# Patient Record
Sex: Female | Born: 1965
Health system: Southern US, Community
[De-identification: ages and names within clinical notes are randomized; demographics above are authoritative.]

## PROBLEM LIST (undated history)

## (undated) DIAGNOSIS — E669 Obesity, unspecified: Secondary | ICD-10-CM

## (undated) DIAGNOSIS — I519 Heart disease, unspecified: Secondary | ICD-10-CM

## (undated) DIAGNOSIS — E785 Hyperlipidemia, unspecified: Secondary | ICD-10-CM

## (undated) DIAGNOSIS — I2542 Coronary artery dissection: Secondary | ICD-10-CM

## (undated) DIAGNOSIS — I10 Essential (primary) hypertension: Secondary | ICD-10-CM

## (undated) DIAGNOSIS — K219 Gastro-esophageal reflux disease without esophagitis: Secondary | ICD-10-CM

## (undated) DIAGNOSIS — I209 Angina pectoris, unspecified: Secondary | ICD-10-CM

## (undated) DIAGNOSIS — D509 Iron deficiency anemia, unspecified: Secondary | ICD-10-CM

## (undated) HISTORY — DX: Obesity, unspecified: E66.9

## (undated) HISTORY — PX: HYSTEROSCOPY W/ ENDOMETRIAL ABLATION: SUR665

## (undated) HISTORY — DX: Coronary artery dissection: I25.42

## (undated) HISTORY — DX: Iron deficiency anemia, unspecified: D50.9

## (undated) HISTORY — DX: Hyperlipidemia, unspecified: E78.5

## (undated) HISTORY — PX: TUBAL LIGATION: SHX77

## (undated) HISTORY — DX: Heart disease, unspecified: I51.9

## (undated) HISTORY — PX: CARDIAC SURGERY: SHX584

## (undated) HISTORY — PX: DILATION AND CURETTAGE OF UTERUS: SHX78

---

## 2005-09-16 ENCOUNTER — Ambulatory Visit: Payer: Self-pay | Admitting: Unknown Physician Specialty

## 2005-11-28 ENCOUNTER — Encounter: Admission: RE | Admit: 2005-11-28 | Discharge: 2005-11-28 | Payer: Self-pay | Admitting: Occupational Medicine

## 2006-12-01 ENCOUNTER — Emergency Department (HOSPITAL_COMMUNITY): Admission: EM | Admit: 2006-12-01 | Discharge: 2006-12-01 | Payer: Self-pay | Admitting: Emergency Medicine

## 2008-04-12 ENCOUNTER — Other Ambulatory Visit: Admission: RE | Admit: 2008-04-12 | Discharge: 2008-04-12 | Payer: Self-pay | Admitting: Obstetrics and Gynecology

## 2009-05-25 ENCOUNTER — Other Ambulatory Visit: Admission: RE | Admit: 2009-05-25 | Discharge: 2009-05-25 | Payer: Self-pay | Admitting: Obstetrics and Gynecology

## 2010-10-23 ENCOUNTER — Emergency Department (HOSPITAL_COMMUNITY)
Admission: EM | Admit: 2010-10-23 | Discharge: 2010-10-23 | Disposition: A | Discharge status: Other Acute Inpatient Hospital | Payer: BC Managed Care – PPO | Source: Home / Self Care | Attending: Emergency Medicine | Admitting: Emergency Medicine

## 2010-10-23 ENCOUNTER — Emergency Department (HOSPITAL_COMMUNITY): Payer: BC Managed Care – PPO

## 2010-10-23 ENCOUNTER — Other Ambulatory Visit: Payer: Self-pay

## 2010-10-23 ENCOUNTER — Inpatient Hospital Stay (HOSPITAL_COMMUNITY)
Admission: EM | Admit: 2010-10-23 | Discharge: 2010-10-29 | DRG: 546 | Disposition: A | Discharge status: Home / Self Care | Payer: BC Managed Care – PPO | Source: Other Acute Inpatient Hospital | Attending: Internal Medicine | Admitting: Internal Medicine

## 2010-10-23 ENCOUNTER — Emergency Department: Payer: Self-pay | Admitting: Emergency Medicine

## 2010-10-23 ENCOUNTER — Encounter: Payer: Self-pay | Admitting: *Deleted

## 2010-10-23 DIAGNOSIS — E669 Obesity, unspecified: Secondary | ICD-10-CM | POA: Diagnosis present

## 2010-10-23 DIAGNOSIS — T45515A Adverse effect of anticoagulants, initial encounter: Secondary | ICD-10-CM | POA: Diagnosis not present

## 2010-10-23 DIAGNOSIS — D689 Coagulation defect, unspecified: Secondary | ICD-10-CM | POA: Diagnosis not present

## 2010-10-23 DIAGNOSIS — I10 Essential (primary) hypertension: Secondary | ICD-10-CM | POA: Diagnosis present

## 2010-10-23 DIAGNOSIS — D62 Acute posthemorrhagic anemia: Secondary | ICD-10-CM | POA: Diagnosis not present

## 2010-10-23 DIAGNOSIS — I214 Non-ST elevation (NSTEMI) myocardial infarction: Secondary | ICD-10-CM | POA: Insufficient documentation

## 2010-10-23 DIAGNOSIS — D509 Iron deficiency anemia, unspecified: Secondary | ICD-10-CM | POA: Diagnosis present

## 2010-10-23 DIAGNOSIS — Z8249 Family history of ischemic heart disease and other diseases of the circulatory system: Secondary | ICD-10-CM

## 2010-10-23 DIAGNOSIS — E876 Hypokalemia: Secondary | ICD-10-CM | POA: Diagnosis present

## 2010-10-23 DIAGNOSIS — R079 Chest pain, unspecified: Secondary | ICD-10-CM

## 2010-10-23 DIAGNOSIS — E8779 Other fluid overload: Secondary | ICD-10-CM | POA: Diagnosis not present

## 2010-10-23 DIAGNOSIS — I2109 ST elevation (STEMI) myocardial infarction involving other coronary artery of anterior wall: Secondary | ICD-10-CM | POA: Diagnosis present

## 2010-10-23 DIAGNOSIS — I2542 Coronary artery dissection: Principal | ICD-10-CM | POA: Diagnosis present

## 2010-10-23 DIAGNOSIS — J45909 Unspecified asthma, uncomplicated: Secondary | ICD-10-CM | POA: Diagnosis present

## 2010-10-23 HISTORY — DX: Essential (primary) hypertension: I10

## 2010-10-23 LAB — BASIC METABOLIC PANEL
BUN: 15 mg/dL (ref 6–23)
CO2: 27 mEq/L (ref 19–32)
Calcium: 9.2 mg/dL (ref 8.4–10.5)
Glucose, Bld: 104 mg/dL — ABNORMAL HIGH (ref 70–99)
Sodium: 135 mEq/L (ref 135–145)

## 2010-10-23 LAB — CBC
MCH: 29.5 pg (ref 26.0–34.0)
MCV: 86.3 fL (ref 78.0–100.0)
Platelets: 310 10*3/uL (ref 150–400)
RBC: 3.87 MIL/uL (ref 3.87–5.11)

## 2010-10-23 LAB — DIFFERENTIAL
Eosinophils Absolute: 0.2 10*3/uL (ref 0.0–0.7)
Eosinophils Relative: 4 % (ref 0–5)
Lymphs Abs: 1.2 10*3/uL (ref 0.7–4.0)
Monocytes Relative: 10 % (ref 3–12)

## 2010-10-23 LAB — PROTIME-INR: Prothrombin Time: 13.8 seconds (ref 11.6–15.2)

## 2010-10-23 LAB — POCT I-STAT TROPONIN I

## 2010-10-23 LAB — CK TOTAL AND CKMB (NOT AT ARMC)
CK, MB: 3.3 ng/mL (ref 0.3–4.0)
Relative Index: 2.3 (ref 0.0–2.5)

## 2010-10-23 MED ORDER — ASPIRIN 81 MG PO CHEW
324.0000 mg | CHEWABLE_TABLET | Freq: Once | ORAL | Status: AC
  Administered 2010-10-23: 324 mg via ORAL
  Filled 2010-10-23: qty 4

## 2010-10-23 MED ORDER — NITROGLYCERIN 0.4 MG SL SUBL
0.4000 mg | SUBLINGUAL_TABLET | SUBLINGUAL | Status: AC | PRN
  Administered 2010-10-23 (×3): 0.4 mg via SUBLINGUAL
  Filled 2010-10-23: qty 75

## 2010-10-23 MED ORDER — HEPARIN BOLUS VIA INFUSION
5000.0000 [IU] | Freq: Once | INTRAVENOUS | Status: AC
  Administered 2010-10-23: 5000 [IU] via INTRAVENOUS
  Filled 2010-10-23: qty 5000

## 2010-10-23 MED ORDER — HEPARIN (PORCINE) IN NACL 100-0.45 UNIT/ML-% IJ SOLN
12.0000 [IU]/kg/h | INTRAMUSCULAR | Status: DC
  Administered 2010-10-23: 12 [IU]/kg/h via INTRAVENOUS
  Filled 2010-10-23: qty 250

## 2010-10-23 MED ORDER — METOPROLOL TARTRATE 1 MG/ML IV SOLN
2.5000 mg | Freq: Once | INTRAVENOUS | Status: AC
  Administered 2010-10-23: 16:00:00 via INTRAVENOUS
  Filled 2010-10-23: qty 5

## 2010-10-23 NOTE — ED Provider Notes (Addendum)
History   Scribed for Vida Roller, MD, the patient was seen in room APA11/APA11. This chart was scribed by Clarita Crane. This patient's care was started at 1:29PM.   CSN: [id number] Arrival date & time: 10/23/2010 12:54 PM  Chief Complaint  Patient presents with  . Chest Pain  . Shortness of Breath   The history is provided by the patient and the spouse.   Ashlee Stein is a 45 y.o. female who presents to the Emergency Department complaining of episodic chest pain radiating through chest to back described as pressure with associated SOB onset 2.5 hours ago. States chest pain came on suddenly while she was walking from her car to her home with episode lasting 20-30 minutes then gradually improving. Notes chest pain has resolved but is still experiencing pressure in her back. Patient reports SOB was worse with exertion. Patient denies h/o of similar symptoms. Denies cough, fever, n/v. Notes h/o hypertension, iron deficiency. Family h/o pre-mature cardiac problems. Patient is a non-drinker, non-smoker.   Past Medical History  Diagnosis Date  . Hypertension     History reviewed. No pertinent past surgical history.  Family History  Problem Relation Age of Onset  . Heart attack Sister     History  Substance Use Topics  . Smoking status: Never Smoker   . Smokeless tobacco: Not on file  . Alcohol Use: No    OB History    Grav Para Term Preterm Abortions TAB SAB Ect Mult Living                  Review of Systems 10 Systems reviewed and are negative for acute change except as noted in the HPI.  Physical Exam  BP 122/76  Pulse 65  Temp(Src) 98 F (36.7 C) (Oral)  Resp 14  Ht 5' 7.5" (1.715 m)  Wt 247 lb (112.038 kg)  BMI 38.11 kg/m2  SpO2 99%  LMP 10/20/2010  Physical Exam  Nursing note and vitals reviewed. Constitutional: She is oriented to person, place, and time. She appears well-developed and well-nourished. No distress.  HENT:  Head: Normocephalic and  atraumatic.  Eyes: Conjunctivae are normal. Pupils are equal, round, and reactive to light.  Neck: Neck supple.  Cardiovascular: Normal rate and regular rhythm.  Exam reveals no gallop and no friction rub.   No murmur heard. Pulmonary/Chest: Effort normal and breath sounds normal. She has no wheezes.       Mild reproducible chest tenderness at right rib margin below breast.   Abdominal: Soft. Bowel sounds are normal. She exhibits no distension. There is no tenderness.  Musculoskeletal: Normal range of motion. She exhibits no edema.  Neurological: She is alert and oriented to person, place, and time. No sensory deficit.  Skin: Skin is warm and dry.  Psychiatric: She has a normal mood and affect. Her behavior is normal.    ED Course  CRITICAL CARE Performed by: Eber Hong D Authorized by: Eber Hong D Total critical care time: 35 minutes Critical care time was exclusive of separately billable procedures and treating other patients and teaching time. Critical care was necessary to treat or prevent imminent or life-threatening deterioration of the following conditions: Acute Heart Attack. Critical care was time spent personally by me on the following activities: evaluation of patient's response to treatment, ordering and performing treatments and interventions, pulse oximetry, examination of patient, ordering and review of laboratory studies, re-evaluation of patient's condition, discussions with consultants, obtaining history from patient or surrogate, review of  old charts and ordering and review of radiographic studies. Comments: Heparin gtt, nitro, lopressor, cardiology consult and transfer to cardiac referral center.    MDM Patient had 30+ minutes of chest pressure with radiation to the back. Still having some mild pressure in the back. Vital signs are normal exam is otherwise unremarkable. Her EKG shows left ventricular hypertrophy but no signs of acute ischemia. Initiate  laboratory, imaging, medication workup.  Results for orders placed during the hospital encounter of 10/23/10  TROPONIN I      Component Value Range   Troponin I 0.37 (*) <0.30 (ng/mL)  BASIC METABOLIC PANEL      Component Value Range   Sodium 135  135 - 145 (mEq/L)   Potassium 3.4 (*) 3.5 - 5.1 (mEq/L)   Chloride 99  96 - 112 (mEq/L)   CO2 27  19 - 32 (mEq/L)   Glucose, Bld 104 (*) 70 - 99 (mg/dL)   BUN 15  6 - 23 (mg/dL)   Creatinine, Ser 0.34  0.50 - 1.10 (mg/dL)   Calcium 9.2  8.4 - 74.2 (mg/dL)   GFR calc non Af Amer >60  >60 (mL/min)   GFR calc Af Amer >60  >60 (mL/min)  CBC      Component Value Range   WBC 4.4  4.0 - 10.5 (K/uL)   RBC 3.87  3.87 - 5.11 (MIL/uL)   Hemoglobin 11.4 (*) 12.0 - 15.0 (g/dL)   HCT 59.5 (*) 63.8 - 46.0 (%)   MCV 86.3  78.0 - 100.0 (fL)   MCH 29.5  26.0 - 34.0 (pg)   MCHC 34.1  30.0 - 36.0 (g/dL)   RDW 75.6  43.3 - 29.5 (%)   Platelets 310  150 - 400 (K/uL)  DIFFERENTIAL      Component Value Range   Neutrophils Relative 59  43 - 77 (%)   Neutro Abs 2.6  1.7 - 7.7 (K/uL)   Lymphocytes Relative 27  12 - 46 (%)   Lymphs Abs 1.2  0.7 - 4.0 (K/uL)   Monocytes Relative 10  3 - 12 (%)   Monocytes Absolute 0.4  0.1 - 1.0 (K/uL)   Eosinophils Relative 4  0 - 5 (%)   Eosinophils Absolute 0.2  0.0 - 0.7 (K/uL)   Basophils Relative 1  0 - 1 (%)   Basophils Absolute 0.0  0.0 - 0.1 (K/uL)  PROTIME-INR      Component Value Range   Prothrombin Time 13.8  11.6 - 15.2 (seconds)   INR 1.04  0.00 - 1.49   APTT      Component Value Range   aPTT 30  24 - 37 (seconds)  POCT I-STAT TROPONIN I      Component Value Range   Troponin i, poc 0.17 (*) 0.00 - 0.08 (ng/mL)   Comment NOTIFIED PHYSICIAN     Comment 3             Dg Chest 2 View  10/23/2010  *RADIOLOGY REPORT*  Clinical Data: Chest pain, nonsmoker  CHEST - 2 VIEW  Comparison: 12/01/2006  Findings: Lungs are clear. No pleural effusion or pneumothorax.  Cardiomediastinal silhouette is within normal  limits.  Mild degenerative changes of the visualized thoracolumbar spine.  IMPRESSION: Normal chest radiographs.  Original Report Authenticated By: Charline Bills, M.D.     ED ECG REPORT   Date: 10/23/2010 13:00 PM  Rate: 63  Rhythm: normal sinus rhythm  QRS Axis: normal  Intervals: normal  ST/T Wave abnormalities:  normal  Conduction Disutrbances:none  Narrative Interpretation: LVH  Old EKG Reviewed: none available  ED ECG REPORT   Date: 10/23/2010 15:46  Rate: 86  Rhythm: normal sinus rhythm  QRS Axis: normal  Intervals: normal  ST/T Wave abnormalities: normal  Conduction Disutrbances:none  Narrative Interpretation: no change  Old EKG Reviewed: unchanged  Pt reexamined and has minimal back pain at this time - states the back pain got a little worse with standing for her CXR -no CP - repeat EKG without acute findings, Trop elevated c/w ACS.  CC provided,  Heparin ordered, cardiology consulrted for transfer to referral center.  D/w Dr. Gala Romney with Cardiology at [facility] - agrees with heparin and lopressor 2.5 at his request.  CC provided.  I personally performed the services described in this documentation, which was scribed in my presence. The recorded information has been reviewed and considered. Vida Roller, MD

## 2010-10-23 NOTE — ED Notes (Signed)
Chest pain that started today, pt states "the pain came from out of nowhere", sob when the pain comes, pt states that the pain comes and goes, pain does radiate to right arm

## 2010-10-24 ENCOUNTER — Inpatient Hospital Stay (HOSPITAL_COMMUNITY): Payer: BC Managed Care – PPO

## 2010-10-24 DIAGNOSIS — I2510 Atherosclerotic heart disease of native coronary artery without angina pectoris: Secondary | ICD-10-CM

## 2010-10-24 DIAGNOSIS — I2542 Coronary artery dissection: Secondary | ICD-10-CM

## 2010-10-24 HISTORY — PX: CORONARY ARTERY BYPASS GRAFT: SHX141

## 2010-10-24 HISTORY — DX: Coronary artery dissection: I25.42

## 2010-10-24 LAB — POCT I-STAT 4, (NA,K, GLUC, HGB,HCT)
Glucose, Bld: 96 mg/dL (ref 70–99)
Glucose, Bld: 117 mg/dL — ABNORMAL HIGH (ref 70–99)
Glucose, Bld: 130 mg/dL — ABNORMAL HIGH (ref 70–99)
HCT: 25 % — ABNORMAL LOW (ref 36.0–46.0)
HCT: 26 % — ABNORMAL LOW (ref 36.0–46.0)
HCT: 27 % — ABNORMAL LOW (ref 36.0–46.0)
Hemoglobin: 8.5 g/dL — ABNORMAL LOW (ref 12.0–15.0)
Hemoglobin: 8.5 g/dL — ABNORMAL LOW (ref 12.0–15.0)
Potassium: 3.3 mEq/L — ABNORMAL LOW (ref 3.5–5.1)
Potassium: 3.8 mEq/L (ref 3.5–5.1)
Potassium: 4.6 mEq/L (ref 3.5–5.1)
Sodium: 136 mEq/L (ref 135–145)
Sodium: 136 mEq/L (ref 135–145)

## 2010-10-24 LAB — POCT I-STAT 3, ART BLOOD GAS (G3+)
Acid-base deficit: 1 mmol/L (ref 0.0–2.0)
Acid-base deficit: 2 mmol/L (ref 0.0–2.0)
Bicarbonate: 23.1 mEq/L (ref 20.0–24.0)
Bicarbonate: 23.8 mEq/L (ref 20.0–24.0)
Bicarbonate: 24.2 mEq/L — ABNORMAL HIGH (ref 20.0–24.0)
O2 Saturation: 100 %
O2 Saturation: 98 %
TCO2: 24 mmol/L (ref 0–100)
TCO2: 26 mmol/L (ref 0–100)
pCO2 arterial: 46.2 mmHg — ABNORMAL HIGH (ref 35.0–45.0)
pH, Arterial: 7.327 — ABNORMAL LOW (ref 7.350–7.400)
pO2, Arterial: 106 mmHg — ABNORMAL HIGH (ref 80.0–100.0)
pO2, Arterial: 308 mmHg — ABNORMAL HIGH (ref 80.0–100.0)
pO2, Arterial: 351 mmHg — ABNORMAL HIGH (ref 80.0–100.0)

## 2010-10-24 LAB — BASIC METABOLIC PANEL
Calcium: 9 mg/dL (ref 8.4–10.5)
Creatinine, Ser: 0.9 mg/dL (ref 0.50–1.10)
GFR calc non Af Amer: >60 mL/min (ref >60)
Glucose, Bld: 115 mg/dL — ABNORMAL HIGH (ref 70–99)
Sodium: 137 mEq/L (ref 135–145)

## 2010-10-24 LAB — TROPONIN I
Troponin I: <0.3 ng/mL (ref <0.30)
Troponin I: <0.3 ng/mL (ref <0.30)

## 2010-10-24 LAB — PLATELET COUNT: Platelets: 195 10*3/uL (ref 150–400)

## 2010-10-24 LAB — LIPID PANEL
LDL Cholesterol: 108 mg/dL — ABNORMAL HIGH (ref 0–99)
Triglycerides: 93 mg/dL (ref <150)

## 2010-10-24 LAB — PREGNANCY, URINE: Preg Test, Ur: NEGATIVE

## 2010-10-24 LAB — HEMOGLOBIN A1C
Hgb A1c MFr Bld: 5.8 % — ABNORMAL HIGH (ref <5.7)
Mean Plasma Glucose: 120 mg/dL — ABNORMAL HIGH (ref <117)

## 2010-10-24 LAB — HEMOGLOBIN AND HEMATOCRIT, BLOOD: HCT: 26.2 % — ABNORMAL LOW (ref 36.0–46.0)

## 2010-10-24 LAB — HEPARIN LEVEL (UNFRACTIONATED): Heparin Unfractionated: 0.52 IU/mL (ref 0.30–0.70)

## 2010-10-24 LAB — CBC
HCT: 32.4 % — ABNORMAL LOW (ref 36.0–46.0)
Hemoglobin: 10.9 g/dL — ABNORMAL LOW (ref 12.0–15.0)
RDW: 14.3 % (ref 11.5–15.5)
WBC: 5.1 10*3/uL (ref 4.0–10.5)

## 2010-10-24 LAB — CK TOTAL AND CKMB (NOT AT ARMC)
CK, MB: 3.2 ng/mL (ref 0.3–4.0)
Total CK: 136 U/L (ref 7–177)
Total CK: 128 U/L (ref 7–177)

## 2010-10-25 ENCOUNTER — Inpatient Hospital Stay (HOSPITAL_COMMUNITY): Payer: BC Managed Care – PPO

## 2010-10-25 DIAGNOSIS — IMO0001 Reserved for inherently not codable concepts without codable children: Secondary | ICD-10-CM

## 2010-10-25 DIAGNOSIS — E1165 Type 2 diabetes mellitus with hyperglycemia: Secondary | ICD-10-CM

## 2010-10-25 DIAGNOSIS — I319 Disease of pericardium, unspecified: Secondary | ICD-10-CM

## 2010-10-25 LAB — CBC
HCT: 26.9 % — ABNORMAL LOW (ref 36.0–46.0)
HCT: 26 % — ABNORMAL LOW (ref 36.0–46.0)
HCT: 27.5 % — ABNORMAL LOW (ref 36.0–46.0)
Hemoglobin: 9.5 g/dL — ABNORMAL LOW (ref 12.0–15.0)
Hemoglobin: 9 g/dL — ABNORMAL LOW (ref 12.0–15.0)
Hemoglobin: 9.6 g/dL — ABNORMAL LOW (ref 12.0–15.0)
MCH: 30 pg (ref 26.0–34.0)
MCH: 29.3 pg (ref 26.0–34.0)
MCH: 29.4 pg (ref 26.0–34.0)
MCHC: 35.3 g/dL (ref 30.0–36.0)
MCHC: 34.6 g/dL (ref 30.0–36.0)
MCHC: 34.9 g/dL (ref 30.0–36.0)
MCV: 84.9 fL (ref 78.0–100.0)
MCV: 84.7 fL (ref 78.0–100.0)
MCV: 84.4 fL (ref 78.0–100.0)
Platelets: 165 10*3/uL (ref 150–400)
Platelets: 227 10*3/uL (ref 150–400)
Platelets: 255 10*3/uL (ref 150–400)
RBC: 3.17 MIL/uL — ABNORMAL LOW (ref 3.87–5.11)
RBC: 3.07 MIL/uL — ABNORMAL LOW (ref 3.87–5.11)
RBC: 3.26 MIL/uL — ABNORMAL LOW (ref 3.87–5.11)
RDW: 14 % (ref 11.5–15.5)
RDW: 14.1 % (ref 11.5–15.5)
RDW: 14.4 % (ref 11.5–15.5)
WBC: 12.9 10*3/uL — ABNORMAL HIGH (ref 4.0–10.5)
WBC: 11.7 10*3/uL — ABNORMAL HIGH (ref 4.0–10.5)
WBC: 12.2 10*3/uL — ABNORMAL HIGH (ref 4.0–10.5)

## 2010-10-25 LAB — CREATININE, SERUM
Creatinine, Ser: 0.9 mg/dL (ref 0.50–1.10)
GFR calc Af Amer: >60 mL/min (ref >60)
GFR calc non Af Amer: >60 mL/min (ref >60)

## 2010-10-25 LAB — POCT I-STAT, CHEM 8
BUN: 6 mg/dL (ref 6–23)
BUN: 9 mg/dL (ref 6–23)
Calcium, Ion: 1.06 mmol/L — ABNORMAL LOW (ref 1.12–1.32)
Chloride: 102 mEq/L (ref 96–112)
Creatinine, Ser: 0.8 mg/dL (ref 0.50–1.10)
Creatinine, Ser: 0.9 mg/dL (ref 0.50–1.10)
Glucose, Bld: 161 mg/dL — ABNORMAL HIGH (ref 70–99)
Potassium: 3.5 mEq/L (ref 3.5–5.1)
Sodium: 139 mEq/L (ref 135–145)
TCO2: 22 mmol/L (ref 0–100)

## 2010-10-25 LAB — CARDIAC PANEL(CRET KIN+CKTOT+MB+TROPI)
CK, MB: 11 ng/mL (ref 0.3–4.0)
Relative Index: 3.4 — ABNORMAL HIGH (ref 0.0–2.5)
Total CK: 325 U/L — ABNORMAL HIGH (ref 7–177)
Troponin I: 3.97 ng/mL (ref <0.30)

## 2010-10-25 LAB — POCT I-STAT 3, ART BLOOD GAS (G3+)
Acid-base deficit: 1 mmol/L (ref 0.0–2.0)
Acid-base deficit: 1 mmol/L (ref 0.0–2.0)
Bicarbonate: 22.8 mEq/L (ref 20.0–24.0)
Bicarbonate: 23.5 mEq/L (ref 20.0–24.0)
Bicarbonate: 23.9 mEq/L (ref 20.0–24.0)
O2 Saturation: 99 %
Patient temperature: 35.5
Patient temperature: 97.5
TCO2: 24 mmol/L (ref 0–100)
pCO2 arterial: 40.3 mmHg (ref 35.0–45.0)
pH, Arterial: 7.376 (ref 7.350–7.400)
pO2, Arterial: 98 mmHg (ref 80.0–100.0)
pO2, Arterial: 102 mmHg — ABNORMAL HIGH (ref 80.0–100.0)

## 2010-10-25 LAB — GLUCOSE, CAPILLARY
Glucose-Capillary: 113 mg/dL — ABNORMAL HIGH (ref 70–99)
Glucose-Capillary: 100 mg/dL — ABNORMAL HIGH (ref 70–99)
Glucose-Capillary: 119 mg/dL — ABNORMAL HIGH (ref 70–99)
Glucose-Capillary: 112 mg/dL — ABNORMAL HIGH (ref 70–99)
Glucose-Capillary: 120 mg/dL — ABNORMAL HIGH (ref 70–99)
Glucose-Capillary: 111 mg/dL — ABNORMAL HIGH (ref 70–99)
Glucose-Capillary: 135 mg/dL — ABNORMAL HIGH (ref 70–99)
Glucose-Capillary: 145 mg/dL — ABNORMAL HIGH (ref 70–99)
Glucose-Capillary: 122 mg/dL — ABNORMAL HIGH (ref 70–99)

## 2010-10-25 LAB — BASIC METABOLIC PANEL
BUN: 8 mg/dL (ref 6–23)
CO2: 25 mEq/L (ref 19–32)
Calcium: 7.3 mg/dL — ABNORMAL LOW (ref 8.4–10.5)
Chloride: 108 mEq/L (ref 96–112)
Creatinine, Ser: 0.78 mg/dL (ref 0.50–1.10)
GFR calc Af Amer: >60 mL/min (ref >60)
GFR calc non Af Amer: >60 mL/min (ref >60)
Glucose, Bld: 117 mg/dL — ABNORMAL HIGH (ref 70–99)
Potassium: 3.9 mEq/L (ref 3.5–5.1)
Sodium: 138 mEq/L (ref 135–145)

## 2010-10-25 LAB — MRSA PCR SCREENING: MRSA by PCR: NEGATIVE

## 2010-10-25 LAB — MAGNESIUM
Magnesium: 2.7 mg/dL — ABNORMAL HIGH (ref 1.5–2.5)
Magnesium: 2.5 mg/dL (ref 1.5–2.5)

## 2010-10-25 LAB — APTT: aPTT: 35 seconds (ref 24–37)

## 2010-10-25 LAB — PROTIME-INR
INR: 1.47 (ref 0.00–1.49)
Prothrombin Time: 18.1 seconds — ABNORMAL HIGH (ref 11.6–15.2)

## 2010-10-26 ENCOUNTER — Inpatient Hospital Stay (HOSPITAL_COMMUNITY): Payer: BC Managed Care – PPO

## 2010-10-26 LAB — GLUCOSE, CAPILLARY
Glucose-Capillary: 128 mg/dL — ABNORMAL HIGH (ref 70–99)
Glucose-Capillary: 139 mg/dL — ABNORMAL HIGH (ref 70–99)
Glucose-Capillary: 126 mg/dL — ABNORMAL HIGH (ref 70–99)

## 2010-10-26 LAB — BASIC METABOLIC PANEL
CO2: 27 mEq/L (ref 19–32)
Chloride: 102 mEq/L (ref 96–112)
Creatinine, Ser: 0.97 mg/dL (ref 0.50–1.10)

## 2010-10-26 LAB — PREPARE FRESH FROZEN PLASMA
Unit division: 0
Unit division: 0

## 2010-10-26 LAB — PREPARE PLATELET PHERESIS

## 2010-10-26 LAB — HEMOGLOBIN A1C
Hgb A1c MFr Bld: 5.4 % (ref <5.7)
Mean Plasma Glucose: 108 mg/dL (ref <117)

## 2010-10-26 LAB — CBC
MCV: 85.7 fL (ref 78.0–100.0)
Platelets: 246 10*3/uL (ref 150–400)
RBC: 3.01 MIL/uL — ABNORMAL LOW (ref 3.87–5.11)
WBC: 12.2 10*3/uL — ABNORMAL HIGH (ref 4.0–10.5)

## 2010-10-26 LAB — MAGNESIUM: Magnesium: 2.6 mg/dL — ABNORMAL HIGH (ref 1.5–2.5)

## 2010-10-27 ENCOUNTER — Inpatient Hospital Stay (HOSPITAL_COMMUNITY): Payer: BC Managed Care – PPO

## 2010-10-27 LAB — CBC
HCT: 23.9 % — ABNORMAL LOW (ref 36.0–46.0)
Hemoglobin: 8.2 g/dL — ABNORMAL LOW (ref 12.0–15.0)
MCH: 29.7 pg (ref 26.0–34.0)
MCHC: 34.3 g/dL (ref 30.0–36.0)
MCV: 86.6 fL (ref 78.0–100.0)
Platelets: 247 10*3/uL (ref 150–400)
RBC: 2.76 MIL/uL — ABNORMAL LOW (ref 3.87–5.11)
RDW: 15.2 % (ref 11.5–15.5)
WBC: 10.6 10*3/uL — ABNORMAL HIGH (ref 4.0–10.5)

## 2010-10-27 LAB — BASIC METABOLIC PANEL
BUN: 16 mg/dL (ref 6–23)
CO2: 25 mEq/L (ref 19–32)
Calcium: 8.2 mg/dL — ABNORMAL LOW (ref 8.4–10.5)
Chloride: 101 mEq/L (ref 96–112)
Creatinine, Ser: 0.93 mg/dL (ref 0.50–1.10)
GFR calc Af Amer: >60 mL/min (ref >60)
GFR calc non Af Amer: >60 mL/min (ref >60)
Glucose, Bld: 122 mg/dL — ABNORMAL HIGH (ref 70–99)
Potassium: 3.7 mEq/L (ref 3.5–5.1)
Sodium: 135 mEq/L (ref 135–145)

## 2010-10-27 LAB — TYPE AND SCREEN
Unit division: 0
Unit division: 0

## 2010-10-27 LAB — GLUCOSE, CAPILLARY
Glucose-Capillary: 118 mg/dL — ABNORMAL HIGH (ref 70–99)
Glucose-Capillary: 128 mg/dL — ABNORMAL HIGH (ref 70–99)
Glucose-Capillary: 99 mg/dL (ref 70–99)
Glucose-Capillary: 104 mg/dL — ABNORMAL HIGH (ref 70–99)

## 2010-10-29 LAB — BASIC METABOLIC PANEL
BUN: 15 mg/dL (ref 6–23)
Chloride: 104 mEq/L (ref 96–112)
Creatinine, Ser: 0.91 mg/dL (ref 0.50–1.10)
GFR calc Af Amer: >60 mL/min (ref >60)

## 2010-11-06 ENCOUNTER — Telehealth: Payer: Self-pay | Admitting: Internal Medicine

## 2010-11-06 NOTE — Cardiovascular Report (Signed)
NAME:  Ashlee Stein, Ashlee Stein        ACCOUNT NO.:  [id number]  MEDICAL RECORD NO.:  [id number]  LOCATION:  2399                         FACILITY:  [facility]  PHYSICIAN:  Arturo Morton. Riley Kill, MD, FACCDATE OF BIRTH:  January 02, 1966  DATE OF PROCEDURE:  10/24/2010 DATE OF DISCHARGE:                           CARDIAC CATHETERIZATION   INDICATIONS:  The patient presented with chest pain and had elevation of her troponin's.  She has no definite risk factors.  As a result, urgent catheterization was recommended by Dr. Gala Romney and Clifton James.  PROCEDURES: 1. Left heart catheterization. 2. Selective coronary arteriography. 3. Selective left ventriculography. 4. Attempted percutaneous coronary intervention of the left anterior     descending artery.  DESCRIPTION OF PROCEDURE:  The patient was brought to the catheterization laboratory and prepped and draped in the usual fashion. Through an anterior puncture, the right radial artery was entered.  A 5- French sheath was initially placed.  Views of the right and left coronary arteries were obtained in multiple angiographic projections. We were unable to cross the aortic valve.  I stopped at this point, and I called Dr. Excell Seltzer and Dr. Clifton James in the office.  They reviewed the films with me.  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  There is slight hypodensity up around the first turn in a highly tortuous LAD.  We discussed the various options which included watchful waiting versus an attempt across this area and placed the stent.  We also discussed the possibility of revascularization surgery, although the distal vessel is of moderate size, being probably about a 2.25-mm artery, although it does cross the apical tip.  We have had a moderate experience with spontaneous coronary artery dissection, with proximal progressive extension of hematoma,  and with this we felt the best option might be to see if we could cross the distal vessel with a wire, and potentially place a long drug-eluting stent in this portion of the vessel.  I discussed this with the patient, then subsequently went out and spoke with her family.  Preparations were made.  We specifically did not give her Brillianta or prasugrel, in part because of the clear-cut tortuosity and the potential need for revascularization surgery.  We thought we would wait until we were able to cross with the wire.  The patient had been treated with aspirin. Bivalirudin was given according to protocol and ACT was checked and thought to be appropriate for coronary artery intervention.  We were able to get a traverse wire down into the LAD, but because of the excessive tortuosity it was somewhat difficult to move.  We placed in extension wire, and subsequently placed an over the wire balloon 2-mm in length down into the midportion of the vessel.  This gave additional support and we had to reshape the wire and a long traverse wire was then placed with a bend on the tip.  With this, we were able to get down to and about the area of the dissection and down in this, although we had some difficulty then advancing beyond the area of the clear-cut spontaneous dissection which appeared to involve 2 planes with separation  of the lumen.  We tried to move the balloon around the excessive tortuosity.  No balloon inflations were performed.  Dr. Excell Seltzer came over from the office and we continued to review options.  The patient remained pain free throughout the procedure, and there was no ST elevation with the size of the distal vessel.  However, there was a modest apical vessel and I asked Dr. Kathlee Nations Trigt to come over and review the films with [place].  Although, the distal LAD is relatively small, it was apparent that we would have a difficult time getting a stent down the artery even if we were to  successfully cross.  There was also evidence of some more proximal dissection related to movement of the balloon in the area of tortuosity.  With some decremental flow, the patient remained pain free.  Dr. Excell Seltzer, Dr. Donata Clay and I discussed the options and Dr. Donata Clay felt that taking her to the operating room and grafting the distal LAD, still remained a good option.  As a result, we elected to proceed on with emergent revascularization surgery.  The patient remained stable and is being transferred.  Her 6-French radial sheath was sewn into place as we felt that the TR band would be difficult to gradually remove in the operating room on full dose heparin.  I will discuss this with the operating room team.  I reviewed the findings in detail with her mother and sister, they came back to the laboratory to see the patient prior to her transfer to the operating room.  HEMODYNAMIC DATA:  The central aortic pressure is 112/76 with a mean of 94.  ANGIOGRAPHIC DATA: 1. The right coronary artery has moderate amount of tortuosity, but is     free of critical disease. 2. The left main is relatively short appears to be free of critical     disease. 3. The left anterior descending artery courses to the apex.  The left     anterior descending artery has a near 90 degrees turn coming out of     the left main, and then has several bends prior to the area where     the disease is.  In the junction of the mid and distal vessel,     there is a fairly long area which starts with hypodensity at the     bend, this is followed by a long area of probably of about 25-30 mm     of spontaneously dissected vessel.  The distal end of this, there     is evidence of plain separation of the artery.  Beyond this, the     vessel actually opens up as about 2-mm artery and wraps the apical     tip.  The distal vessel, albeit relatively small at the apical tip     does appear to be a reasonable target for  grafting.  Following     attempts to get the wire down, there was decremental flow, and     evidence of more proximal dissection above the long area of     original plain, but the distal apical portion of the vessel     appeared to remain intact.  The dissection in the midvessel was     advanced beyond.  The findings at the origin of the case with more     proximal vessel was unaffected. 4. The circumflex coronary artery provides 2 large marginal branches     and the posterolateral  branch is free of critical disease.  CONCLUSIONS: 1. 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 2. Unsuccessful attempt to cross the distal LAD due to excessive     tortuosity and dissection with more proximal extension of the     dissection into the midvessel.  DISPOSITION:  The patient remained stable and without chest pain. Nonetheless, she has a clear-cut plain separation in her mid to distal portion of left anterior descending artery with a fairly large apical vessel.  What was clear from the attempt at percutaneous intervention was that we would be unsuccessful in getting a balloon down around the corner.  With these findings, we felt that the best option would be to go ahead as the patient is stable and allow the surgeons to revascularize the apical portion of the left anterior descending artery. Dr. Donata Clay thought this was possible, and thought it would be the optimal therapy.  Of note, our experience with the spontaneous dissection is that the hematoma has a tendency to extend proximally complicating the more proximal vessel and, therefore, maintaining the integrity of the apical vessel would be important.     Arturo Morton. Riley Kill, MD, [facility]    TDS/MEDQ  D:  10/24/2010  T:  10/24/2010  Job:  [id number]  Electronically Signed by Shawnie Pons MD [facility] on 11/06/2010 06:09:35 PM

## 2010-11-06 NOTE — Telephone Encounter (Signed)
Calling to confirm receipt of fax for cardiac rehab.  I confirmed that we have received it.

## 2010-11-06 NOTE — Telephone Encounter (Signed)
Pharmacy calling from [place]. Please return call.  Claim # [id number]

## 2010-11-08 NOTE — Telephone Encounter (Signed)
Pt calling needing refill of Tramadol 50mg  in [facility]. Please call in RX.

## 2010-11-08 NOTE — Telephone Encounter (Signed)
Not sure if Dr. Jones Broom refills Leotis Shames will send to his nurse

## 2010-11-12 NOTE — Telephone Encounter (Signed)
Pt states it has been refilled

## 2010-11-13 ENCOUNTER — Encounter: Payer: Self-pay | Admitting: Physician Assistant

## 2010-11-13 NOTE — H&P (Signed)
NAME:  Ashlee Stein, Ashlee Stein        ACCOUNT NO.:  [id number]  MEDICAL RECORD NO.:  [id number]  LOCATION:  2609                         FACILITY:  [facility]  PHYSICIAN:  Bevelyn Buckles. Dayron Odland, MDDATE OF BIRTH:  [birth year]  DATE OF ADMISSION:  10/23/2010 DATE OF DISCHARGE:                             HISTORY & PHYSICAL   PRIMARY CARDIOLOGIST:  New patient to [facility].  PRIMARY CARE PROVIDER:  Tesfaye D. Felecia Shelling, MD  CHIEF COMPLAINT:  Chest pain.  HISTORY OF PRESENT ILLNESS:  This is a 45 year old African American female with history of hypertension treated for the last five years as well as obesity and iron deficiency anemia, who presented to [facility] Emergency Department with complaints of acute onset of chest pain.  The patient states she had walked from her car back into her house and had acute onset of substernal chest pressure.  This feeling was associated with shortness of breath and diaphoresis.  She denies nausea or vomiting.  The pain was resolving on its own after 30-40 minutes of rest.  She has a positive family history for coronary artery disease; therefore, her husband brought her today to [facility] Emergency Department for further evaluation.  At time of presentation, the patient's chest pain had almost resolved, but some mild complaints of back pain remained, therefore the patient was given sublingual nitroglycerin.  This caused a headache, but not much  difference in her pain.  EKG showed no acute changes.  Troponin was mildly elevated at 0.37, therefore the patient was given a heparin bolus and placed on IV heparin, aspirin 324 mg, and Lopressor.  She has since been transferred to [facility] for possible cardiac catheterization.  While interviewing the patient, she currently is pain-free.  She states that she has not had an episode like this before today.  With further discussion, it appears she has been having intermittent pain under her right breast for the  last several months that she felt was related to the underwire in her bra.  She cannot correlate this pain with activity at this time.  The patient denies any recent fevers or chills, syncope, or change in bowel habits. Cardiology has been asked to admit the patient for further evaluation.  The patient states she is fairly active without complaints of chest pain.  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  PAST MEDICAL HISTORY: 1. Hypertension, treated for the last five years. 2. Iron deficiency anemia, on iron supplementation. 3. Obesity. 4. Status post wisdom teeth extraction.  SOCIAL HISTORY:  The patient lives with her husband and son in [place].  Her daughter is in college at [employer].  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  The patient denies tobacco or alcohol, as well as no illicit drug use.  FAMILY HISTORY:  Pertinent for premature coronary artery disease in her father, he passed away at the age of 82 from myocardial infarction.  She has a sibling who passed away at the age of 52 from congestive heart failure as well as a notable murmur, full details are not available. Her mother is alive with known  coronary artery disease and stents in her 45s.  ALLERGIES:  NO KNOWN DRUG ALLERGIES.  HOME MEDICATIONS: 1. Tylenol 500 mg p.r.n. 2. Ferrous sulfate 325 mg daily. 3. Lisinopril - HCTZ 20 - 25 mg 1 tablet daily. 4. Visine daily.  REVIEW OF SYSTEMS:  All pertinent positives and negatives as stated in HPI.  Other systems have been reviewed and are negative.  PHYSICAL EXAM:  VITAL SIGNS:  Pulse 65, respirations 14, blood pressure 116/56, O2 saturation 98% on room air. GENERAL:  This is an anxious-appearing young lady.  She is in no acute distress. HEENT:  Normal. NECK:  Supple without bruit or JVD. HEART:  Regular rate and rhythm with S1-S2.  No murmurs,  rubs, or gallop noted.  PMI is normal.  Pulses 2+ and equal bilaterally.  LUNGS:  Clear to auscultation bilaterally without wheezes, rales, or rhonchi. ABDOMEN: Soft, nontender, positive bowel sounds x4, obese. EXTREMITIES:  No clubbing or cyanosis.  There is trace bilateral lower extremity edema. MUSCULOSKELETAL:  No joint deformities or effusions. NEUROLOGIC:  Alert and oriented x3.  Cranial nerves II-XII grossly intact.  LABORATORY DATA:  Chest x-ray at [facility] without acute cardiopulmonary process.  EKG demonstrated normal sinus rhythm at a rate of 63 beats per minute with no acute changes, but nonspecific ST - T- wave changes.  Labs; WBC of 4.4, hemoglobin 11.4, hematocrit 33.4, platelet 310.  Sodium 135, potassium 3.4, BUN of 15, creatinine 0.95, INR 1.04, troponin I 0.37.  ASSESSMENT/PLAN:  This is a 46 year old African American female with history of hypertension and iron deficiency anemia who presents with several weeks' history of right breast/chest pain followed by profound episode of chest pain today.  This is worrisome for an acute coronary syndrome versus questionable coronary dissection, although the patient is currently chest pain-free.  The patient's EKG is without acute changes, but with mildly elevated troponin.  At this time, we feel the patient will need to proceed with cardiac catheterization in the morning, possibly sooner if her chest pain reappears.  Risks, benefits, and indications for cardiac catheterization were discussed with the patient in her family and they agreed to proceed.  At this time, she will be continued on heparin, aspirin, beta-blocker, and a low-dose statin will be added.  Further treatment will be dependent upon the above results.     Leonette Monarch, PA-C   ______________________________ Bevelyn Buckles. Alazay Leicht, MD    NB/MEDQ  D:  10/23/2010  T:  10/23/2010  Job:  [id number]  Electronically Signed by Alen Blew P.A. on  10/30/2010 11:37:10 AM Electronically Signed by Arvilla Meres MD on 11/13/2010 10:39:19 PM

## 2010-11-14 ENCOUNTER — Ambulatory Visit (INDEPENDENT_AMBULATORY_CARE_PROVIDER_SITE_OTHER)
Admission: RE | Admit: 2010-11-14 | Discharge: 2010-11-14 | Disposition: A | Discharge status: Home / Self Care | Payer: BC Managed Care – PPO | Source: Ambulatory Visit | Attending: Physician Assistant | Admitting: Physician Assistant

## 2010-11-14 ENCOUNTER — Ambulatory Visit (INDEPENDENT_AMBULATORY_CARE_PROVIDER_SITE_OTHER): Payer: BC Managed Care – PPO | Admitting: Physician Assistant

## 2010-11-14 ENCOUNTER — Encounter: Payer: Self-pay | Admitting: Physician Assistant

## 2010-11-14 VITALS — BP 130/88 | HR 83 | Ht 67.0 in | Wt 247.0 lb

## 2010-11-14 DIAGNOSIS — I10 Essential (primary) hypertension: Secondary | ICD-10-CM

## 2010-11-14 DIAGNOSIS — I2542 Coronary artery dissection: Secondary | ICD-10-CM | POA: Insufficient documentation

## 2010-11-14 DIAGNOSIS — I2510 Atherosclerotic heart disease of native coronary artery without angina pectoris: Secondary | ICD-10-CM

## 2010-11-14 DIAGNOSIS — E785 Hyperlipidemia, unspecified: Secondary | ICD-10-CM | POA: Insufficient documentation

## 2010-11-14 DIAGNOSIS — R079 Chest pain, unspecified: Secondary | ICD-10-CM

## 2010-11-14 MED ORDER — FAMOTIDINE 20 MG PO TABS: 20.0000 mg | ORAL_TABLET | Freq: Two times a day (BID) | ORAL | Status: DC

## 2010-11-14 NOTE — Progress Notes (Signed)
History of Present Illness: Primary Cardiologist:  Dr.  Revonda Standard Mcnabb is a 45 y.o. female who presents for post hospital follow up.  She was admitted 8/29-9/3.  She presented with chest pain and ruled in for a non-ST elevation myocardial infarction.  Cardiac catheterization 8/30: Mid to distal LAD spontaneous dissection, otherwise no CAD.  Echo 8/31: EF 55-60%, calcified AV annulus, mild to moderate TR, pericardial effusion.  There was an attempt made at PCI of her LAD.  However, it became apparent that revascularization surgery was necessary.  She was seen by Dr. Donata Clay And underwent single-vessel bypass with an SVG to the LAD.  She suffered acute blood loss anemia and was transfused with PRBCs.  She also received FFP and platelets.  She was treated with Lasix postoperatively for volume overload.  Otherwise, her postoperative course was uneventful.  Labs: TC 184, TG 93, HDL 57, LDL 108, discharge hemoglobin 8.2, sodium 138, potassium 3.3, creatinine 0.9, A1c 5.4  She has had some chest soreness.  She ate some soup at [employer] and had significant vomiting earlier this week.  She noted substernal chest pressure last night.  It has lasted until today.  It is better.  She notes some worsening with lying flat.  Also, notes worse with inspiration.  No radiation.  No dyspnea, nausea, diaphoresis.  No syncope.  She is sleeping on 3 pillows.  No PND or edema.   She is uncertain if pain is worse with eating.  She feels a skipping sensation sometimes.  Past Medical History  Diagnosis Date  . Hypertension   . Iron deficiency anemia   . Obesity   . Coronary artery dissection     NSTEMI 8/12: cath with mid to dist LAD spont. dissection, unable to do PCI and referred for CABG:  SVG to LAD (Dr. PVT)  . Dyslipidemia     Current Outpatient Prescriptions  Medication Sig Dispense Refill  . acetaminophen (TYLENOL) 500 MG tablet Take 500 mg by mouth as needed. For pain       . aspirin  325 MG tablet Take 325 mg by mouth daily.        . ferrous sulfate (IRON SUPPLEMENT) 325 (65 FE) MG tablet Take 325 mg by mouth daily.        . metoprolol tartrate (LOPRESSOR) 25 MG tablet Take 25 mg by mouth 2 (two) times daily.        . simvastatin (ZOCOR) 20 MG tablet Take 20 mg by mouth at bedtime.        . traMADol (ULTRAM) 50 MG tablet Take 50 mg by mouth every 6 (six) hours as needed.        . famotidine (PEPCID) 20 MG tablet Take 1 tablet (20 mg total) by mouth 2 (two) times daily.  60 tablet  11  . DISCONTD: famotidine (PEPCID) 20 MG tablet Take 1 tablet (20 mg total) by mouth 2 (two) times daily.  60 tablet  11    Allergies: No Known Allergies  Social history:  Nonsmoker  ROS:  Please see the history of present illness.  All other systems reviewed and negative.   Vital Signs: BP 130/88  Pulse 83  Ht 5\' 7"  (1.702 m)  Wt 247 lb (112.038 kg)  BMI 38.69 kg/m2  LMP 10/20/2010  PHYSICAL EXAM: Well nourished, well developed, in no acute distress HEENT: normal Neck: no JVD Cardiac:  normal S1, S2; RRR; no murmur, No rub Chest: Median sternotomy scar well  healed without edema or discharge Lungs:  clear to auscultation bilaterally, no wheezing, rhonchi or rales Abd: soft, nontender, no hepatomegaly Ext: no edema; Right radial site without hematoma or bruit Skin: warm and dry Neuro:  CNs 2-12 intact, no focal abnormalities noted  EKG:  Sinus rhythm, heart rate 78, T wave inversions in 1, aVL, 2, 3, aVF, V2-V6  ASSESSMENT AND PLAN:

## 2010-11-14 NOTE — Assessment & Plan Note (Signed)
Check lipids and LFTs in 6-8 weeks. 

## 2010-11-14 NOTE — Patient Instructions (Addendum)
Your physician has requested that you have an echocardiogram THIS NEEDS TO BE DONE WITHIN THE NEXT 1-2 DAYS PER DR. Riley Kill AND SCOTT WEAVER, PA-C DX TORN LAD POST BYPASS PER DR. Riley Kill. Echocardiography is a painless test that uses sound waves to create images of your heart. It provides your doctor with information about the size and shape of your heart and how well your heart's chambers and valves are working. This procedure takes approximately one hour. There are no restrictions for this procedure.  A chest x-ray DX 786.50 CHEST PAIN takes a picture of the organs and structures inside the chest, including the heart, lungs, and blood vessels. This test can show several things, including, whether the heart is enlarges; whether fluid is building up in the lungs; and whether pacemaker / defibrillator leads are still in place.   Your physician has requested that you have an echocardiogram DX 786.50 Chest pain, 414.01 CAD. Echocardiography is a painless test that uses sound waves to create images of your heart. It provides your doctor with information about the size and shape of your heart and how well your heart's chambers and valves are working. This procedure takes approximately one hour. There are no restrictions for this procedure.  Your physician has recommended you make the following change in your medication: START PEPCID 20 MG 1 TABLET TWICE DAILY  Your physician recommends that you return for lab work in: 01/06/11 FASTING LIVER/LIPID PANEL 414.01 CAD  Your physician recommends that you schedule a follow-up appointment in: 2 WEEKS WITH EITHER DR. Riley Kill OR SCOTT WEAVER, PA-C SAME DAY DR. Riley Kill IN THE OFFICE

## 2010-11-14 NOTE — Assessment & Plan Note (Signed)
Stable.  She will enroll in cardiac rehabilitation.  Proceed with echo as noted above.  She has followup with [facility] next week.

## 2010-11-14 NOTE — Assessment & Plan Note (Signed)
I suspect this is related to her recent gastroenteritis causing some chest wall pain.  The patient will have a chest x-ray performed.  I also will have her get an echocardiogram to look for possibility of pericardial effusion.  I will place her on Pepcid 20 mg twice a day.  She will followup in the next couple of weeks.

## 2010-11-14 NOTE — Assessment & Plan Note (Signed)
Controlled.  Her heart rate could be better.  I will have her increase her metoprolol to 37.5 mg twice a day.

## 2010-11-15 ENCOUNTER — Other Ambulatory Visit: Payer: Self-pay | Admitting: *Deleted

## 2010-11-15 ENCOUNTER — Telehealth: Payer: Self-pay | Admitting: Physician Assistant

## 2010-11-15 MED ORDER — METOPROLOL TARTRATE 25 MG PO TABS: ORAL_TABLET | ORAL | Status: DC

## 2010-11-15 NOTE — Op Note (Signed)
NAME:  Ashlee Stein, Ashlee Stein        ACCOUNT NO.:  [id number]  MEDICAL RECORD NO.:  [id number]  LOCATION:  2305                         FACILITY:  [facility]  PHYSICIAN:  Kerin Perna, M.D.  DATE OF BIRTH:  1965-11-17  DATE OF PROCEDURE:  10/24/2010 DATE OF DISCHARGE:                              OPERATIVE REPORT   OPERATION: 1. Placement of right internal jugular Swan-Ganz catheter for     intraoperative hemodynamic monitoring. 2. Emergency coronary bypass grafting x1 using the endoscopically     harvested saphenous vein to the distal left anterior descending at     the apex. 3. Endoscopic harvest of right leg greater saphenous vein.  SURGEON:  Kerin Perna, MD  ASSISTANT:  Stephanie Acre. Dasovich, PA.  PREOPERATIVE DIAGNOSIS:  Acute spontaneous mid-to-distal left anterior descending dissection with acute anterior myocardial infarction.  POSTOPERATIVE DIAGNOSIS:  Acute spontaneous mid-to-distal left anterior descending dissection with acute anterior myocardial infarction.  INDICATIONS:  The patient is a 45 year old of morbid obese black female, who presented with chest pain, shortness of breath, and positive cardiac enzymes.  A cardiac catheterization demonstrated spontaneous LAD dissection and attempt PCI of the dissection resulted in no improvement in flow, so surgical evaluation was requested for emergency bypass grafting.  I discussed the situation and the benefits and risks of bypass surgery the patient as well as her family and after reviewing the issues, they demonstrated their understanding and agreed to proceed with surgery as emergency.  OPERATIVE FINDINGS: 1. The patient's body habitus made it very difficult to expose the     internal mammary artery and was not felt this would provide     adequate length to reach the LV apex of the distal anastomosis was     required. 2. Intraoperative anemia requiring 2 units packed cells. 3. Intraoperative coagulopathy probably  related to Angiomax used in     the cath lab, requiring FFP and platelets. 4. Well-preserved anterior wall on intraoperative exam.  PROCEDURE:  The patient was brought to operative room and placed on the operating table.  General anesthesia was induced.  I prepped and draped the right neck and under local 1% lidocaine, inserted a sheath into the right IJ over a guidewire.  Through this sheath, a Swan-Ganz catheter was floated.  PA pressures were 35/15 and cardiac output was 4 L per minute.  The patient was then prepped and draped as a sterile field.  A sternal incision was made, the saphenous vein was harvested endoscopically from the right leg.  It was a good vein of good quality.  The pericardium was opened and suspended.  Pursestrings were placed in the ascending aorta and right atrium.  Heparin was administered and ACT was documented as being therapeutic.  Through the pursestrings, the patient was then cannulated and placed on cardiopulmonary bypass.  The LAD was inspected.  There was a long area of apparent dissection from the mid-LAD down to the distal third.  There was no evidence of blood leaking from the site of the dissection and there was no large or significant pericardial effusion upon opening the pericardium. Cardioplegia catheters were placed both antegrade and retrograde cold blood cardioplegia.  The patient was cooled to 32 degrees.  The crossclamp was then applied.  An 800 mL of cold blood cardioplegia was delivered in split doses between the antegrade aortic and retrograde coronary sinus catheters. There was good cardioplegic arrest and septal temperature dropped less than 12 degrees.  Cardioplegia was delivered every 20 minutes unless the crossclamp was in place.  Attention was then directed to LAD.  I decided to open the LAD and the distal aspect of the dissected segment in order to try to repair the layers of the vessel and to reestablish flow through the true  lumen.  An arteriotomy was made.  However, after opening the adventitia, the media and enema were bunched up in a tangle of torn tissue.  The arteriotomy was extended in order to try to reestablish true lumen for the anastomosis.  Appeared that true lumen was identified distally and probably proximally.  The vein was then brought down after it was spatulated to the large size of arteriotomy and end-to-side anastomosis between the vein and dissected LAD was performed.  Care was taken to attempt restoring the normal anatomy of the vessel with the anastomosis running suture line.  After completing the anastomosis, the graft was checked and flow was poor.  There was no leak or extravasation of blood through the epicardium, however.  It was felt that this graft would not suitably restore adequate flow or improve the situation, so the vein was gently ligated just above the anastomosis to allow effective vein patch of this area of the vessel.  After cardioplegia was redelivered, a second arteriotomy was made more distally right at the LV apex.  At this point, the LAD was very thin delicate vessel but was not dissected and measured 1.5 mm in diameter. The freshened end of the vein was sewn end-to-side with running 8-0 Prolene and after this anastomosis, the graft was checked and there was excellent flow of cardioplegia and blood through the anastomosis using a handheld delivery.  After the distal anastomosis was completed, the proximal anastomosis on the ascending aorta was performed using a 4.5-mm punch running 6-0 Prolene.  Prior to tying down the final proximal anastomosis, air was vented from the coronaries with a dose of retrograde warm blood cardioplegia.  The crossclamp was then removed.  The heart was cardioverted back to a regular rhythm.  The vein graft was checked and found to be with good flow and with hemostasis at the proximal distal anastomosis.  The patient was rewarmed to  37 degrees. Temporary pacing wires were applied.  The lungs re-expanded, ventilator was resumed.  The patient was then weaned off bypass without difficulty. The cardiac function appeared to be quite dynamic.  The patient was on just low-dose dopamine.  Protamine was administered without adverse reaction.  The cannulas were removed.  The mediastinum was irrigated with warm saline.  There was significant coagulopathy.  The patient was given platelets and FFP.  The superior pericardial fat was closed over the aorta.  A mediastinal chest tube was placed and brought to separate incision.  The sternum was closed with interrupted steel wire.  The pectoralis fascia was closed in running #1 Vicryl.  Subcutaneous and skin layers were closed in running Vicryl and sterile dressings were applied.  Total bypass time was 82 minutes.     Kerin Perna, M.D.     PV/MEDQ  D:  10/25/2010  T:  10/25/2010  Job:  [id number]  cc:   Arturo Morton. Riley Kill, MD, [facility]  Electronically Signed by Kathlee Nations TRIGT M.D.  on 11/15/2010 01:22:56 PM

## 2010-11-15 NOTE — Consult Note (Signed)
NAME:  Ashlee Stein, Ashlee Stein        ACCOUNT NO.:  [id number]  MEDICAL RECORD NO.:  [id number]  LOCATION:  2305                         FACILITY:  [facility]  PHYSICIAN:  Kerin Perna, M.D.  DATE OF BIRTH:  Jan 27, 1966  DATE OF CONSULTATION:  10/25/2010 DATE OF DISCHARGE:                                CONSULTATION   PHYSICIAN REQUESTING CONSULTATION:  Arturo Morton. Riley Kill, MD, [facility]  PRIMARY CARDIOLOGIST:  Bevelyn Buckles. Bensimhon, MD  REASON FOR CONSULTATION:  Acute MI, unstable angina, LAD spontaneous dissection.  PRESENT ILLNESS:  I was called to the Cath Lab to evaluate this 45 year old obese African American female, nondiabetic, who was admitted to the hospital on October 23, 2010, with substernal chest pain, shortness of breath and diaphoresis and positive cardiac enzymes.  Although the EKG showed nonspecific ST-segment changes, cardiac enzymes were repeated and remained positive.  She was given aspirin, IV heparin and Lopressor and referred to [facility] for urgent cardiac cath.  This was performed by Dr. Shawnie Pons, which demonstrated a spontaneous mid-to-distal third LAD dissection.  The other coronaries are appeared to be normal. A ventriculogram was not performed.  Percutaneous intervention of the LAD dissection demonstrated no significant improvable and there appeared to be less flow down the LAD, so Cardiothoracic Surgical evaluation was requested.  The patient remained hemodynamically stable.  She was fairly, heavily sedated, but was not actively complaining of chest pain. Her ST-segment changes were subtle.  A urgent surgical coronary revascularization was recommended.  PAST MEDICAL HISTORY: 1. Hypertension, on oral medication. 2. Iron deficiency anemia, on chronic iron. 3. Obesity. 4. No known drug allergies.  HOME MEDICATIONS: 1. Lisinopril/hydrochlorothiazide 20/25 mg daily. 2. Iron tablets 325 mg daily. 3. Tylenol p.r.n.  SOCIAL HISTORY:  The patient lives  with her husband and son in [place] and has a daughter in college at [employer].  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  FAMILY HISTORY:  There is no family history of aortic dissection, aortic aneurysm disease, or coronary dissection.  There is a positive family history for MI and hypertension, and possibly cardiac valve disease.  REVIEW OF SYSTEMS:  CONSTITUTIONAL:  Review is negative for fever or weight loss.  ENT:  Review is negative for difficulty swallowing or active dental complaints.  THORACIC:  Review is negative for history of thoracic trauma or recent upper respiratory infection symptoms. CARDIAC:  Review is positive for coronary dissection, negative for prior heart disease.  GI:  Review is positive for her central obesity, negative for hepatitis, jaundice or blood per rectum..  Iron deficiency anemia has been evaluated, but apparently she has never had colonoscopy. Endocrine:  Review is negative diabetes.  Her A1c is 5.8.  VASCULAR: Review is negative DVT, claudication, or TIA.  NEUROLOGIC:  Review is negative for seizure or stroke.  PHYSICAL EXAMINATION:  VITAL SIGNS:  The patient is 5 feet and 3 inches, weighs 260 pounds, blood pressure is 138/70, pulse is 94, oxygen saturation on nasal cannula was 97%. GENERAL:  She is a morbidly obese African American female in the cardiac cath lab, partially sedated, but somewhat anxious. HEENT:  Normocephalic.  Pupils are equal. NECK:  Without JVD or mass.  PULMONARY:  Thorax is without deformity.  Breath sounds are somewhat distant, but without wheeze. CARDIAC:  Regular rhythm without murmur, rub, or gallop. ABDOMEN:  Obese, no palpable mass appreciated. EXTREMITIES:  Reveal positive pulses, somewhat cool, no tenderness. NEUROLOGIC:  Alert and oriented, mildly sedated from medication given in the Cath Lab, but no focal motor deficit.  She is conversant and responsive.  LABORATORY DATA:  Her CBC  reveals hematocrit of 33, platelet count of 300,000, white count 4.5.  Her nasal swab for MRSA is negative and her hemoglobin A1c is 5.8.  CPK-MB was 3.5.  Troponin was 0.2.  I reviewed the coronary arteriograms in detail with Dr. Riley Kill in the Cath Lab and discussed the situation regarding best treatment options. We discussed the advantages of surgical therapy in order to establish better blood flow to the distal LAD territory, but with the understanding that may be difficult to reconstruct the dissected vessels and to create a bypass very distally at the apex that would actually help the patient.  We discussed the possibility of conservative watching and felt that this would be too risky with a possibility of further dissection.  We both felt that proceeding with emergency bypass grafting would be the best option to help preserve the anterior wall left ventricle and to allow healing of the dissection.  The patient was then counseled and the situation discussed with family and decision was made to proceed with emergency bypass surgery.  Thank you very much for the consultation.     Kerin Perna, M.D.     PV/MEDQ  D:  10/25/2010  T:  10/25/2010  Job:  [id number]  Electronically Signed by Kerin Perna M.D. on 11/15/2010 01:23:04 PM

## 2010-11-15 NOTE — Discharge Summary (Signed)
NAME:  Ashlee Stein, Ashlee Stein        ACCOUNT NO.:  [id number]  MEDICAL RECORD NO.:  [id number]  LOCATION:  2015                         FACILITY:  [facility]  PHYSICIAN:  Kerin Perna, M.D.  DATE OF BIRTH:  05-Jun-1965  DATE OF ADMISSION:  10/23/2010 DATE OF DISCHARGE:                              DISCHARGE SUMMARY   HISTORY:  The patient is a 45 year old obese black female, who was admitted on August 29 with substernal chest pain, shortness of breath, and diaphoresis as well as positive cardiac enzymes.  She was treated medically with intravenous heparin as well as aspirin and Lopressor and transferred to [facility] for cardiac catheterization from [facility] where she presented.  PAST MEDICAL HISTORY: 1. Hypertension. 2. Iron deficiency anemia, on chronic iron supplement. 3. Obesity.  ALLERGIES:  No known drug allergies.  MEDICATIONS PRIOR TO ADMISSION: 1. Lisinopril/hydrochlorothiazide 20/25 mg daily. 2. Iron tablet 325 mg daily. 3. Tylenol p.r.n.  SOCIAL HISTORY:  She lives with her husband and son in [place] and has a daughter in a college at [employer].  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  FAMILY HISTORY:  No history of aortic dissection, aortic aneurysm disease, or coronary dissection.  There is a positive family history for myocardial infarction and hypertension and possibly cardiac valve disease.  REVIEW OF SYMPTOMS AND PHYSICAL EXAM:  Please see the history and physical done at the time of admission.  HOSPITAL COURSE:  The patient was transferred to [facility] for cardiac catheterization.  This was performed by Dr. Riley Kill, and demonstrated a spontaneous mid to distal third LAD dissection.  The other coronary arteries appeared to be normal.  A ventriculogram was not performed.  A percutaneous intervention of the LAD was done but did not improve the dissection and subsequent to this, cardiac surgical consultation  was requested and obtained with Kerin Perna, MD, who evaluated the patient and studies and agreed recommendations to proceed with emergent surgical coronary revascularization.  Procedure on October 24, 2010, she underwent an emergency coronary artery bypass grafting x1 using in endoscopically harvested saphenous vein to the distal LAD.  Important operative findings included the fact that the patient's body habitus made it very difficult to expose the left internal mammary artery and it was not felt that this would provide enough length to reach the LV apex for the distal anastomosis.  Additionally, intraoperative anemia required 2 units of packed cells as well as intraoperative coagulopathy felt to be related to the angio max required FFP and platelets.  She appeared to have well preserved anterior wall function on intraoperative exam.  She was taken to the Surgical Intensive Care Unit in stable condition.  POSTOPERATIVE HOSPITAL COURSE:  The patient has progressed quite nicely. She has maintained stable hemodynamics.  She has had no significant cardiac dysrhythmias.  She has had all routine lines, monitors, and drainage devices discontinued in the standard fashion.  Her incisions are healing well without evidence of infection.  She does have an acute blood loss anemia that is stabilized.  Her most recent hemoglobin/hematocrit dated on October 27, 2010 are 8.2 and 23.9 respectively.  Her oxygen has been weaned and she maintained good saturations  on room air.  She does have a postoperative volume overload and will require some further diuresis.  She has had some difficulty with nausea, but this has improved with time.  She is tolerating gradual increase in activity using standard protocols.  Overall, her status is felt to be tentatively stable for discharge in the morning of October 29, 2010 pending morning round reevaluation.  INSTRUCTIONS:  The patient will receive written  instructions regarding medications, activity, diet, wound care, and followup.  Followup include Dr. Donata Clay in 3 weeks.  Dr. Riley Kill in 2 weeks.  MEDICATIONS ON DISCHARGE: 1. Enteric-coated aspirin 325 mg daily. 2. Lasix 40 mg daily for 7 days. 3. Metoprolol 25 mg twice daily. 4. Oxycodone 5 mg IR tablet 1-2 every 4-6 hours p.r.n. for pain. 5. Potassium chloride 20 mEq daily. 6. Iron sulfate 325 mg daily. 7. Visine eyedrops p.r.n.  DIAGNOSIS:  Severe single-vessel coronary artery disease secondary to dissection with acute anterior myocardial infarction.  OTHER DIAGNOSES: 1. History of hypertension. 2. History of iron deficiency anemia. 3. Postoperative volume overload. 4. Postoperative acute blood loss anemia. 5. Postoperative coagulopathy. 6. Obesity.     Rowe Clack, P.A.-C.   ______________________________ Kerin Perna, M.D.    Sherryll Burger  D:  10/28/2010  T:  10/28/2010  Job:  [id number]  cc:   Arturo Morton. Riley Kill, MD, [facility]  Electronically Signed by Gershon Crane P.A.-C. on 10/31/2010 12:39:59 PM Electronically Signed by Kerin Perna M.D. on 11/15/2010 01:22:50 PM

## 2010-11-15 NOTE — Telephone Encounter (Signed)
sent rx for increased dose of lopressor 37.5 twice daily, pt aware. Ashlee Stein

## 2010-11-15 NOTE — Telephone Encounter (Addendum)
Pt Dropped off [employer] Attending Physicians Statement yesterday while being Seen by Jenel Lucks laid on my Desk I will send back to Okey Regal for Lorin Picket to complete, also spoke with pt this am she is asking this be done ASAP, i let her know it takes 7-10 business days to complete, once completed Fax and call pt  11/15/10/km  11/15/10.Lorin Picket completed Attending Physicians Statement faxed to [employer] Per Carol,Mailed Pt Original ( called pt x3 to let know ready, no Answer) kdm

## 2010-11-18 ENCOUNTER — Other Ambulatory Visit (HOSPITAL_COMMUNITY): Payer: BC Managed Care – PPO | Admitting: Radiology

## 2010-11-18 ENCOUNTER — Other Ambulatory Visit: Payer: Self-pay | Admitting: Cardiothoracic Surgery

## 2010-11-18 DIAGNOSIS — I219 Acute myocardial infarction, unspecified: Secondary | ICD-10-CM

## 2010-11-18 DIAGNOSIS — I2510 Atherosclerotic heart disease of native coronary artery without angina pectoris: Secondary | ICD-10-CM

## 2010-11-20 ENCOUNTER — Ambulatory Visit (INDEPENDENT_AMBULATORY_CARE_PROVIDER_SITE_OTHER): Payer: Self-pay | Admitting: Cardiothoracic Surgery

## 2010-11-20 ENCOUNTER — Encounter: Payer: Self-pay | Admitting: Cardiothoracic Surgery

## 2010-11-20 ENCOUNTER — Ambulatory Visit
Admission: RE | Admit: 2010-11-20 | Discharge: 2010-11-20 | Disposition: A | Discharge status: Home / Self Care | Payer: BC Managed Care – PPO | Source: Ambulatory Visit | Attending: Cardiothoracic Surgery | Admitting: Cardiothoracic Surgery

## 2010-11-20 VITALS — BP 130/91 | HR 84 | Resp 18 | Ht 67.0 in | Wt 241.0 lb

## 2010-11-20 DIAGNOSIS — I219 Acute myocardial infarction, unspecified: Secondary | ICD-10-CM

## 2010-11-20 DIAGNOSIS — Z951 Presence of aortocoronary bypass graft: Secondary | ICD-10-CM

## 2010-11-20 DIAGNOSIS — I2510 Atherosclerotic heart disease of native coronary artery without angina pectoris: Secondary | ICD-10-CM

## 2010-11-20 DIAGNOSIS — I2542 Coronary artery dissection: Secondary | ICD-10-CM

## 2010-11-20 NOTE — Patient Instructions (Signed)
OK to drive , do not lift more than 10 lbs, OK to start cardiac rehab

## 2010-11-20 NOTE — Progress Notes (Signed)
HPI The patient returns for a 4 week postop followup after emergency coronary bypass grafting to the distal LAD for spontaneous LAD dissection. She denies recurrent angina. The surgical incisions are healing well. She has been seen by her cardiology office last week.  Current Outpatient Prescriptions  Medication Sig Dispense Refill  . aspirin 325 MG tablet Take 325 mg by mouth daily.        . ferrous sulfate (IRON SUPPLEMENT) 325 (65 FE) MG tablet Take 325 mg by mouth daily.        . metoprolol tartrate (LOPRESSOR) 25 MG tablet 2 (two) times daily.        . simvastatin (ZOCOR) 20 MG tablet Take 20 mg by mouth at bedtime.        . traMADol (ULTRAM) 50 MG tablet Take 50 mg by mouth every 6 (six) hours as needed.        Marland Kitchen DISCONTD: metoprolol tartrate (LOPRESSOR) 25 MG tablet Take 1 and 1/2 (half) tablets twice daily  90 tablet  11     Review of Systems: No fever, she has lost 15 pounds after surgery, she has insomnia.   Physical Exam Blood pressure 130/90, pulse 84 regular, O2 sat 99%. She is alert and pleasant. Breath sounds are clear and equal. The sternum is stable and well-healed. Cardiac rhythm is regular without gallop or murmur. Leg at the vein harvest site is well-healed.   Diagnostic Tests: A chest x-ray performed today shows clear lung fields, no pleural effusion, sternal wires intact.   Impression: Stable course one month following emergency single-vessel bypass grafting to the distal  LAD for coronary dissection using a saphenous vein graft.  Plan: She will resume driving in light activities and start outpatient cardiac rehabilitation. She'll return to see me in 2 months to discuss return to work. We discussed blood pressure control and weight control as important issues to prevent recurrence of coronary dissection.

## 2010-11-21 ENCOUNTER — Ambulatory Visit (HOSPITAL_COMMUNITY): Payer: BC Managed Care – PPO | Attending: Cardiology | Admitting: Radiology

## 2010-11-21 ENCOUNTER — Telehealth: Payer: Self-pay | Admitting: Cardiology

## 2010-11-21 DIAGNOSIS — I2510 Atherosclerotic heart disease of native coronary artery without angina pectoris: Secondary | ICD-10-CM | POA: Insufficient documentation

## 2010-11-21 DIAGNOSIS — I059 Rheumatic mitral valve disease, unspecified: Secondary | ICD-10-CM | POA: Insufficient documentation

## 2010-11-21 DIAGNOSIS — I079 Rheumatic tricuspid valve disease, unspecified: Secondary | ICD-10-CM | POA: Insufficient documentation

## 2010-11-21 DIAGNOSIS — E785 Hyperlipidemia, unspecified: Secondary | ICD-10-CM | POA: Insufficient documentation

## 2010-11-21 DIAGNOSIS — E669 Obesity, unspecified: Secondary | ICD-10-CM | POA: Insufficient documentation

## 2010-11-21 DIAGNOSIS — I10 Essential (primary) hypertension: Secondary | ICD-10-CM | POA: Insufficient documentation

## 2010-11-21 DIAGNOSIS — I379 Nonrheumatic pulmonary valve disorder, unspecified: Secondary | ICD-10-CM | POA: Insufficient documentation

## 2010-11-21 DIAGNOSIS — R079 Chest pain, unspecified: Secondary | ICD-10-CM | POA: Insufficient documentation

## 2010-11-21 NOTE — Telephone Encounter (Signed)
Patient was seen today for echo . H/O open heart surgery on  Aug 30 th. Pt was lying on her side for 40 min. C/O uncomfortable position. patient states went she left the office, still  C/O  chest tightness / pain. Patient explain to tech that she was uncomfortable. Patient / husband ask for a pillow.  Patient stated  The experience was unpleasant. Would like for the nurse to call back.

## 2010-11-21 NOTE — Telephone Encounter (Signed)
Notes Recorded by Roger Shelter. Fiato, CMA on 11/21/2010 at 4:29 PM Pt aware of echo results today. Danielle Rankin  I spoke with Okey Regal and she said that when she called the pt with her results she explained that the test does take about 45 minutes to complete and that the pt has to lay on back and side to obtain the best pictures.

## 2010-11-27 ENCOUNTER — Other Ambulatory Visit (HOSPITAL_COMMUNITY): Payer: BC Managed Care – PPO | Admitting: Radiology

## 2010-12-06 ENCOUNTER — Ambulatory Visit (INDEPENDENT_AMBULATORY_CARE_PROVIDER_SITE_OTHER): Payer: BC Managed Care – PPO | Admitting: Physician Assistant

## 2010-12-06 ENCOUNTER — Encounter: Payer: Self-pay | Admitting: Physician Assistant

## 2010-12-06 VITALS — BP 124/88 | HR 62 | Ht 67.5 in | Wt 245.1 lb

## 2010-12-06 DIAGNOSIS — R05 Cough: Secondary | ICD-10-CM | POA: Insufficient documentation

## 2010-12-06 DIAGNOSIS — R059 Cough, unspecified: Secondary | ICD-10-CM

## 2010-12-06 DIAGNOSIS — R079 Chest pain, unspecified: Secondary | ICD-10-CM

## 2010-12-06 DIAGNOSIS — I2542 Coronary artery dissection: Secondary | ICD-10-CM

## 2010-12-06 LAB — CBC WITH DIFFERENTIAL/PLATELET
Basophils Absolute: 0 10*3/uL (ref 0.0–0.1)
Eosinophils Absolute: 0.3 10*3/uL (ref 0.0–0.7)
HCT: 29.1 % — ABNORMAL LOW (ref 36.0–46.0)
Lymphs Abs: 1.4 10*3/uL (ref 0.7–4.0)
MCHC: 32.2 g/dL (ref 30.0–36.0)
MCV: 81.8 fl (ref 78.0–100.0)
Monocytes Absolute: 0.6 10*3/uL (ref 0.1–1.0)
Platelets: 369 10*3/uL (ref 150.0–400.0)
RDW: 18 % — ABNORMAL HIGH (ref 11.5–14.6)

## 2010-12-06 LAB — LIPID PANEL
Cholesterol: 165 mg/dL (ref 0–200)
LDL Cholesterol: 91 mg/dL (ref 0–99)
Triglycerides: 94 mg/dL (ref 0.0–149.0)
VLDL: 18.8 mg/dL (ref 0.0–40.0)

## 2010-12-06 LAB — HEPATIC FUNCTION PANEL: Total Bilirubin: 0.3 mg/dL (ref 0.3–1.2)

## 2010-12-06 MED ORDER — AZITHROMYCIN 250 MG PO TABS: ORAL_TABLET | ORAL | Status: AC

## 2010-12-06 MED ORDER — FAMOTIDINE 20 MG PO TABS: 20.0000 mg | ORAL_TABLET | Freq: Two times a day (BID) | ORAL | Status: DC

## 2010-12-06 NOTE — Assessment & Plan Note (Signed)
Suspect this is all soreness from median sternotomy.  Reassurance.

## 2010-12-06 NOTE — Patient Instructions (Addendum)
Your physician recommends that you schedule a follow-up appointment in: 01/03/11 @ 10 am to SEE DR. Riley Kill   Your physician has recommended you make the following change in your medication: DECREASE ASPIRIN TO 81 MG DAILY; TAKE PEPCID 1 TABLET TWICE DAILY AND IF AFTER 1 WEEK IF STILL NO BETTER THAN HAVE YOUR Z-PAK FILLED (ANTIBIOTIC)  You have been referred to [facility] 414.12 CORONARY ARTERY DISSECTION  OK TO STAY OFF SIMVASTATIN FOR RIGHT NOW  Your physician recommends that you return for lab work in: TODAY CBC, FLP/LFT 414.12 CORONARY ARTERY DISSCETION

## 2010-12-06 NOTE — Assessment & Plan Note (Signed)
She can start cardiac rehab.  Check CBC and stop Iron if hgb stable.  She is now off simvastatin.  Discussed with Dr. Riley Kill.  Ok to remain off and will check FLP and LFTs.  Follow up with Dr. Riley Kill in 4 weeks.

## 2010-12-06 NOTE — Assessment & Plan Note (Signed)
?   Infectious vs secondary to acid reflux.  Recent CXRs ok.  No fevers and no purulent sputum.  I recommended she try some OTC Claritin to see if this helps.  I also have called in a Rx for a Zpak.  However,  I want her to try an acid reducer first.  I wanted her to use Protonix.  But, she wants to stick with the pepcid I gave her last time.  I asked her to take Pepcid bid every day.  If her cough does not improve with acid reduction, she can get the Zpak filled and complete a course of antibiotics.

## 2010-12-06 NOTE — Progress Notes (Signed)
History of Present Illness: Primary Cardiologist:  Dr.  Revonda Standard Dillard is a 45 y.o. female who presents for follow up.  She was admitted 8/29-9/3.  She presented with chest pain and ruled in for a non-ST elevation myocardial infarction.  Cardiac catheterization 8/30: Mid to distal LAD spontaneous dissection, otherwise no CAD.  Echo 8/31: EF 55-60%, calcified AV annulus, mild to moderate TR, pericardial effusion.  She underwent single-vessel bypass with an SVG to the LAD by Dr. Donata Clay.  I saw her a couple weeks ago with Dr. Riley Kill.  She was having chest pain and had recently had some symptoms of gastroenteritis.  Because of her chest pain, I obtained a CXR and echo.  Both were unremarkable.  Today she continues to note chest soreness.  She also notes a cough.  Feels congested.  Dr. Donata Clay recommended Mucinex but this did not help.  She notes clear to white sputum.  No fevers. No purulent sputum or hemoptysis.  No head congestions, sore throat or otalgia.  She does note some dental pain.  Denies wheezing.  Has some mild dyspnea that is overall improved.  Sleeps on 3 pillows.  No PND or edema.  No fevers.  Denies syncope.  Has occasional skipped heart beats.    Past Medical History  Diagnosis Date  . Hypertension   . Iron deficiency anemia   . Obesity   . Coronary artery dissection     NSTEMI 8/12: cath with mid to dist LAD spont. dissection, unable to do PCI and referred for CABG:  SVG to LAD (Dr. PVT)  . Dyslipidemia     Current Outpatient Prescriptions  Medication Sig Dispense Refill  . ferrous sulfate (IRON SUPPLEMENT) 325 (65 FE) MG tablet Take 325 mg by mouth daily.        . metoprolol tartrate (LOPRESSOR) 25 MG tablet 2 (two) times daily.        . traMADol (ULTRAM) 50 MG tablet Take 50 mg by mouth every 6 (six) hours as needed.        Marland Kitchen aspirin EC 81 MG tablet Take 1 tablet (81 mg total) by mouth daily.      Marland Kitchen azithromycin (ZITHROMAX) 250 MG tablet Take 2 tablets  (500 mg) on  Day 1,  followed by 1 tablet (250 mg) once daily on Days 2 through 5.  6 each  0  . famotidine (PEPCID) 20 MG tablet Take 1 tablet (20 mg total) by mouth 2 (two) times daily.  60 tablet  11  . simvastatin (ZOCOR) 20 MG tablet Take 1 tablet (20 mg total) by mouth at bedtime. ON HOLD      . DISCONTD: famotidine (PEPCID) 20 MG tablet Take 1 tablet (20 mg total) by mouth 2 (two) times daily.  60 tablet  11    Allergies: No Known Allergies  Social history:  Nonsmoker  ROS:  Please see the history of present illness.  She does note some dysphagia and water brash symptoms.  No melena, hematochezia.  All other systems reviewed and negative.   Vital Signs: BP 124/88  Pulse 62  Ht 5' 7.5" (1.715 m)  Wt 245 lb 1.9 oz (111.186 kg)  BMI 37.82 kg/m2  PHYSICAL EXAM: Well nourished, well developed, in no acute distress HEENT: normal Neck: no JVD Cardiac:  normal S1, S2; RRR; no murmur, No rub Chest: Median sternotomy scar well healed without edema or discharge Lungs:  clear to auscultation bilaterally, no wheezing, rhonchi or  rales Abd: soft, nontender, no hepatomegaly Ext: no edema Skin: warm and dry Neuro:  CNs 2-12 intact, no focal abnormalities noted  EKG:  Sinus rhythm, heart rate 62, normal axis, PRWP, NSSTTW changes  ASSESSMENT AND PLAN:

## 2010-12-23 ENCOUNTER — Encounter (HOSPITAL_COMMUNITY)
Admission: RE | Admit: 2010-12-23 | Discharge: 2010-12-23 | Disposition: A | Discharge status: Home / Self Care | Payer: BC Managed Care – PPO | Source: Ambulatory Visit | Attending: Cardiology | Admitting: Cardiology

## 2010-12-23 DIAGNOSIS — Z5189 Encounter for other specified aftercare: Secondary | ICD-10-CM | POA: Insufficient documentation

## 2010-12-23 DIAGNOSIS — J45909 Unspecified asthma, uncomplicated: Secondary | ICD-10-CM | POA: Insufficient documentation

## 2010-12-23 DIAGNOSIS — I10 Essential (primary) hypertension: Secondary | ICD-10-CM | POA: Insufficient documentation

## 2010-12-23 DIAGNOSIS — E669 Obesity, unspecified: Secondary | ICD-10-CM | POA: Insufficient documentation

## 2010-12-23 DIAGNOSIS — I2109 ST elevation (STEMI) myocardial infarction involving other coronary artery of anterior wall: Secondary | ICD-10-CM | POA: Insufficient documentation

## 2010-12-23 DIAGNOSIS — Z8249 Family history of ischemic heart disease and other diseases of the circulatory system: Secondary | ICD-10-CM | POA: Insufficient documentation

## 2010-12-23 DIAGNOSIS — D509 Iron deficiency anemia, unspecified: Secondary | ICD-10-CM | POA: Insufficient documentation

## 2010-12-23 DIAGNOSIS — D689 Coagulation defect, unspecified: Secondary | ICD-10-CM | POA: Insufficient documentation

## 2010-12-23 DIAGNOSIS — Z951 Presence of aortocoronary bypass graft: Secondary | ICD-10-CM | POA: Insufficient documentation

## 2010-12-25 ENCOUNTER — Encounter (HOSPITAL_COMMUNITY): Payer: BC Managed Care – PPO

## 2010-12-27 ENCOUNTER — Encounter (HOSPITAL_COMMUNITY): Payer: BC Managed Care – PPO

## 2010-12-27 DIAGNOSIS — E669 Obesity, unspecified: Secondary | ICD-10-CM | POA: Insufficient documentation

## 2010-12-27 DIAGNOSIS — Z951 Presence of aortocoronary bypass graft: Secondary | ICD-10-CM | POA: Insufficient documentation

## 2010-12-27 DIAGNOSIS — J45909 Unspecified asthma, uncomplicated: Secondary | ICD-10-CM | POA: Insufficient documentation

## 2010-12-27 DIAGNOSIS — Z5189 Encounter for other specified aftercare: Secondary | ICD-10-CM | POA: Insufficient documentation

## 2010-12-27 DIAGNOSIS — I10 Essential (primary) hypertension: Secondary | ICD-10-CM | POA: Insufficient documentation

## 2010-12-27 DIAGNOSIS — I2109 ST elevation (STEMI) myocardial infarction involving other coronary artery of anterior wall: Secondary | ICD-10-CM | POA: Insufficient documentation

## 2010-12-27 DIAGNOSIS — D509 Iron deficiency anemia, unspecified: Secondary | ICD-10-CM | POA: Insufficient documentation

## 2010-12-27 DIAGNOSIS — Z8249 Family history of ischemic heart disease and other diseases of the circulatory system: Secondary | ICD-10-CM | POA: Insufficient documentation

## 2010-12-27 DIAGNOSIS — D689 Coagulation defect, unspecified: Secondary | ICD-10-CM | POA: Insufficient documentation

## 2010-12-30 ENCOUNTER — Encounter (HOSPITAL_COMMUNITY): Payer: BC Managed Care – PPO

## 2011-01-01 ENCOUNTER — Encounter (HOSPITAL_COMMUNITY): Payer: BC Managed Care – PPO

## 2011-01-03 ENCOUNTER — Encounter (HOSPITAL_COMMUNITY): Payer: BC Managed Care – PPO

## 2011-01-03 ENCOUNTER — Encounter: Payer: Self-pay | Admitting: Cardiology

## 2011-01-03 ENCOUNTER — Ambulatory Visit (INDEPENDENT_AMBULATORY_CARE_PROVIDER_SITE_OTHER): Payer: BC Managed Care – PPO | Admitting: Cardiology

## 2011-01-03 VITALS — BP 138/89 | HR 62 | Ht 67.0 in | Wt 248.0 lb

## 2011-01-03 DIAGNOSIS — I10 Essential (primary) hypertension: Secondary | ICD-10-CM

## 2011-01-03 DIAGNOSIS — E785 Hyperlipidemia, unspecified: Secondary | ICD-10-CM

## 2011-01-03 DIAGNOSIS — I2542 Coronary artery dissection: Secondary | ICD-10-CM

## 2011-01-03 DIAGNOSIS — D649 Anemia, unspecified: Secondary | ICD-10-CM

## 2011-01-03 LAB — CBC WITH DIFFERENTIAL/PLATELET
Basophils Absolute: 0 K/uL (ref 0.0–0.1)
Basophils Relative: 0.6 % (ref 0.0–3.0)
Eosinophils Absolute: 0.3 K/uL (ref 0.0–0.7)
Eosinophils Relative: 5.2 % — ABNORMAL HIGH (ref 0.0–5.0)
HCT: 28.9 % — ABNORMAL LOW (ref 36.0–46.0)
Hemoglobin: 9.4 g/dL — ABNORMAL LOW (ref 12.0–15.0)
Lymphocytes Relative: 29.9 % (ref 12.0–46.0)
Lymphs Abs: 1.5 K/uL (ref 0.7–4.0)
MCHC: 32.6 g/dL (ref 30.0–36.0)
MCV: 76.7 fl — ABNORMAL LOW (ref 78.0–100.0)
Monocytes Absolute: 0.6 K/uL (ref 0.1–1.0)
Monocytes Relative: 11.3 % (ref 3.0–12.0)
Neutro Abs: 2.7 K/uL (ref 1.4–7.7)
Neutrophils Relative %: 53 % (ref 43.0–77.0)
Platelets: 380 K/uL (ref 150.0–400.0)
RBC: 3.77 Mil/uL — ABNORMAL LOW (ref 3.87–5.11)
RDW: 18.2 % — ABNORMAL HIGH (ref 11.5–14.6)
WBC: 5.1 K/uL (ref 4.5–10.5)

## 2011-01-03 NOTE — Progress Notes (Deleted)
  Subjective:    Patient ID: Ashlee Stein, female    DOB: Aug 30, 1965, 45 y.o.   MRN: [id number]  HPI    Review of Systems     Objective:   Physical Exam        Assessment & Plan:

## 2011-01-03 NOTE — Patient Instructions (Signed)
Your physician recommends that you schedule a follow-up appointment in: 2 MONTHS  Your physician recommends that you have lab work today: CBC  Your physician recommends that you return for a FASTING lipid and liver profile in 6 WEEKS (414.01, 272.0)--nothing to eat or drink after midnight, lab opens at 8:30  Your physician has recommended you make the following change in your medication: Resume Iron, Take Simvastatin 20mg  one by mouth daily

## 2011-01-03 NOTE — Progress Notes (Signed)
Pt c/o dizziness during cool down with light weights.  Pt sx relieved immediately upon sitting and drinking water.  BP-124/88.  Pt states she has not had her usual po intake today.  Pt given more water to drink.  Pt states total relief of sx and was able to continue remainder of cool down and stretching exercises without sx.  Will continue to monitor-jrion,rn

## 2011-01-06 ENCOUNTER — Encounter (HOSPITAL_COMMUNITY): Payer: BC Managed Care – PPO

## 2011-01-06 ENCOUNTER — Other Ambulatory Visit: Payer: BC Managed Care – PPO | Admitting: *Deleted

## 2011-01-07 DIAGNOSIS — D649 Anemia, unspecified: Secondary | ICD-10-CM | POA: Insufficient documentation

## 2011-01-07 NOTE — Progress Notes (Signed)
HPI:  Ashlee Stein is seen today in follow up.  She is stable, and slowly improving overall after having undergone CABG for SCAD.  She had a spontaneous dissection of the mid LAD. We were unable to cross with a wire, which we attempted after reviewing with Dr. Excell Seltzer and Clifton James.  We were unable to navigate the LAD, and the territory was felt to be large enough distally to warrant CABG.  She has made slow but steady progress since surgery.  We have detailed to her in depth the approach to her care.  She has been in rehab. She has stopped her iron at this point in time.   Current Outpatient Prescriptions  Medication Sig Dispense Refill  . aspirin EC 81 MG tablet Take 1 tablet (81 mg total) by mouth daily.      . famotidine (PEPCID) 20 MG tablet Take 1 tablet (20 mg total) by mouth 2 (two) times daily.  60 tablet  11  . ferrous sulfate (IRON SUPPLEMENT) 325 (65 FE) MG tablet Take 1 tablet (325 mg total) by mouth daily.  30 tablet  6  . metoprolol tartrate (LOPRESSOR) 25 MG tablet Take 75 mg by mouth 2 (two) times daily.       . simvastatin (ZOCOR) 20 MG tablet Take 1 tablet (20 mg total) by mouth at bedtime.  30 tablet  6  . traMADol (ULTRAM) 50 MG tablet Take 50 mg by mouth every 6 (six) hours as needed.        Marland Kitchen DISCONTD: famotidine (PEPCID) 20 MG tablet Take 1 tablet (20 mg total) by mouth 2 (two) times daily.  60 tablet  11    No Known Allergies  Past Medical History  Diagnosis Date  . Hypertension   . Iron deficiency anemia   . Obesity   . Coronary artery dissection     NSTEMI 8/12: cath with mid to dist LAD spont. dissection, unable to do PCI and referred for CABG:  SVG to LAD (Dr. PVT)  . Dyslipidemia     No past surgical history on file.  Family History  Problem Relation Age of Onset  . Heart attack Sister     History   Social History  . Marital Status: Married    Spouse Name: N/A    Number of Children: N/A  . Years of Education: N/A   Occupational History  . Not on file.    Social History Main Topics  . Smoking status: Never Smoker   . Smokeless tobacco: Not on file  . Alcohol Use: No  . Drug Use: No  . Sexually Active:    Other Topics Concern  . Not on file   Social History Narrative  . No narrative on file    ROS: Please see the HPI.  All other systems reviewed and negative.  PHYSICAL EXAM:  BP 138/89  Pulse 62  Ht 5\' 7"  (1.702 m)  Wt 112.492 kg (248 lb)  BMI 38.84 kg/m2  General: Well developed, well nourished, in no acute distress. Head:  Normocephalic and atraumatic. Neck: no JVD Chest:  Median sternotomy is well healed.   Lungs: Clear to auscultation and percussion. Heart: Normal S1 and S2.  No murmur, rubs or gallops.  Abdomen:  Normal bowel sounds; soft; non tender; no organomegaly Pulses: Pulses normal in all 4 extremities. Extremities: No clubbing or cyanosis. No edema. Neurologic: Alert and oriented x 3.  EKG:  ASSESSMENT AND PLAN:

## 2011-01-07 NOTE — Assessment & Plan Note (Signed)
We discussed the need to lose weight, and improve on these accounts.  She understands.

## 2011-01-07 NOTE — Assessment & Plan Note (Signed)
Last LFTs were normal.  Will retry and see what response we get to a small challenge of simvastatin.

## 2011-01-07 NOTE — Assessment & Plan Note (Signed)
Reviewed again in detail.  Doing well after rehab.  She is near getting close to return to work.

## 2011-01-07 NOTE — Assessment & Plan Note (Signed)
Still has periods, so will lean toward resuming iron, and recheck CBC at this point in time.

## 2011-01-08 ENCOUNTER — Encounter (HOSPITAL_COMMUNITY): Payer: BC Managed Care – PPO

## 2011-01-08 ENCOUNTER — Ambulatory Visit: Payer: Self-pay | Admitting: Cardiothoracic Surgery

## 2011-01-10 ENCOUNTER — Telehealth: Payer: Self-pay | Admitting: Cardiology

## 2011-01-10 ENCOUNTER — Encounter (HOSPITAL_COMMUNITY)
Admission: RE | Admit: 2011-01-10 | Discharge: 2011-01-10 | Disposition: A | Discharge status: Home / Self Care | Payer: BC Managed Care – PPO | Source: Ambulatory Visit | Attending: Cardiology | Admitting: Cardiology

## 2011-01-10 ENCOUNTER — Encounter (HOSPITAL_COMMUNITY): Payer: BC Managed Care – PPO

## 2011-01-10 DIAGNOSIS — I2542 Coronary artery dissection: Secondary | ICD-10-CM

## 2011-01-10 DIAGNOSIS — D649 Anemia, unspecified: Secondary | ICD-10-CM

## 2011-01-10 MED ORDER — FERROUS SULFATE 325 (65 FE) MG PO TABS: 325.0000 mg | ORAL_TABLET | Freq: Every day | ORAL | Status: DC

## 2011-01-10 MED ORDER — SIMVASTATIN 20 MG PO TABS: 20.0000 mg | ORAL_TABLET | Freq: Every day | ORAL | Status: DC

## 2011-01-10 NOTE — Telephone Encounter (Signed)
Pt returning your call about test results. °

## 2011-01-10 NOTE — Telephone Encounter (Signed)
I spoke with the pt and made her aware of CBC results. The pt will have this rechecked in December.  The pt is also scheduled to see OB GYN (Dr Christin Bach) in January. The pt requested that a Rx be sent into the pharmacy for Ferrous Sulfate and Simvastatin.

## 2011-01-13 ENCOUNTER — Encounter (HOSPITAL_COMMUNITY): Payer: BC Managed Care – PPO

## 2011-01-13 ENCOUNTER — Telehealth: Payer: Self-pay | Admitting: *Deleted

## 2011-01-13 ENCOUNTER — Encounter (HOSPITAL_COMMUNITY)
Admission: RE | Admit: 2011-01-13 | Discharge: 2011-01-13 | Disposition: A | Discharge status: Home / Self Care | Payer: BC Managed Care – PPO | Source: Ambulatory Visit | Attending: Cardiology | Admitting: Cardiology

## 2011-01-13 NOTE — Progress Notes (Signed)
Pt arrived at cardiac rehab today c/o chest discomfort, sharp fleeting, one episode today and one episode yesterday.  Pt states this is same type discomfort she had prior to CABG. Spoke to triage nurse in Dr. Rosalyn Charters office.  The nurse will call the pt back after reviewing with Dr. Riley Kill.  Pt advised, understanding verbalized-jrion,rn

## 2011-01-13 NOTE — Telephone Encounter (Signed)
I spoke with the pt and she needs her last office note and work letter faxed to [employer] (claim #[id number]) at [phone]. I also spoke with the pt about the symptoms cardiac rehab notified our office about this afternoon.  The pt said she had an episode of right sided chest pain over the weekend and this morning.  The pain was very sharp and lasted only a few seconds, like a flash.  I made the pt aware that this was most likely nerve related from her cardiac surgery.  I will make Dr Riley Kill aware of these episodes.

## 2011-01-13 NOTE — Telephone Encounter (Signed)
Per telephone call from Cardiac Rehab at [facility].  Pt is complaining of fleeting chest pain like what she had prior to her CABG.  She is pain free today.  She has no nitroglycerin.  Recently saw Dr Riley Kill.  Pt would like to know Dr Rosalyn Charters thoughts about what she should do.  She is not exercising at Cardiac Rehab today.

## 2011-01-15 ENCOUNTER — Encounter (HOSPITAL_COMMUNITY): Payer: BC Managed Care – PPO

## 2011-01-15 ENCOUNTER — Encounter (HOSPITAL_COMMUNITY)
Admission: RE | Admit: 2011-01-15 | Discharge: 2011-01-15 | Disposition: A | Discharge status: Home / Self Care | Payer: BC Managed Care – PPO | Source: Ambulatory Visit | Attending: Cardiology | Admitting: Cardiology

## 2011-01-17 ENCOUNTER — Encounter (HOSPITAL_COMMUNITY): Payer: BC Managed Care – PPO

## 2011-01-20 ENCOUNTER — Encounter (HOSPITAL_COMMUNITY)
Admission: RE | Admit: 2011-01-20 | Discharge: 2011-01-20 | Disposition: A | Discharge status: Home / Self Care | Payer: BC Managed Care – PPO | Source: Ambulatory Visit | Attending: Cardiology | Admitting: Cardiology

## 2011-01-20 ENCOUNTER — Encounter (HOSPITAL_COMMUNITY): Payer: BC Managed Care – PPO

## 2011-01-20 NOTE — Telephone Encounter (Signed)
I tried again today to make contact with her.  I left a cell phone message.  Will try again tomorrow.  TS Shawnie Pons. 01/20/2011 4:51 PM

## 2011-01-20 NOTE — Telephone Encounter (Signed)
Dr Riley Kill left a message on this pt's voicemail on 01/15/11.

## 2011-01-22 ENCOUNTER — Encounter (HOSPITAL_COMMUNITY)
Admission: RE | Admit: 2011-01-22 | Discharge: 2011-01-22 | Disposition: A | Discharge status: Home / Self Care | Payer: BC Managed Care – PPO | Source: Ambulatory Visit | Attending: Cardiology | Admitting: Cardiology

## 2011-01-22 ENCOUNTER — Encounter (HOSPITAL_COMMUNITY): Payer: BC Managed Care – PPO

## 2011-01-24 ENCOUNTER — Encounter (HOSPITAL_COMMUNITY): Payer: BC Managed Care – PPO

## 2011-01-24 ENCOUNTER — Encounter (HOSPITAL_COMMUNITY)
Admission: RE | Admit: 2011-01-24 | Discharge: 2011-01-24 | Disposition: A | Discharge status: Home / Self Care | Payer: BC Managed Care – PPO | Source: Ambulatory Visit | Attending: Cardiology | Admitting: Cardiology

## 2011-01-24 NOTE — Progress Notes (Signed)
[name] 45 y.o. female Nutrition Note  Spoke with pt.  Nutrition Plan reviewed with pt. Pt wants to lose wt.  Per discussion with pt, pt states she has lost wt in the past by cutting back on portion sizes and making healthier food choices.  Pt eats 3 meals daily.  Weight loss tips discussed.    Nutrition Diagnosis   Food-and nutrition-related knowledge deficit related to lack of exposure to information as related to diagnosis of: ? CVD   Obesity related to excessive energy intake as evidenced by a BMI of 37.6  Nutrition RX/ Estimated Daily Nutrition Needs for: wt loss 1550-2050 Kcal, 40-55 gm fat, 11-16 gm sat fat, 1.5-2.0 gm trans-fat, <1500 mg sodium Nutrition Intervention   Pt's individual nutrition plan including cholesterol goals reviewed with pt.   Pt to attend the Portion Distortion class   Pt to attend the  ? Nutrition I class                     ? Nutrition II class    Pt given handouts for: ? wt loss   Continue client-centered nutrition education by RD, as part of interdisciplinary care. Goal(s)   Pt to identify food quantities necessary to achieve: ? wt loss to a goal wt of 220-232# (100-105.5 kg) at graduation from cardiac rehab.    Pt to describe the benefit of including fruits, vegetables, whole grains, and low-fat dairy products in a heart healthy meal plan. Monitor and Evaluate progress toward nutrition goal with team.

## 2011-01-27 ENCOUNTER — Encounter (HOSPITAL_COMMUNITY)
Admission: RE | Admit: 2011-01-27 | Discharge: 2011-01-27 | Disposition: A | Discharge status: Home / Self Care | Payer: BC Managed Care – PPO | Source: Ambulatory Visit | Attending: Cardiology | Admitting: Cardiology

## 2011-01-27 ENCOUNTER — Encounter (HOSPITAL_COMMUNITY): Payer: BC Managed Care – PPO

## 2011-01-27 DIAGNOSIS — I2109 ST elevation (STEMI) myocardial infarction involving other coronary artery of anterior wall: Secondary | ICD-10-CM | POA: Insufficient documentation

## 2011-01-27 DIAGNOSIS — D689 Coagulation defect, unspecified: Secondary | ICD-10-CM | POA: Insufficient documentation

## 2011-01-27 DIAGNOSIS — I10 Essential (primary) hypertension: Secondary | ICD-10-CM | POA: Insufficient documentation

## 2011-01-27 DIAGNOSIS — J45909 Unspecified asthma, uncomplicated: Secondary | ICD-10-CM | POA: Insufficient documentation

## 2011-01-27 DIAGNOSIS — Z951 Presence of aortocoronary bypass graft: Secondary | ICD-10-CM | POA: Insufficient documentation

## 2011-01-27 DIAGNOSIS — Z8249 Family history of ischemic heart disease and other diseases of the circulatory system: Secondary | ICD-10-CM | POA: Insufficient documentation

## 2011-01-27 DIAGNOSIS — E669 Obesity, unspecified: Secondary | ICD-10-CM | POA: Insufficient documentation

## 2011-01-27 DIAGNOSIS — Z5189 Encounter for other specified aftercare: Secondary | ICD-10-CM | POA: Insufficient documentation

## 2011-01-27 DIAGNOSIS — D509 Iron deficiency anemia, unspecified: Secondary | ICD-10-CM | POA: Insufficient documentation

## 2011-01-29 ENCOUNTER — Encounter (HOSPITAL_COMMUNITY): Payer: BC Managed Care – PPO

## 2011-01-29 ENCOUNTER — Encounter (HOSPITAL_COMMUNITY)
Admission: RE | Admit: 2011-01-29 | Discharge: 2011-01-29 | Disposition: A | Discharge status: Home / Self Care | Payer: BC Managed Care – PPO | Source: Ambulatory Visit | Attending: Cardiology | Admitting: Cardiology

## 2011-01-29 ENCOUNTER — Encounter: Payer: Self-pay | Admitting: Physician Assistant

## 2011-01-29 ENCOUNTER — Ambulatory Visit (INDEPENDENT_AMBULATORY_CARE_PROVIDER_SITE_OTHER): Payer: BC Managed Care – PPO | Admitting: Physician Assistant

## 2011-01-29 DIAGNOSIS — I2542 Coronary artery dissection: Secondary | ICD-10-CM

## 2011-01-29 DIAGNOSIS — I10 Essential (primary) hypertension: Secondary | ICD-10-CM

## 2011-01-29 DIAGNOSIS — R079 Chest pain, unspecified: Secondary | ICD-10-CM

## 2011-01-29 MED ORDER — AMLODIPINE BESYLATE 5 MG PO TABS: 5.0000 mg | ORAL_TABLET | Freq: Every day | ORAL | Status: DC

## 2011-01-29 NOTE — Assessment & Plan Note (Signed)
Stable.  Continue aspirin.  Followup with Dr.  Shawnie Pons in one month.

## 2011-01-29 NOTE — Assessment & Plan Note (Addendum)
Atypical.  Suspect musculoskeletal chest pain.  I discussed with Dr.  Shawnie Pons.  Arrange plain exercise treadmill test.  I advised her to use heat 2-3 times a day to her chest to see if this helps.  If her symptoms worsen or persist, she will contact us for further followup.  Otherwise she can see Dr.  Shawnie Pons in January as scheduled.

## 2011-01-29 NOTE — Assessment & Plan Note (Signed)
Add amlodipine 5 mg daily.  Followup as noted above.

## 2011-01-29 NOTE — Progress Notes (Signed)
  [address] [place], [place]  [address] Phone: [phone] Fax:  [phone]  Date:  01/29/2011   Name:  Ashlee Stein       DOB:  Jun 24, 1965 MRN:  [id number]  PCP:  Dr. Felecia Shelling Primary Cardiologist:  Dr.  Shawnie Pons    History of Present Illness: Ashlee Stein is a 45 y.o. female presents for follow up.  She was admitted 09/2910 with chest pain and ruled in for a non-ST elevation myocardial infarction. Cardiac catheterization 8/30: Mid to distal LAD spontaneous dissection, otherwise no CAD. Echo 8/31: EF 55-60%, calcified AV annulus, mild to moderate TR, pericardial effusion. She underwent single-vessel bypass with an SVG to the LAD by Dr. Donata Clay.  She notes brief episodes of chest pain since Thanksgiving.  They are right sided.  No radiation.  No associated symptoms.  No aggravating or alleviating factors.  No syncope, orthopnea, PND or edema.  Chest is still sore from CABG.  Her symptoms reminded her a little of her presenting symptoms and she was worried.  She also notes her BP has been increasing at cardiac rehab.      Past Medical History  Diagnosis Date  . Hypertension   . Iron deficiency anemia   . Obesity   . Coronary artery dissection     NSTEMI 8/12: cath with mid to dist LAD spont. dissection, unable to do PCI and referred for CABG:  SVG to LAD (Dr. PVT)  . Dyslipidemia     Current Outpatient Prescriptions  Medication Sig Dispense Refill  . aspirin EC 81 MG tablet Take 1 tablet (81 mg total) by mouth daily.      . ferrous sulfate (IRON SUPPLEMENT) 325 (65 FE) MG tablet Take 1 tablet (325 mg total) by mouth daily.  90 tablet  3  . metoprolol tartrate (LOPRESSOR) 25 MG tablet Take 75 mg by mouth 2 (two) times daily.       . simvastatin (ZOCOR) 20 MG tablet Take 1 tablet (20 mg total) by mouth at bedtime.  90 tablet  3  . famotidine (PEPCID) 20 MG tablet Take 1 tablet (20 mg total) by mouth 2 (two) times daily.  60 tablet  11    . traMADol (ULTRAM) 50 MG tablet Take 50 mg by mouth every 6 (six) hours as needed.        Marland Kitchen DISCONTD: famotidine (PEPCID) 20 MG tablet Take 1 tablet (20 mg total) by mouth 2 (two) times daily.  60 tablet  11    Allergies: No Known Allergies  History  Substance Use Topics  . Smoking status: Never Smoker   . Smokeless tobacco: Not on file  . Alcohol Use: No     ROS:  Please see the history of present illness.   All other systems reviewed and negative.   PHYSICAL EXAM: VS:  BP 138/98  Pulse 86  Resp 18  Ht 5\' 7"  (1.702 m)  Wt 250 lb 12.8 oz (113.762 kg)  BMI 39.28 kg/m2 Well nourished, well developed, in no acute distress HEENT: normal Neck: no JVD Cardiac:  normal S1, S2; RRR; no murmur Chest: + tenderness to palpation Lungs:  clear to auscultation bilaterally, no wheezing, rhonchi or rales Abd: soft, nontender, no hepatomegaly Ext: no edema Skin: warm and dry Neuro:  CNs 2-12 intact, no focal abnormalities noted  EKG:   NSR, HR 86, normal axis, NSSTTW changes   ASSESSMENT AND PLAN:

## 2011-01-29 NOTE — Patient Instructions (Addendum)
Your physician recommends that you schedule a follow-up appointment in: 02/2011 WITH DR. Riley Kill PER SCOTT WEAVER, PA-C  Your physician has recommended you make the following change in your medication: START NORVASC 5 MG 1 TABLET DAILY  Your physician has requested that you have an exercise tolerance test DX 786.50 CHEST PAIN, PLEASE SCHEDULE WITH DR. Riley Kill, IF NOT AVAILABLE THEN WITH SCOTT WEAVER, PA-C SAME DAY DR. Riley Kill IS IN THE OFFICE. For further information please visit [email or web address] Please also follow instruction sheet, as given.   NO OTHER CHANGES @ THIS TIME

## 2011-01-31 ENCOUNTER — Encounter (HOSPITAL_COMMUNITY): Payer: BC Managed Care – PPO

## 2011-01-31 ENCOUNTER — Encounter (HOSPITAL_COMMUNITY)
Admission: RE | Admit: 2011-01-31 | Discharge: 2011-01-31 | Disposition: A | Discharge status: Home / Self Care | Payer: BC Managed Care – PPO | Source: Ambulatory Visit | Attending: Cardiology | Admitting: Cardiology

## 2011-02-03 ENCOUNTER — Encounter (HOSPITAL_COMMUNITY): Payer: BC Managed Care – PPO

## 2011-02-03 ENCOUNTER — Encounter (HOSPITAL_COMMUNITY)
Admission: RE | Admit: 2011-02-03 | Discharge: 2011-02-03 | Disposition: A | Discharge status: Home / Self Care | Payer: BC Managed Care – PPO | Source: Ambulatory Visit | Attending: Cardiology | Admitting: Cardiology

## 2011-02-05 ENCOUNTER — Encounter (HOSPITAL_COMMUNITY): Payer: BC Managed Care – PPO

## 2011-02-05 ENCOUNTER — Encounter (HOSPITAL_COMMUNITY)
Admission: RE | Admit: 2011-02-05 | Discharge: 2011-02-05 | Disposition: A | Discharge status: Home / Self Care | Payer: BC Managed Care – PPO | Source: Ambulatory Visit | Attending: Cardiology | Admitting: Cardiology

## 2011-02-07 ENCOUNTER — Encounter (HOSPITAL_COMMUNITY): Payer: BC Managed Care – PPO

## 2011-02-07 ENCOUNTER — Encounter (HOSPITAL_COMMUNITY)
Admission: RE | Admit: 2011-02-07 | Discharge: 2011-02-07 | Disposition: A | Discharge status: Home / Self Care | Payer: BC Managed Care – PPO | Source: Ambulatory Visit | Attending: Cardiology | Admitting: Cardiology

## 2011-02-10 ENCOUNTER — Encounter (HOSPITAL_COMMUNITY)
Admission: RE | Admit: 2011-02-10 | Discharge: 2011-02-10 | Disposition: A | Discharge status: Home / Self Care | Payer: BC Managed Care – PPO | Source: Ambulatory Visit | Attending: Cardiology | Admitting: Cardiology

## 2011-02-10 ENCOUNTER — Encounter (HOSPITAL_COMMUNITY): Payer: BC Managed Care – PPO

## 2011-02-10 ENCOUNTER — Encounter (HOSPITAL_COMMUNITY): Payer: Self-pay | Admitting: Cardiac Rehabilitation

## 2011-02-10 NOTE — Progress Notes (Signed)
Pt c/o fleeting right sided chest discomfort while exercising (bike).  Relieved when activity stopped.  Pt reports this is same quality discomfort evaluated recently by cardiology.  Pt has appt scheduled Wed. 02/12/2011 and will report this discomfort at that time.  Pt has no other complaints and otherwise feels well.

## 2011-02-12 ENCOUNTER — Encounter (HOSPITAL_COMMUNITY): Payer: BC Managed Care – PPO

## 2011-02-12 ENCOUNTER — Ambulatory Visit (INDEPENDENT_AMBULATORY_CARE_PROVIDER_SITE_OTHER): Payer: BC Managed Care – PPO | Admitting: Physician Assistant

## 2011-02-12 DIAGNOSIS — I2542 Coronary artery dissection: Secondary | ICD-10-CM

## 2011-02-12 DIAGNOSIS — I10 Essential (primary) hypertension: Secondary | ICD-10-CM

## 2011-02-12 DIAGNOSIS — R079 Chest pain, unspecified: Secondary | ICD-10-CM

## 2011-02-12 NOTE — Progress Notes (Signed)
Exercise Treadmill Test  Pre-Exercise Testing Evaluation Rhythm: normal sinus  Rate: 74   PR:  .17 QRS:  .07  QT:  .39 QTc: .44     Test  Exercise Tolerance Test Ordering MD: Shawnie Pons, MD  Interpreting MD:  Tereso Newcomer PA-C  Unique Test No: 1  Treadmill:  1  Indication for ETT: known ASHD  Contraindication to ETT: No   Stress Modality: exercise - treadmill  Cardiac Imaging Performed: non   Protocol: standard Bruce - maximal  Max BP:155/73  Max MPHR (bpm):  175 85% MPR (bpm):  146  MPHR obtained (bpm):  151 % MPHR obtained:  87%  Reached 85% MPHR (min:sec):  3:20 Total Exercise Time (min-sec):  3:55  Workload in METS:  5.7 Borg Scale: 15  Reason ETT Terminated:  patient's desire to stop    ST Segment Analysis At Rest: normal ST segments - no evidence of significant ST depression With Exercise: non-specific ST changes  Other Information Arrhythmia:  No Angina during ETT:  absent (0) Quality of ETT:  diagnostic  ETT Interpretation:  normal - no evidence of ischemia by ST analysis  Comments: Poor exercise tolerance. No chest pain. Normal BP response to exercise. No ST-T changes to suggest ischemia.   Recommendations: Follow up as directed. BP up prior to test.  Patient notes BPs at Cardiac Rehab much better. Continue current therapy for now.  Tereso Newcomer, PA-C  3:37 PM 02/12/2011

## 2011-02-14 ENCOUNTER — Encounter (HOSPITAL_COMMUNITY)
Admission: RE | Admit: 2011-02-14 | Discharge: 2011-02-14 | Disposition: A | Discharge status: Home / Self Care | Payer: BC Managed Care – PPO | Source: Ambulatory Visit | Attending: Cardiology | Admitting: Cardiology

## 2011-02-14 ENCOUNTER — Other Ambulatory Visit (INDEPENDENT_AMBULATORY_CARE_PROVIDER_SITE_OTHER): Payer: BC Managed Care – PPO | Admitting: *Deleted

## 2011-02-14 ENCOUNTER — Encounter (HOSPITAL_COMMUNITY): Payer: BC Managed Care – PPO

## 2011-02-14 DIAGNOSIS — I2542 Coronary artery dissection: Secondary | ICD-10-CM

## 2011-02-14 DIAGNOSIS — D649 Anemia, unspecified: Secondary | ICD-10-CM

## 2011-02-14 LAB — CBC WITH DIFFERENTIAL/PLATELET
Basophils Absolute: 0 10*3/uL (ref 0.0–0.1)
Eosinophils Absolute: 0.2 10*3/uL (ref 0.0–0.7)
Lymphocytes Relative: 28.6 % (ref 12.0–46.0)
MCHC: 32.7 g/dL (ref 30.0–36.0)
Monocytes Relative: 9.4 % (ref 3.0–12.0)
Neutro Abs: 2.2 10*3/uL (ref 1.4–7.7)
Platelets: 328 10*3/uL (ref 150.0–400.0)
RDW: 20.8 % — ABNORMAL HIGH (ref 11.5–14.6)

## 2011-02-14 LAB — HEPATIC FUNCTION PANEL
ALT: 20 U/L (ref 0–35)
AST: 19 U/L (ref 0–37)
Albumin: 3.5 g/dL (ref 3.5–5.2)
Alkaline Phosphatase: 59 U/L (ref 39–117)
Bilirubin, Direct: 0 mg/dL (ref 0.0–0.3)
Total Protein: 6.6 g/dL (ref 6.0–8.3)

## 2011-02-14 LAB — LIPID PANEL
Cholesterol: 149 mg/dL (ref 0–200)
Triglycerides: 111 mg/dL (ref 0.0–149.0)

## 2011-02-16 NOTE — Telephone Encounter (Signed)
I have seen her since. TS

## 2011-02-17 ENCOUNTER — Encounter (HOSPITAL_COMMUNITY): Payer: BC Managed Care – PPO

## 2011-02-19 ENCOUNTER — Encounter (HOSPITAL_COMMUNITY): Payer: BC Managed Care – PPO

## 2011-02-19 ENCOUNTER — Ambulatory Visit: Payer: Self-pay | Admitting: Cardiothoracic Surgery

## 2011-02-21 ENCOUNTER — Encounter (HOSPITAL_COMMUNITY): Payer: BC Managed Care – PPO

## 2011-02-24 ENCOUNTER — Other Ambulatory Visit: Payer: Self-pay | Admitting: Cardiology

## 2011-02-24 ENCOUNTER — Encounter (HOSPITAL_COMMUNITY): Payer: BC Managed Care – PPO

## 2011-02-24 MED ORDER — METOPROLOL TARTRATE 25 MG PO TABS: 75.0000 mg | ORAL_TABLET | Freq: Two times a day (BID) | ORAL | Status: DC

## 2011-02-24 NOTE — Telephone Encounter (Signed)
New Msg: Pt would like 90 day supply of metoprolol. Please return pt call to inform when RX is called in.

## 2011-02-26 ENCOUNTER — Telehealth: Payer: Self-pay | Admitting: Cardiology

## 2011-02-26 ENCOUNTER — Encounter (HOSPITAL_COMMUNITY): Payer: BC Managed Care – PPO | Attending: Cardiology

## 2011-02-26 ENCOUNTER — Encounter (HOSPITAL_COMMUNITY): Payer: BC Managed Care – PPO

## 2011-02-26 ENCOUNTER — Encounter: Payer: Self-pay | Admitting: Cardiothoracic Surgery

## 2011-02-26 ENCOUNTER — Ambulatory Visit (INDEPENDENT_AMBULATORY_CARE_PROVIDER_SITE_OTHER): Payer: BC Managed Care – PPO | Admitting: Cardiothoracic Surgery

## 2011-02-26 ENCOUNTER — Telehealth: Payer: Self-pay

## 2011-02-26 VITALS — BP 123/84 | HR 68 | Resp 16 | Ht 67.5 in | Wt 248.0 lb

## 2011-02-26 DIAGNOSIS — I2109 ST elevation (STEMI) myocardial infarction involving other coronary artery of anterior wall: Secondary | ICD-10-CM | POA: Insufficient documentation

## 2011-02-26 DIAGNOSIS — J45909 Unspecified asthma, uncomplicated: Secondary | ICD-10-CM | POA: Insufficient documentation

## 2011-02-26 DIAGNOSIS — Z8249 Family history of ischemic heart disease and other diseases of the circulatory system: Secondary | ICD-10-CM | POA: Insufficient documentation

## 2011-02-26 DIAGNOSIS — D689 Coagulation defect, unspecified: Secondary | ICD-10-CM | POA: Insufficient documentation

## 2011-02-26 DIAGNOSIS — Z09 Encounter for follow-up examination after completed treatment for conditions other than malignant neoplasm: Secondary | ICD-10-CM

## 2011-02-26 DIAGNOSIS — Z5189 Encounter for other specified aftercare: Secondary | ICD-10-CM | POA: Insufficient documentation

## 2011-02-26 DIAGNOSIS — I10 Essential (primary) hypertension: Secondary | ICD-10-CM | POA: Insufficient documentation

## 2011-02-26 DIAGNOSIS — I2510 Atherosclerotic heart disease of native coronary artery without angina pectoris: Secondary | ICD-10-CM

## 2011-02-26 DIAGNOSIS — E669 Obesity, unspecified: Secondary | ICD-10-CM | POA: Insufficient documentation

## 2011-02-26 DIAGNOSIS — I2542 Coronary artery dissection: Secondary | ICD-10-CM

## 2011-02-26 DIAGNOSIS — D509 Iron deficiency anemia, unspecified: Secondary | ICD-10-CM | POA: Insufficient documentation

## 2011-02-26 DIAGNOSIS — Z951 Presence of aortocoronary bypass graft: Secondary | ICD-10-CM | POA: Insufficient documentation

## 2011-02-26 NOTE — Progress Notes (Signed)
:                                                            [address]            [place] [address]          [phone]    HPI   The patient returns for 5 month followup after emergency coronary bypass grafting times one to the distal LAD for spontaneous coronary dissection. She's completed the rehabilitation program. She is doing well. She has no recurrent angina. Her blood pressure and cholesterol has been monitored carefully by her cardiologist Dr. Tedra Senegal. Incision is well-healed. She is ready to return to work at the [employer]. Her current lifting restriction is 20 pounds.  The patient is anxious to return to work and I feel that her medical condition is consistent with return to work. She was provided documentation same she can lift lift up to 25 pounds for the next year. Her work hours we limited A. hours for the first 3 months and she can extend the shift limit to 12 hours as needed.   Current Outpatient Prescriptions  Medication Sig Dispense Refill  . amLODipine (NORVASC) 5 MG tablet Take 1 tablet (5 mg total) by mouth daily.  30 tablet  11  . aspirin EC 81 MG tablet Take 1 tablet (81 mg total) by mouth daily.      . famotidine (PEPCID) 20 MG tablet Take 1 tablet (20 mg total) by mouth 2 (two) times daily.  60 tablet  11  . ferrous sulfate (IRON SUPPLEMENT) 325 (65 FE) MG tablet Take 1 tablet (325 mg total) by mouth daily.  90 tablet  3  . metoprolol tartrate (LOPRESSOR) 25 MG tablet Take 3 tablets (75 mg total) by mouth 2 (two) times daily.  180 tablet  6  . simvastatin (ZOCOR) 20 MG tablet Take 1 tablet (20 mg total) by mouth at bedtime.  90 tablet  3  . traMADol (ULTRAM) 50 MG tablet Take 50 mg by mouth every 6 (six) hours as needed.        Marland Kitchen DISCONTD: famotidine (PEPCID) 20 MG tablet Take 1 tablet (20 mg total) by mouth 2 (two) times daily.  60 tablet  11     Physical Exam: Blood pressure 123/84 pulse 68 regular saturation 99% weight 248  height 5 foot 7 General appearance that of a middle-aged black female no acute distress Breath sounds clear and equal Sternal incision well-healed nontender Cardiac rhythm regular without murmur or gallop Leg incision well-healed no pedal edema   Diagnostic Tests:No chest x-rays today    Impression: She appears to be well recovered after emergency bypass surgery is able to return to work with the above noted restrictions. She will return here as needed  Plan: Return when necessary

## 2011-02-26 NOTE — Telephone Encounter (Signed)
Walk in Pt Form " There were some Discrepancies in Return to Work Note"  Sent to [employer] Nurse  02/26/11/KM

## 2011-02-26 NOTE — Telephone Encounter (Signed)
Pt states her back to work date is wrong.  She needs one that states she can not go back to work until 03/10/11.  She is requesting a call from Leotis Shames, Dr Rosalyn Charters nurse regarding this matter.

## 2011-02-26 NOTE — Patient Instructions (Signed)
Continue an aspirin and Zocor indefinitely

## 2011-02-27 NOTE — Telephone Encounter (Signed)
I spoke with the pt and made her aware that per documentation from her appointment with Dr Donata Clay yesterday he has also released her to return to work. The pt said that Dr Donata Clay gave her a note stating that she could return to work on 03/10/11.  I made her aware that we had already given her a note to RTW on 03/03/11 and if she could provide a copy of the note from Dr Donata Clay then we can change the date we have on file.  The pt said that she would bring the note into the office on 02/28/11.

## 2011-02-27 NOTE — Telephone Encounter (Signed)
Follow-up:     Patient is calling back to follow up on the call she placed yesterday. Please advise.

## 2011-02-28 ENCOUNTER — Encounter (HOSPITAL_COMMUNITY): Payer: BC Managed Care – PPO

## 2011-02-28 ENCOUNTER — Telehealth: Payer: Self-pay | Admitting: Cardiology

## 2011-02-28 NOTE — Telephone Encounter (Signed)
The pt came into the office and brought note from Dr Donata Clay.  I updated our return to work letter and gave this to the patient.

## 2011-02-28 NOTE — Telephone Encounter (Signed)
Fax# [phone] this is to [employer]  Claim # [id number]

## 2011-02-28 NOTE — Telephone Encounter (Signed)
02/28/11 letter faxed.

## 2011-03-03 ENCOUNTER — Encounter (HOSPITAL_COMMUNITY): Payer: BC Managed Care – PPO

## 2011-03-03 NOTE — Progress Notes (Addendum)
Cardiac Rehabilitation Program Progress Report   Orientation:  12/05/2010 Graduate Date:   Discharge Date:  02/14/2011 # of sessions completed: 20  Cardiologist: Tonette Bihari MD:  Duncan Dull Class Time:  1315  A.  Exercise Program:  Tolerates exercise @ 3.3 METS for 30 minutes and Discharged to home exercise program.  Anticipated compliance:  fair  B.  Mental Health:  Good mental attitude  C.  Education/Instruction/Skills  Uses Perceived Exertion Scale and/or Dyspnea Scale and Attended 8 education classes    D.  Nutrition/Weight Control/Body Composition:  Patient has gained 1.1 kg, post BMI = 37.9  *This section completed by Mickle Plumb, Andres Shad, RD, LDN, CDE  E.  Blood Lipids    Lab Results  Component Value Date   CHOL 149 02/14/2011     Lab Results  Component Value Date   TRIG 111.0 02/14/2011     Lab Results  Component Value Date   HDL 58.40 02/14/2011     Lab Results  Component Value Date   CHOLHDL 3 02/14/2011     No results found for this basename: LDLDIRECT      F.  Lifestyle Changes:  Making positive lifestyle changes  G.  Symptoms noted with exercise:  Asymptomatic  Report Completed By:  Dayton Martes   Comments:  Pt graduated early due to out of pocket expense associated with  2013 calender year deductible.  Pt will continue exercise in Cardiac Rehab Maintenance program.  Pt completed 20 monitored sessions in 8 weeks. Average attendance for exercise and education classes.   Pt VSS.  Telemetry-sinus rhythm with occasional PVC.  Pt c/o sharp discomfort once with exercise.  This sx was evaluated by cardiology and felt to be non ischemic.  Pt was a pleasure to have in the program.  Thank you for the referral.

## 2011-03-05 ENCOUNTER — Encounter (HOSPITAL_COMMUNITY): Payer: BC Managed Care – PPO

## 2011-03-05 ENCOUNTER — Encounter: Payer: Self-pay | Admitting: Cardiology

## 2011-03-05 ENCOUNTER — Ambulatory Visit (INDEPENDENT_AMBULATORY_CARE_PROVIDER_SITE_OTHER): Payer: BC Managed Care – PPO | Admitting: Cardiology

## 2011-03-05 DIAGNOSIS — I2542 Coronary artery dissection: Secondary | ICD-10-CM

## 2011-03-05 DIAGNOSIS — D649 Anemia, unspecified: Secondary | ICD-10-CM

## 2011-03-05 DIAGNOSIS — I10 Essential (primary) hypertension: Secondary | ICD-10-CM

## 2011-03-05 DIAGNOSIS — R5383 Other fatigue: Secondary | ICD-10-CM

## 2011-03-05 DIAGNOSIS — R5381 Other malaise: Secondary | ICD-10-CM

## 2011-03-05 NOTE — Patient Instructions (Signed)
Your physician has recommended that you have a sleep study. This test records several body functions during sleep, including: brain activity, eye movement, oxygen and carbon dioxide blood levels, heart rate and rhythm, breathing rate and rhythm, the flow of air through your mouth and nose, snoring, body muscle movements, and chest and belly movement.  Your physician recommends that you return for lab work in: 1 MONTH with CBC  Your physician recommends that you schedule a follow-up appointment in: 2 MONTHS  Your physician recommends that you continue on your current medications as directed. Please refer to the Current Medication list given to you today.

## 2011-03-07 ENCOUNTER — Encounter (HOSPITAL_COMMUNITY): Payer: BC Managed Care – PPO

## 2011-03-07 ENCOUNTER — Telehealth: Payer: Self-pay

## 2011-03-07 NOTE — Telephone Encounter (Signed)
I left a message at [phone] (9 am) this morning for the pt to contact our office about her return to work status.  The pt walked into the office to discuss her work status.  I was working with Dr Excell Seltzer at the time and the front desk made her aware that I was working with an MD and that I would be out once I had a break in the schedule.  I went out to the front and brought the pt and her husband back to a room.  After a long discussion with the pt a letter was written to keep the pt out of work to do the maintenance cardiac rehab program.  The pt cannot afford the cost to complete the monitored cardiac rehab program.  The pt feels that even with the restrictions that have been given that she cannot function at her job.  The pt will continue routine follow-up with Dr Riley Kill.

## 2011-03-07 NOTE — Telephone Encounter (Signed)
Message copied by Iona Coach on Fri Mar 07, 2011  1:01 PM ------      Message from: Robyne Peers      Created: Fri Mar 07, 2011  8:06 AM      Regarding: Cardiac Rehab       Dr. Riley Kill,            Pt states she would like to return to cardiac rehab to continue the remainder of her sessions(max 16 sessions left).  Pt graduated early at the end of 2012 due to new deductible for the 2013 year.  Pt will check with her insurance company to verify her out of pocket expense prior to returning to the program.  Would you agree with patient resuming cardiac rehab?             FYI... Pt also states she does not feel comfortable returning to work until after she has completed the program.  Pt was advised of all class times available to allow her the opportunity to  work and attend rehab, however she states she does not feel that she has the stamina to perform her job duties at the 95-100% functional  capacity requirement by her employer.              Thank you,      Deveron Furlong, RN      Cardiac Pulmonary Rehab

## 2011-03-09 NOTE — Assessment & Plan Note (Signed)
She remains anemic.  Has heavy periods.  I reminded her to stay on her iron, but that her stools should be checked to be complete.  Also recommended a multivitamin with folic acid.

## 2011-03-09 NOTE — Assessment & Plan Note (Signed)
She is on medical therapy.  Perhaps not ideally controlled.  She could have OSA, and might benefit from a sleep study.  I will plan to proceed with this with her approval.

## 2011-03-09 NOTE — Progress Notes (Signed)
HPI:  Ashlee Stein is in for follow up.  We reviewed her course, and her lab findings, and requests for further studies in detail.  She has been cleared by Dr. PVT to return to work, but feels she is not ready.  She would like to go back in to the rehab program at [facility].  She missed quite a few sessions, and thinks this would really help her regain some of her strength.  Regarding her periods, she says she has them, and they tend to stay rather heavy.  She has some chest pain, but it tends to be sharp, somewhat brief, and does not sound ischemic in nature.  She is currently being followed.  We went back over her SCAD, and the decision making at the time.  I have also referred her to several web sites.  She denies any blood in her stool, or other sources of potential blood loss.    Current Outpatient Prescriptions  Medication Sig Dispense Refill  . amLODipine (NORVASC) 5 MG tablet Take 1 tablet (5 mg total) by mouth daily.  30 tablet  11  . aspirin EC 81 MG tablet Take 1 tablet (81 mg total) by mouth daily.      . famotidine (PEPCID) 20 MG tablet Take 1 tablet (20 mg total) by mouth 2 (two) times daily.  60 tablet  11  . ferrous sulfate (IRON SUPPLEMENT) 325 (65 FE) MG tablet Take 1 tablet (325 mg total) by mouth daily.  90 tablet  3  . metoprolol tartrate (LOPRESSOR) 25 MG tablet Take 3 tablets (75 mg total) by mouth 2 (two) times daily.  180 tablet  6  . simvastatin (ZOCOR) 20 MG tablet Take 1 tablet (20 mg total) by mouth at bedtime.  90 tablet  3  . DISCONTD: famotidine (PEPCID) 20 MG tablet Take 1 tablet (20 mg total) by mouth 2 (two) times daily.  60 tablet  11    No Known Allergies  Past Medical History  Diagnosis Date  . Hypertension   . Iron deficiency anemia   . Obesity   . Coronary artery dissection     NSTEMI 8/12: cath with mid to dist LAD spont. dissection, unable to do PCI and referred for CABG:  SVG to LAD (Dr. PVT)  . Dyslipidemia     No past surgical history on  file.  Family History  Problem Relation Age of Onset  . Heart attack Sister     History   Social History  . Marital Status: Married    Spouse Name: N/A    Number of Children: N/A  . Years of Education: N/A   Occupational History  . Not on file.   Social History Main Topics  . Smoking status: Never Smoker   . Smokeless tobacco: Not on file  . Alcohol Use: No  . Drug Use: No  . Sexually Active:    Other Topics Concern  . Not on file   Social History Narrative  . No narrative on file    ROS: Please see the HPI.  All other systems reviewed and negative.  PHYSICAL EXAM:  BP 138/90  Pulse 74  Ht 5\' 7"  (1.702 m)  Wt 115.214 kg (254 lb)  BMI 39.78 kg/m2  General: Well developed, well nourished, in no acute distress. Head:  Normocephalic and atraumatic. Neck: no JVD Lungs: Clear to auscultation and percussion. Heart: Normal S1 and S2.  No murmur, rubs or gallops.  Abdomen:  Normal bowel sounds; soft;  non tender; no organomegaly Pulses: Pulses normal in all 4 extremities. Extremities: No clubbing or cyanosis. No edema. Neurologic: Alert and oriented x 3.  EKG:  ASSESSMENT AND PLAN:

## 2011-03-09 NOTE — Assessment & Plan Note (Signed)
Patient has had some chest pain, but atypical.  She is currently stable, but wants to reenter the rehab program.  This seems reasonable to me,and allow her to enter.  She presented with SCAD, and the apical vessel was moderately large,  We were unable to cross with a wire, and though she was stable, we felt that revascularizing the apical vessel made the most sense.  She has had some post op symptoms, but her LVEF is normal, and I am inclined to continue her in the rehab program with appropriate support.

## 2011-03-10 ENCOUNTER — Encounter (HOSPITAL_COMMUNITY): Payer: BC Managed Care – PPO

## 2011-03-11 ENCOUNTER — Encounter (HOSPITAL_COMMUNITY)
Admission: RE | Admit: 2011-03-11 | Discharge: 2011-03-11 | Disposition: A | Discharge status: Home / Self Care | Payer: Self-pay | Source: Ambulatory Visit | Attending: Cardiology | Admitting: Cardiology

## 2011-03-11 DIAGNOSIS — I10 Essential (primary) hypertension: Secondary | ICD-10-CM | POA: Insufficient documentation

## 2011-03-11 DIAGNOSIS — I2542 Coronary artery dissection: Secondary | ICD-10-CM | POA: Insufficient documentation

## 2011-03-11 DIAGNOSIS — I252 Old myocardial infarction: Secondary | ICD-10-CM | POA: Insufficient documentation

## 2011-03-11 DIAGNOSIS — E785 Hyperlipidemia, unspecified: Secondary | ICD-10-CM | POA: Insufficient documentation

## 2011-03-11 DIAGNOSIS — D649 Anemia, unspecified: Secondary | ICD-10-CM | POA: Insufficient documentation

## 2011-03-11 DIAGNOSIS — Z951 Presence of aortocoronary bypass graft: Secondary | ICD-10-CM | POA: Insufficient documentation

## 2011-03-11 DIAGNOSIS — Z5189 Encounter for other specified aftercare: Secondary | ICD-10-CM | POA: Insufficient documentation

## 2011-03-12 ENCOUNTER — Encounter (HOSPITAL_COMMUNITY): Payer: Self-pay

## 2011-03-12 ENCOUNTER — Encounter (HOSPITAL_COMMUNITY): Payer: BC Managed Care – PPO

## 2011-03-13 ENCOUNTER — Encounter (HOSPITAL_COMMUNITY)
Admission: RE | Admit: 2011-03-13 | Discharge: 2011-03-13 | Disposition: A | Discharge status: Home / Self Care | Payer: Self-pay | Source: Ambulatory Visit | Attending: Cardiology | Admitting: Cardiology

## 2011-03-14 ENCOUNTER — Encounter (HOSPITAL_COMMUNITY): Payer: BC Managed Care – PPO

## 2011-03-17 ENCOUNTER — Encounter (HOSPITAL_COMMUNITY): Payer: BC Managed Care – PPO

## 2011-03-18 ENCOUNTER — Telehealth: Payer: Self-pay | Admitting: Cardiology

## 2011-03-18 ENCOUNTER — Encounter (HOSPITAL_COMMUNITY)
Admission: RE | Admit: 2011-03-18 | Discharge: 2011-03-18 | Disposition: A | Discharge status: Home / Self Care | Payer: Self-pay | Source: Ambulatory Visit | Attending: Cardiology | Admitting: Cardiology

## 2011-03-18 NOTE — Telephone Encounter (Signed)
I spoke with the pt and made her aware that these should be taken to the [place] office on [facility] [address]. The pt will drop them off tomorrow.

## 2011-03-18 NOTE — Telephone Encounter (Signed)
New problem Pt said she was given stool cards and she doesn't know where to bring them.  Please call her back

## 2011-03-19 ENCOUNTER — Encounter (HOSPITAL_COMMUNITY): Payer: BC Managed Care – PPO

## 2011-03-19 ENCOUNTER — Encounter (HOSPITAL_COMMUNITY)
Admission: RE | Admit: 2011-03-19 | Discharge: 2011-03-19 | Disposition: A | Discharge status: Home / Self Care | Payer: Self-pay | Source: Ambulatory Visit | Attending: Cardiology | Admitting: Cardiology

## 2011-03-19 ENCOUNTER — Encounter (HOSPITAL_BASED_OUTPATIENT_CLINIC_OR_DEPARTMENT_OTHER): Payer: BC Managed Care – PPO

## 2011-03-20 ENCOUNTER — Encounter (HOSPITAL_COMMUNITY)
Admission: RE | Admit: 2011-03-20 | Discharge: 2011-03-20 | Disposition: A | Discharge status: Home / Self Care | Payer: Self-pay | Source: Ambulatory Visit | Attending: Cardiology | Admitting: Cardiology

## 2011-03-20 ENCOUNTER — Telehealth: Payer: Self-pay | Admitting: Cardiology

## 2011-03-20 ENCOUNTER — Ambulatory Visit (HOSPITAL_BASED_OUTPATIENT_CLINIC_OR_DEPARTMENT_OTHER): Payer: BC Managed Care – PPO | Attending: Cardiology

## 2011-03-20 NOTE — Telephone Encounter (Signed)
Pt has crown that fell out, she needs to take amoxcycline and toradol before having repair, are these ok to take? pls call [phone]

## 2011-03-20 NOTE — Telephone Encounter (Signed)
Left message to call back  

## 2011-03-20 NOTE — Telephone Encounter (Signed)
I spoke with the pt and she said her crown fell off when she was flossing.  The pt said her dentist was calling in prescriptions for amoxicillin and tramadol.  The pt wanted to make sure that these are okay to take. The pt is having this dental work done with Dr Everardo Beals [phone].  I asked the pt if she would be okay if I contacted Dr Aline August office to clarify what medications were called into pharmacy.  Pt said this would be fine. I spoke with Dr Aline August office and they called in Amoxicillin 500mg  qid for 7 days and Tramadol 50mg  one po every 4-6 hours for pain #16.  I left a message on the pt's voicemail to let her know that these medications are okay for her to take.

## 2011-03-21 ENCOUNTER — Encounter (HOSPITAL_COMMUNITY): Payer: BC Managed Care – PPO

## 2011-03-24 ENCOUNTER — Other Ambulatory Visit: Payer: BC Managed Care – PPO

## 2011-03-24 ENCOUNTER — Encounter (HOSPITAL_COMMUNITY): Payer: BC Managed Care – PPO

## 2011-03-24 LAB — HEMOCCULT SLIDES (X 3 CARDS)
OCCULT 1: NEGATIVE
OCCULT 3: NEGATIVE
OCCULT 4: NEGATIVE

## 2011-03-25 ENCOUNTER — Encounter (HOSPITAL_COMMUNITY): Payer: Self-pay

## 2011-03-26 ENCOUNTER — Encounter (HOSPITAL_COMMUNITY): Payer: BC Managed Care – PPO

## 2011-03-26 ENCOUNTER — Encounter (HOSPITAL_COMMUNITY): Payer: Self-pay

## 2011-03-27 ENCOUNTER — Encounter (HOSPITAL_COMMUNITY)
Admission: RE | Admit: 2011-03-27 | Discharge: 2011-03-27 | Disposition: A | Discharge status: Home / Self Care | Payer: Self-pay | Source: Ambulatory Visit | Attending: Cardiology | Admitting: Cardiology

## 2011-03-27 ENCOUNTER — Encounter: Payer: Self-pay | Admitting: Cardiology

## 2011-03-28 ENCOUNTER — Encounter (HOSPITAL_COMMUNITY): Payer: BC Managed Care – PPO

## 2011-03-28 ENCOUNTER — Encounter (HOSPITAL_COMMUNITY)
Admission: RE | Admit: 2011-03-28 | Discharge: 2011-03-28 | Disposition: A | Discharge status: Home / Self Care | Payer: Self-pay | Source: Ambulatory Visit | Attending: Cardiology | Admitting: Cardiology

## 2011-03-28 DIAGNOSIS — Z951 Presence of aortocoronary bypass graft: Secondary | ICD-10-CM | POA: Insufficient documentation

## 2011-03-28 DIAGNOSIS — I10 Essential (primary) hypertension: Secondary | ICD-10-CM | POA: Insufficient documentation

## 2011-03-28 DIAGNOSIS — E785 Hyperlipidemia, unspecified: Secondary | ICD-10-CM | POA: Insufficient documentation

## 2011-03-28 DIAGNOSIS — D649 Anemia, unspecified: Secondary | ICD-10-CM | POA: Insufficient documentation

## 2011-03-28 DIAGNOSIS — I252 Old myocardial infarction: Secondary | ICD-10-CM | POA: Insufficient documentation

## 2011-03-28 DIAGNOSIS — I2542 Coronary artery dissection: Secondary | ICD-10-CM | POA: Insufficient documentation

## 2011-03-28 DIAGNOSIS — Z5189 Encounter for other specified aftercare: Secondary | ICD-10-CM | POA: Insufficient documentation

## 2011-03-31 ENCOUNTER — Emergency Department (HOSPITAL_COMMUNITY): Payer: BC Managed Care – PPO

## 2011-03-31 ENCOUNTER — Ambulatory Visit (HOSPITAL_COMMUNITY): Payer: BC Managed Care – PPO

## 2011-03-31 ENCOUNTER — Encounter (HOSPITAL_COMMUNITY): Payer: Self-pay | Admitting: Emergency Medicine

## 2011-03-31 ENCOUNTER — Other Ambulatory Visit: Payer: Self-pay

## 2011-03-31 ENCOUNTER — Other Ambulatory Visit (INDEPENDENT_AMBULATORY_CARE_PROVIDER_SITE_OTHER): Payer: BC Managed Care – PPO | Admitting: *Deleted

## 2011-03-31 ENCOUNTER — Telehealth: Payer: Self-pay | Admitting: *Deleted

## 2011-03-31 ENCOUNTER — Observation Stay (HOSPITAL_COMMUNITY)
Admission: EM | Admit: 2011-03-31 | Discharge: 2011-04-01 | Disposition: A | Discharge status: Home / Self Care | Payer: BC Managed Care – PPO | Attending: Cardiology | Admitting: Cardiology

## 2011-03-31 DIAGNOSIS — R079 Chest pain, unspecified: Secondary | ICD-10-CM

## 2011-03-31 DIAGNOSIS — R5383 Other fatigue: Secondary | ICD-10-CM

## 2011-03-31 DIAGNOSIS — I10 Essential (primary) hypertension: Secondary | ICD-10-CM | POA: Insufficient documentation

## 2011-03-31 DIAGNOSIS — D509 Iron deficiency anemia, unspecified: Secondary | ICD-10-CM | POA: Insufficient documentation

## 2011-03-31 DIAGNOSIS — R0789 Other chest pain: Principal | ICD-10-CM | POA: Insufficient documentation

## 2011-03-31 DIAGNOSIS — E669 Obesity, unspecified: Secondary | ICD-10-CM | POA: Insufficient documentation

## 2011-03-31 DIAGNOSIS — R5381 Other malaise: Secondary | ICD-10-CM

## 2011-03-31 DIAGNOSIS — I2542 Coronary artery dissection: Secondary | ICD-10-CM

## 2011-03-31 DIAGNOSIS — Z951 Presence of aortocoronary bypass graft: Secondary | ICD-10-CM | POA: Insufficient documentation

## 2011-03-31 DIAGNOSIS — E785 Hyperlipidemia, unspecified: Secondary | ICD-10-CM | POA: Insufficient documentation

## 2011-03-31 DIAGNOSIS — D649 Anemia, unspecified: Secondary | ICD-10-CM

## 2011-03-31 HISTORY — DX: Angina pectoris, unspecified: I20.9

## 2011-03-31 LAB — CBC
HCT: 29.2 % — ABNORMAL LOW (ref 36.0–46.0)
Hemoglobin: 10.3 g/dL — ABNORMAL LOW (ref 12.0–15.0)
MCH: 26.2 pg (ref 26.0–34.0)
MCH: 25.3 pg — ABNORMAL LOW (ref 26.0–34.0)
MCHC: 33.2 g/dL (ref 30.0–36.0)
MCHC: 32.5 g/dL (ref 30.0–36.0)
MCV: 78.9 fL (ref 78.0–100.0)
RBC: 3.93 MIL/uL (ref 3.87–5.11)
RDW: 19.5 % — ABNORMAL HIGH (ref 11.5–15.5)

## 2011-03-31 LAB — CBC WITH DIFFERENTIAL/PLATELET
Basophils Relative: 1 % (ref 0.0–3.0)
Eosinophils Absolute: 0.2 10*3/uL (ref 0.0–0.7)
Eosinophils Relative: 4.3 % (ref 0.0–5.0)
HCT: 28.3 % — ABNORMAL LOW (ref 36.0–46.0)
Lymphs Abs: 1.4 10*3/uL (ref 0.7–4.0)
MCHC: 33.6 g/dL (ref 30.0–36.0)
MCV: 78.8 fl (ref 78.0–100.0)
Monocytes Absolute: 0.5 10*3/uL (ref 0.1–1.0)
Neutrophils Relative %: 51.9 % (ref 43.0–77.0)
Platelets: 324 10*3/uL (ref 150.0–400.0)
RBC: 3.59 Mil/uL — ABNORMAL LOW (ref 3.87–5.11)
WBC: 4.6 10*3/uL (ref 4.5–10.5)

## 2011-03-31 LAB — PROTIME-INR
INR: 1.01 (ref 0.00–1.49)
Prothrombin Time: 13.5 seconds (ref 11.6–15.2)

## 2011-03-31 LAB — DIFFERENTIAL
Basophils Relative: 1 % (ref 0–1)
Eosinophils Absolute: 0.2 10*3/uL (ref 0.0–0.7)
Eosinophils Relative: 5 % (ref 0–5)
Lymphs Abs: 1.7 10*3/uL (ref 0.7–4.0)
Monocytes Absolute: 0.4 10*3/uL (ref 0.1–1.0)
Monocytes Relative: 9 % (ref 3–12)

## 2011-03-31 LAB — COMPREHENSIVE METABOLIC PANEL
Albumin: 3.4 g/dL — ABNORMAL LOW (ref 3.5–5.2)
Alkaline Phosphatase: 69 U/L (ref 39–117)
BUN: 11 mg/dL (ref 6–23)
Calcium: 9.1 mg/dL (ref 8.4–10.5)
Creatinine, Ser: 1.06 mg/dL (ref 0.50–1.10)
GFR calc Af Amer: 72 mL/min — ABNORMAL LOW (ref >90)
Glucose, Bld: 101 mg/dL — ABNORMAL HIGH (ref 70–99)
Total Protein: 7.1 g/dL (ref 6.0–8.3)

## 2011-03-31 LAB — CREATININE, SERUM: GFR calc non Af Amer: 66 mL/min — ABNORMAL LOW (ref >90)

## 2011-03-31 LAB — APTT: aPTT: 28 seconds (ref 24–37)

## 2011-03-31 MED ORDER — AMLODIPINE BESYLATE 5 MG PO TABS
5.0000 mg | ORAL_TABLET | Freq: Every day | ORAL | Status: DC
  Administered 2011-03-31 – 2011-04-01 (×2): 5 mg via ORAL
  Filled 2011-03-31 (×2): qty 1

## 2011-03-31 MED ORDER — ASPIRIN 81 MG PO CHEW: 324.0000 mg | CHEWABLE_TABLET | ORAL | Status: DC

## 2011-03-31 MED ORDER — ASPIRIN EC 81 MG PO TBEC
81.0000 mg | DELAYED_RELEASE_TABLET | Freq: Every day | ORAL | Status: DC
  Administered 2011-04-01: 81 mg via ORAL
  Filled 2011-03-31: qty 1

## 2011-03-31 MED ORDER — MORPHINE SULFATE 2 MG/ML IJ SOLN
2.0000 mg | Freq: Once | INTRAMUSCULAR | Status: DC
  Filled 2011-03-31: qty 1

## 2011-03-31 MED ORDER — ONDANSETRON HCL 4 MG/2ML IJ SOLN: 4.0000 mg | Freq: Four times a day (QID) | INTRAMUSCULAR | Status: DC | PRN

## 2011-03-31 MED ORDER — ALPRAZOLAM 0.25 MG PO TABS: 0.2500 mg | ORAL_TABLET | Freq: Two times a day (BID) | ORAL | Status: DC | PRN

## 2011-03-31 MED ORDER — NITROGLYCERIN 0.4 MG SL SUBL: 0.4000 mg | SUBLINGUAL_TABLET | SUBLINGUAL | Status: DC | PRN

## 2011-03-31 MED ORDER — SIMVASTATIN 20 MG PO TABS
20.0000 mg | ORAL_TABLET | Freq: Every day | ORAL | Status: DC
  Administered 2011-03-31: 20 mg via ORAL
  Filled 2011-03-31 (×2): qty 1

## 2011-03-31 MED ORDER — ENOXAPARIN SODIUM 40 MG/0.4ML ~~LOC~~ SOLN
40.0000 mg | Freq: Every day | SUBCUTANEOUS | Status: DC
  Administered 2011-03-31: 40 mg via SUBCUTANEOUS
  Filled 2011-03-31 (×2): qty 0.4

## 2011-03-31 MED ORDER — ADULT MULTIVITAMIN W/MINERALS CH
1.0000 | ORAL_TABLET | Freq: Every day | ORAL | Status: DC
  Administered 2011-03-31 – 2011-04-01 (×2): 1 via ORAL
  Filled 2011-03-31 (×2): qty 1

## 2011-03-31 MED ORDER — SODIUM CHLORIDE 0.9 % IJ SOLN: 3.0000 mL | INTRAMUSCULAR | Status: DC | PRN

## 2011-03-31 MED ORDER — SODIUM CHLORIDE 0.9 % IV SOLN: 250.0000 mL | INTRAVENOUS | Status: DC | PRN

## 2011-03-31 MED ORDER — SODIUM CHLORIDE 0.9 % IJ SOLN
3.0000 mL | Freq: Two times a day (BID) | INTRAMUSCULAR | Status: DC
  Administered 2011-03-31 – 2011-04-01 (×2): 3 mL via INTRAVENOUS

## 2011-03-31 MED ORDER — ACETAMINOPHEN 325 MG PO TABS: 650.0000 mg | ORAL_TABLET | ORAL | Status: DC | PRN

## 2011-03-31 MED ORDER — METOPROLOL TARTRATE 25 MG PO TABS
75.0000 mg | ORAL_TABLET | Freq: Two times a day (BID) | ORAL | Status: DC
  Administered 2011-03-31 – 2011-04-01 (×2): 75 mg via ORAL
  Filled 2011-03-31 (×3): qty 1

## 2011-03-31 MED ORDER — FAMOTIDINE 20 MG PO TABS
20.0000 mg | ORAL_TABLET | Freq: Two times a day (BID) | ORAL | Status: DC | PRN
  Filled 2011-03-31: qty 1

## 2011-03-31 NOTE — Telephone Encounter (Signed)
03/31/11--   I was  called to front desk to talk to pt --she had no appoint--was a walk in--pt states she is having CP, which radiates to under right chest to back--"the same type of pain I had before I had open heart surgery"--I advised pt that dr Riley Kill not in office today, and to proceerd to ED to be checked--pt agrees--trish notified--nt

## 2011-03-31 NOTE — ED Notes (Signed)
Called to give report to [phone]. Nurse unavailable. Awaiting return call.  

## 2011-03-31 NOTE — H&P (Signed)
History and Physical  Patient ID: Ashlee Stein MRN: [id number], SOB: 05-26-1965 46 y.o. Date of Encounter: 03/31/2011, 3:49 PM  Primary Physician: Avon Gully, MD Primary Cardiologist: Shawnie Pons MD  Chief Complaint: Chest pain  HPI: 46 y.o. female w/ PMHx significant for HTN, HLD, obesity, and coronary artery dissection (s/p CABG - SVG to LAD 09/2010) who presented to [facility] on 03/31/2011 with complaints of chest pain.  During an office follow up last December she c/o of similar chest pain and underwent an exercise stress test that was normal without evidence of ischemia. Was last seen in clinic by Dr. Riley Kill on 03/05/11 at which time she reported occasional chest pain that was sharp and did not sound ischemic in nature. She has done well since until she was in the shower this morning and felt sudden onset dull substernal chest pain that lasted about 10 min and self resolved. No associated symptoms. She continued to get ready and had her husband drive her to clinic for schedule lab work. In the car she had sudden onset right sided sharp chest pain that was similar to the pain that she had prior to her coronary artery dissection last august. It lasted about one minute, did not radiate, and wasn't associated with shortness of breath, nausea, or diaphoresis. While in clinic she reported her chest pain and was instructed to present to the ED for further evaluation. She participates in cardiac rehab and denies any exertional symptoms. Denies any recent illness, fever, chills, cough, changes in bladder or bowel.  In the ED, EKG revealed NSR with no significant ST changes compared to previous EKG. CXR was without acute cardiopulmonary abnormalities. Initial poc troponin negative. She is currently chest pain free.   Past Medical History  Diagnosis Date  . Hypertension   . Iron deficiency anemia   . Obesity   . Coronary artery dissection     NSTEMI 8/12: cath with mid to dist  LAD spont. dissection, unable to do PCI and referred for CABG:  SVG to LAD (Dr. PVT)  . Dyslipidemia      Surgical History:  Past Surgical History  Procedure Date  . Coronary artery bypass graft aug 2012    coronary artery dissection -->CABG:  SVG to LAD (Dr. PVT)     Home Meds: Medication Sig  amLODipine (NORVASC) 5 MG tablet Take 5 mg by mouth daily.  aspirin EC 81 MG tablet Take 1 tablet (81 mg total) by mouth daily.  famotidine (PEPCID) 20 MG tablet Take 20 mg by mouth 2 (two) times daily as needed. Indigestion  metoprolol tartrate (LOPRESSOR) 25 MG tablet Take 75 mg by mouth 2 (two) times daily.  Multiple Vitamin (MULITIVITAMIN WITH MINERALS) TABS Take 1 tablet by mouth daily.  simvastatin (ZOCOR) 20 MG tablet Take 20 mg by mouth at bedtime.    Allergies: No Known Allergies  Social History  . Marital Status: Married   Social History Main Topics  . Smoking status: Never Smoker   . Alcohol Use: No  . Drug Use: No   Family History  Problem Relation Age of Onset  . Heart attack Sister    Review of Systems: General: negative for chills, fever, night sweats or weight changes.  Cardiovascular: (+) chest pain; negative for dyspnea on exertion, edema, orthopnea, palpitations, paroxysmal nocturnal dyspnea or shortness of breath Dermatological: negative for rash Respiratory: negative for cough or wheezing Urologic: negative for hematuria Abdominal: negative for nausea, vomiting, diarrhea, bright red blood per rectum,  melena, or hematemesis Neurologic: negative for visual changes, syncope, or dizziness All other systems reviewed and are otherwise negative except as noted above.  Labs:   Lab Results  Component Value Date   WBC 4.9 03/31/2011   HGB 10.3* 03/31/2011   HCT 31.0* 03/31/2011   MCV 78.9 03/31/2011   PLT 331 03/31/2011     Lab 03/31/11 1419  NA 136  K 3.9  CL 104  CO2 24  BUN 11  CREATININE 1.06  CALCIUM 9.1  PROT 7.1  BILITOT 0.1*  ALKPHOS 69  ALT 15  AST  19  GLUCOSE 101*   Basename 03/31/11 1417  TROPONINI <0.30    Radiology/Studies:  03/31/11 CXR Findings: There has been previous median sternotomy and CABG. The heart is at the upper limits normal in size. The vascularity is normal. Lungs are clear. No effusions. No focal parenchymal pathology. No significant bony finding.  IMPRESSION: Previous CABG. No active disease.   EKG: 03/31/11 @ 1339NSR with no significant ST changes compared to previous EKG  Physical Exam: Blood pressure 122/80, pulse 68, temperature 97.8 F (36.6 C), resp. rate 20, last menstrual period 03/24/2011, SpO2 100.00%. General: Well developed, well nourished, overweight black female in no acute distress. Head: Normocephalic, atraumatic, sclera non-icteric, nares are without discharge Neck: Supple. Negative for carotid bruits. JVD not elevated. Lungs: Clear bilaterally to auscultation without wheezes, rales, or rhonchi. Breathing is unlabored. Heart: RRR with S1 S2. No murmurs, rubs, or gallops appreciated. Abdomen: Soft, non-tender, non-distended with normoactive bowel sounds. No rebound/guarding. No obvious abdominal masses. Msk:  Strength and tone appear normal for age. Extremities: Trace bilat LE edema. No clubbing or cyanosis. Distal pedal pulses are 2+ and equal bilaterally. Neuro: Alert and oriented X 3. Moves all extremities spontaneously. Psych:  Responds to questions appropriately with a normal affect.   ASSESSMENT AND PLAN:  46 y.o. female w/ PMHx significant for HTN, HLD, obesity, and coronary artery dissection (s/p CABG - SVG to LAD 09/2010) who presented to [facility] on 03/31/2011 with complaints of chest pain.  1. Chest pain w/ h/o coronary artery dissection: Her right sided sharp chest pain was similar to the pain she felt prior to her coronary artery dissection in August last year. She is currently chest pain free. EKG is without acute ischemic changes, CXR is without acute cardiopulmonary changes,  and initial troponin negative. We will admit her for overnight observation, monitor her on telemetry and cycle cardiac enzymes.  2. Hypertension: BP stable. Cont home meds  3. Hyperlipidemia: LDL 68 in 12/12. Cont statin  Signed, HOPE, JESSICA PA-C 03/31/2011, 3:49 PM  Patient is extremely well known to me.  She has prior SCAD with urgent CABG to the distal LAD.  She has had intermittent chest pain since her original dc, but her echo revealed normal LV, and stress test was adequate.  She has been back in the graduate program for rehab.  Today she had a couple of episodes of chest pain which scared her.  One was sharp and to the right, and lasted only a couple of seconds.  The left parasternal discomfort lingered a bit longer.  Both are gone.  She was not diaphoretic, nor did she have nausea.  It did concern her.  She also has chronic anemia.     Her exam is benign, and there are not definitive ECG changes.  We plan overnight observation with serial enzymes.  If all neg, would lean toward conservative management.  I reviewed  with she and her husband in great detail.   Shawnie Pons 5:14 PM 03/31/2011

## 2011-03-31 NOTE — ED Notes (Signed)
Admission MD at bedside. NAD noted.

## 2011-03-31 NOTE — ED Notes (Signed)
Was at  heart care for blood work and told then she had had cp last night and this am  Pain is gone no. No n/v had felt some palpitaions before but not now

## 2011-03-31 NOTE — ED Provider Notes (Addendum)
History     CSN: [id number]  Arrival date & time 03/31/11  1332   First MD Initiated Contact with Patient 03/31/11 1409      Chief Complaint  Patient presents with  . Chest Pain    (Consider location/radiation/quality/duration/timing/severity/associated sxs/prior treatment) Patient is a 46 y.o. female presenting with chest pain. The history is provided by the patient.  Chest Pain The chest pain began yesterday. Chest pain occurs intermittently. The quality of the pain is described as pressure-like. The pain does not radiate. Pertinent negatives for primary symptoms include no fever, no shortness of breath, no cough, no wheezing, no palpitations, no abdominal pain, no nausea, no vomiting and no dizziness.  Pertinent negatives for associated symptoms include no lower extremity edema, no numbness and no weakness.   In 8/12 pt had dissecting LAD which required bypass surgery. She is followed by Dr Riley Kill. Presents today with episodic chest pain starting last night. Went in to see cardiologist and was told to go to ED for evaluation. Pain is improved currently.   Past Medical History  Diagnosis Date  . Hypertension   . Iron deficiency anemia   . Obesity   . Coronary artery dissection     NSTEMI 8/12: cath with mid to dist LAD spont. dissection, unable to do PCI and referred for CABG:  SVG to LAD (Dr. PVT)  . Dyslipidemia     Past Surgical History  Procedure Date  . Coronary artery bypass graft aug 2012    coronary artery dissection -->CABG:  SVG to LAD (Dr. PVT)    Family History  Problem Relation Age of Onset  . Heart attack Sister     History  Substance Use Topics  . Smoking status: Never Smoker   . Smokeless tobacco: Not on file  . Alcohol Use: No    OB History    Grav Para Term Preterm Abortions TAB SAB Ect Mult Living                  Review of Systems  Constitutional: Negative for fever.  HENT: Negative for neck pain.   Respiratory: Negative for cough,  shortness of breath and wheezing.   Cardiovascular: Positive for chest pain. Negative for palpitations and leg swelling.  Gastrointestinal: Negative for nausea, vomiting and abdominal pain.  Skin: Negative for color change and pallor.  Neurological: Negative for dizziness, weakness, light-headedness, numbness and headaches.    Allergies  Review of patient's allergies indicates no known allergies.  Home Medications   No current outpatient prescriptions on file.  BP 113/74  Pulse 69  Temp(Src) 98.1 F (36.7 C) (Oral)  Resp 18  SpO2 99%  LMP 03/24/2011  Physical Exam  Nursing note and vitals reviewed. Constitutional: She is oriented to person, place, and time. She appears well-developed and well-nourished. No distress.  HENT:  Head: Normocephalic and atraumatic.  Mouth/Throat: Oropharynx is clear and moist.  Eyes: EOM are normal. Pupils are equal, round, and reactive to light.  Neck: Normal range of motion. Neck supple.  Cardiovascular: Normal rate, regular rhythm and normal heart sounds.   Pulmonary/Chest: Effort normal and breath sounds normal. No respiratory distress. She has no wheezes. She has no rales.  Abdominal: Soft. Bowel sounds are normal. She exhibits no distension and no mass. There is no tenderness. There is no rebound and no guarding.  Musculoskeletal: Normal range of motion. She exhibits no edema and no tenderness.  Neurological: She is alert and oriented to person, place, and time.  Skin: Skin is warm and dry. No rash noted. She is not diaphoretic. No erythema.  Psychiatric: She has a normal mood and affect. Her behavior is normal.    ED Course  Procedures (including critical care time)  Labs Reviewed  CBC - Abnormal; Notable for the following:    Hemoglobin 10.3 (*)    HCT 31.0 (*)    RDW 19.4 (*)    All other components within normal limits  COMPREHENSIVE METABOLIC PANEL - Abnormal; Notable for the following:    Glucose, Bld 101 (*)    Albumin 3.4 (*)     Total Bilirubin 0.1 (*)    GFR calc non Af Amer 62 (*)    GFR calc Af Amer 72 (*)    All other components within normal limits  CBC - Abnormal; Notable for the following:    RBC 3.75 (*)    Hemoglobin 9.5 (*)    HCT 29.2 (*)    MCV 77.9 (*)    MCH 25.3 (*)    RDW 19.5 (*)    All other components within normal limits  CREATININE, SERUM - Abnormal; Notable for the following:    GFR calc non Af Amer 66 (*)    GFR calc Af Amer 77 (*)    All other components within normal limits  TROPONIN I  DIFFERENTIAL  PROTIME-INR  APTT  CARDIAC PANEL(CRET KIN+CKTOT+MB+TROPI)  CARDIAC PANEL(CRET KIN+CKTOT+MB+TROPI)  CARDIAC PANEL(CRET KIN+CKTOT+MB+TROPI)  CARDIAC PANEL(CRET KIN+CKTOT+MB+TROPI)  CBC  BASIC METABOLIC PANEL   Dg Chest 2 View  03/31/2011  *RADIOLOGY REPORT*  Clinical Data: Chest pain.  Previous CABG.  CHEST - 2 VIEW  Comparison: 11/20/2010  Findings: There has been previous median sternotomy and CABG.  The heart is at the upper limits normal in size.  The vascularity is normal.  Lungs are clear.  No effusions.  No focal parenchymal pathology.  No significant bony finding.  IMPRESSION: Previous CABG.  No active disease.  Original Report Authenticated By: Thomasenia Sales, M.D.     1. Coronary artery dissection       MDM    Eval'd by Corinda Gubler Card and will admit.       Loren Racer, MD 03/31/11 1610  Loren Racer, MD 03/31/11 2156

## 2011-04-01 ENCOUNTER — Other Ambulatory Visit: Payer: Self-pay

## 2011-04-01 ENCOUNTER — Encounter (HOSPITAL_COMMUNITY): Payer: Self-pay

## 2011-04-01 ENCOUNTER — Encounter (HOSPITAL_COMMUNITY): Payer: Self-pay | Admitting: General Practice

## 2011-04-01 DIAGNOSIS — R079 Chest pain, unspecified: Secondary | ICD-10-CM

## 2011-04-01 LAB — BASIC METABOLIC PANEL
BUN: 10 mg/dL (ref 6–23)
Chloride: 104 mEq/L (ref 96–112)
Creatinine, Ser: 0.95 mg/dL (ref 0.50–1.10)
GFR calc Af Amer: 83 mL/min — ABNORMAL LOW (ref >90)
GFR calc non Af Amer: 71 mL/min — ABNORMAL LOW (ref >90)
Glucose, Bld: 100 mg/dL — ABNORMAL HIGH (ref 70–99)

## 2011-04-01 LAB — CBC
HCT: 29.5 % — ABNORMAL LOW (ref 36.0–46.0)
Hemoglobin: 9.6 g/dL — ABNORMAL LOW (ref 12.0–15.0)
MCHC: 32.5 g/dL (ref 30.0–36.0)
MCV: 78 fL (ref 78.0–100.0)
RDW: 19.5 % — ABNORMAL HIGH (ref 11.5–15.5)

## 2011-04-01 LAB — CARDIAC PANEL(CRET KIN+CKTOT+MB+TROPI)
CK, MB: 1.7 ng/mL (ref 0.3–4.0)
Relative Index: 1.5 (ref 0.0–2.5)
Total CK: 116 U/L (ref 7–177)
Total CK: 108 U/L (ref 7–177)

## 2011-04-01 MED ORDER — FERROUS SULFATE 325 (65 FE) MG PO TABS: 325.0000 mg | ORAL_TABLET | Freq: Every day | ORAL | Status: DC

## 2011-04-01 NOTE — Progress Notes (Signed)
Subjective:  No recurrent symptoms.  Cardiac enzymes negative.    Objective:  Vital Signs in the last 24 hours: Temp:  [97 F (36.1 C)-98.2 F (36.8 C)] 97.9 F (36.6 C) (02/05 0500) Pulse Rate:  [65-74] 66  (02/05 0500) Resp:  [16-20] 18  (02/05 0500) BP: (111-136)/(54-89) 111/70 mmHg (02/05 0500) SpO2:  [97 %-100 %] 97 % (02/05 0500) Weight:  [259 lb 1.6 oz (117.527 kg)] 259 lb 1.6 oz (117.527 kg) (02/05 0500)  Intake/Output from previous day: 02/04 0701 - 02/05 0700 In: 483 [P.O.:480; I.V.:3] Out: -    Physical Exam: General: Well developed, well nourished, in no acute distress. Head:  Normocephalic and atraumatic. Lungs: Clear to auscultation and percussion. Heart: Normal S1 and S2.  No murmur, rubs or gallops.  Pulses: Pulses normal in all 4 extremities. Extremities: No clubbing or cyanosis. No edema. Neurologic: Alert and oriented x 3.    Lab Results:  Basename 04/01/11 0550 03/31/11 1826  WBC 4.4 5.3  HGB 9.6* 9.5*  PLT 316 305    Basename 04/01/11 0550 03/31/11 1826 03/31/11 1419  NA 138 -- 136  K 3.7 -- 3.9  CL 104 -- 104  CO2 25 -- 24  GLUCOSE 100* -- 101*  BUN 10 -- 11  CREATININE 0.95 1.01 --    Basename 04/01/11 0600 04/01/11 0001  TROPONINI <0.30 <0.30   Hepatic Function Panel  Basename 03/31/11 1419  PROT 7.1  ALBUMIN 3.4*  AST 19  ALT 15  ALKPHOS 69  BILITOT 0.1*  BILIDIR --  IBILI --   No results found for this basename: CHOL in the last 72 hours No results found for this basename: PROTIME in the last 72 hours  Imaging: Dg Chest 2 View  03/31/2011  *RADIOLOGY REPORT*  Clinical Data: Chest pain.  Previous CABG.  CHEST - 2 VIEW  Comparison: 11/20/2010  Findings: There has been previous median sternotomy and CABG.  The heart is at the upper limits normal in size.  The vascularity is normal.  Lungs are clear.  No effusions.  No focal parenchymal pathology.  No significant bony finding.  IMPRESSION: Previous CABG.  No active disease.   Original Report Authenticated By: Thomasenia Sales, M.D.    EKG:  NSR.  No acute change.  First degree AVB.  Slightly prolonged QTc.    Cardiac Studies:  Enzymes neg times three  Assessment/Plan:  Chest Pain     Symptoms somewhat atypical, and cannot exclude ischemia.  However, symptoms are intermittent, she got scared, and with negative enzymes and no ST change favor conservative management and observation.  Fortunately, she is in rehab and has weekly times 3 follow up.    Anemia, iron def     Stools were negative.  She has heavy periods.  Set to see GYN soon.  SCAD    Will aim as OP to get studies to exclude FMD.  This is a common occurrence in the latest series of SCAD.  Sample cases reviewed with patient and husband last night.    Shawnie Pons, MD, [facility], [place] 9:45 AM 04/01/2011       Shawnie Pons, MD, [facility], [facility] 04/01/2011, 9:41 AM

## 2011-04-01 NOTE — Progress Notes (Signed)
Reviewed discharge instructions with patient, she stated her understanding.  IV discontinued, cath intact.  Patient has ambulated in hallways without any chest pain or pressure.  Awaiting husband to return for discharge home.  Colman Cater

## 2011-04-01 NOTE — Telephone Encounter (Signed)
Pt admitted to the hospital

## 2011-04-01 NOTE — Discharge Summary (Signed)
Discharge Summary   Patient ID: Ashlee Stein MRN: [id number], DOB/AGE: [birth year] 46 y.o.  Primary MD: Avon Gully, MD Primary Cardiologist: Shawnie Pons MD  Admit date: 03/31/2011 D/C date:     04/01/2011      Primary Discharge Diagnoses:  1. Chest pain, atypical   - negative cardiac enzymes x 3   - No EKG changes or acute findings on CXR  Secondary Discharge Diagnoses:  1. Spontaneous coronary artery dissection s/p CABG w/ SVG to LAD 09/2010 2. Hypertension 3. Hyperlipidemia 4. Chronic Iron deficiency anemia 5. Obesity  Allergies No Known Allergies  Diagnostic Studies/Procedures:  Dg Chest 2 View 03/31/2011   Findings: There has been previous median sternotomy and CABG.  The heart is at the upper limits normal in size.  The vascularity is normal.  Lungs are clear.  No effusions.  No focal parenchymal pathology.  No significant bony finding.  IMPRESSION: Previous CABG.  No active disease.    History of Present Illness: 46 y.o. female w/ PMHx significant for HTN, HLD, obesity, and coronary artery dissection (s/p CABG - SVG to LAD 09/2010) who presented to [facility] on 03/31/2011 with complaints of chest pain.   She was in the shower on the morning of presentation and felt sudden onset dull substernal chest pain that lasted about 10 min and self resolved. No associated symptoms. She continued to get ready and had her husband drive her to clinic for schedule lab work. In the car she had sudden onset right sided sharp chest pain that was similar to the pain that she had prior to her coronary artery dissection last august. It lasted about one minute, did not radiate, and wasn't associated with shortness of breath, nausea, or diaphoresis. While in clinic she reported her chest pain and was instructed to present to the ED for further evaluation.   Hospital Course: In the ED, EKG revealed NSR with no significant ST changes compared to previous EKG. CXR was without acute  cardiopulmonary abnormalities. Initial poc troponin negative and she was chest pain free. Given her cardiac history she was admitted for overnight observation.  Cardiac enzymes were cycled and remained negative and she remained chest pain free. She has chronic iron deficiency anemia with recent negative stool guaiacs and stable H&H during admission. It is recommended she continue taking iron supplements and follow up with her GYN soon.  She was seen and evaluated by Dr. Riley Kill who felt she was stable for discharge home with plans for follow up as scheduled below.  Discharge Vitals: Blood pressure 119/74, pulse 72, temperature 97.9 F (36.6 C), temperature source Oral, resp. rate 18, height 5' 7.5" (1.715 m), weight 259 lb 1.6 oz (117.527 kg), last menstrual period 03/24/2011, SpO2 97.00%.  Labs: Component Value Date   WBC 4.4 04/01/2011   HGB 9.6* 04/01/2011   HCT 29.5* 04/01/2011   MCV 78.0 04/01/2011   PLT 316 04/01/2011    Lab 04/01/11 0550 03/31/11 1419  NA 138 --  K 3.7 --  CL 104 --  CO2 25 --  BUN 10 --  CREATININE 0.95 --  CALCIUM 9.0 --  PROT -- 7.1  BILITOT -- 0.1*  ALKPHOS -- 69  ALT -- 15  AST -- 19  GLUCOSE 100* --   Basename 04/01/11 0600 04/01/11 0001 03/31/11 1825 03/31/11 1417  CKTOTAL 108 116 123 --  CKMB 1.6 1.7 1.8 --  TROPONINI <0.30 <0.30 <0.30 <0.30    Discharge Medications   Medication List  As of 04/01/2011 10:51 AM   TAKE these medications         amLODipine 5 MG tablet   Commonly known as: NORVASC   Take 5 mg by mouth daily.      aspirin EC 81 MG tablet   Take 1 tablet (81 mg total) by mouth daily.      famotidine 20 MG tablet   Commonly known as: PEPCID   Take 20 mg by mouth 2 (two) times daily as needed. Indigestion      ferrous sulfate 325 (65 FE) MG tablet   Take 1 tablet (325 mg total) by mouth daily with breakfast.      metoprolol tartrate 25 MG tablet   Commonly known as: LOPRESSOR   Take 75 mg by mouth 2 (two) times daily.       mulitivitamin with minerals Tabs   Take 1 tablet by mouth daily.      simvastatin 20 MG tablet   Commonly known as: ZOCOR   Take 20 mg by mouth at bedtime.            Disposition    Follow-up Information    Follow up with FANTA,TESFAYE, MD. Schedule an appointment as soon as possible for a visit in 2 weeks.      Follow up with Shawnie Pons, MD on 04/21/2011. (12:00)    Contact information:   [facility] [address] [place] [address] [phone]           Outstanding Labs/Studies: None  Duration of Discharge Encounter: Greater than 30 minutes including physician and PA time.  Signed, Aldean Suddeth PA-C 04/01/2011, 10:51 AM

## 2011-04-02 ENCOUNTER — Ambulatory Visit (HOSPITAL_COMMUNITY): Payer: BC Managed Care – PPO

## 2011-04-02 ENCOUNTER — Encounter (HOSPITAL_COMMUNITY): Payer: Self-pay

## 2011-04-03 ENCOUNTER — Encounter (HOSPITAL_COMMUNITY)
Admission: RE | Admit: 2011-04-03 | Discharge: 2011-04-03 | Disposition: A | Discharge status: Home / Self Care | Payer: Self-pay | Source: Ambulatory Visit | Attending: Cardiology | Admitting: Cardiology

## 2011-04-04 ENCOUNTER — Ambulatory Visit (HOSPITAL_COMMUNITY): Payer: BC Managed Care – PPO

## 2011-04-04 ENCOUNTER — Encounter (HOSPITAL_COMMUNITY)
Admission: RE | Admit: 2011-04-04 | Discharge: 2011-04-04 | Disposition: A | Discharge status: Home / Self Care | Payer: Self-pay | Source: Ambulatory Visit | Attending: Cardiology | Admitting: Cardiology

## 2011-04-07 ENCOUNTER — Ambulatory Visit (HOSPITAL_COMMUNITY): Payer: BC Managed Care – PPO

## 2011-04-07 ENCOUNTER — Ambulatory Visit (HOSPITAL_BASED_OUTPATIENT_CLINIC_OR_DEPARTMENT_OTHER): Payer: BC Managed Care – PPO

## 2011-04-08 ENCOUNTER — Encounter (HOSPITAL_COMMUNITY)
Admission: RE | Admit: 2011-04-08 | Discharge: 2011-04-08 | Disposition: A | Discharge status: Home / Self Care | Payer: Self-pay | Source: Ambulatory Visit | Attending: Cardiology | Admitting: Cardiology

## 2011-04-08 ENCOUNTER — Encounter (HOSPITAL_BASED_OUTPATIENT_CLINIC_OR_DEPARTMENT_OTHER): Payer: BC Managed Care – PPO

## 2011-04-08 ENCOUNTER — Ambulatory Visit (HOSPITAL_BASED_OUTPATIENT_CLINIC_OR_DEPARTMENT_OTHER): Payer: BC Managed Care – PPO

## 2011-04-09 ENCOUNTER — Ambulatory Visit (HOSPITAL_COMMUNITY): Payer: BC Managed Care – PPO

## 2011-04-09 ENCOUNTER — Encounter (HOSPITAL_COMMUNITY): Payer: Self-pay

## 2011-04-10 ENCOUNTER — Encounter (HOSPITAL_COMMUNITY)
Admission: RE | Admit: 2011-04-10 | Discharge: 2011-04-10 | Disposition: A | Discharge status: Home / Self Care | Payer: Self-pay | Source: Ambulatory Visit | Attending: Cardiology | Admitting: Cardiology

## 2011-04-11 ENCOUNTER — Ambulatory Visit (HOSPITAL_COMMUNITY): Payer: BC Managed Care – PPO

## 2011-04-11 ENCOUNTER — Encounter (HOSPITAL_COMMUNITY)
Admission: RE | Admit: 2011-04-11 | Discharge: 2011-04-11 | Disposition: A | Discharge status: Home / Self Care | Payer: Self-pay | Source: Ambulatory Visit | Attending: Cardiology | Admitting: Cardiology

## 2011-04-14 ENCOUNTER — Ambulatory Visit (HOSPITAL_COMMUNITY): Payer: BC Managed Care – PPO

## 2011-04-15 ENCOUNTER — Encounter (HOSPITAL_COMMUNITY)
Admission: RE | Admit: 2011-04-15 | Discharge: 2011-04-15 | Disposition: A | Discharge status: Home / Self Care | Payer: Self-pay | Source: Ambulatory Visit | Attending: Cardiology | Admitting: Cardiology

## 2011-04-16 ENCOUNTER — Encounter (HOSPITAL_COMMUNITY)
Admission: RE | Admit: 2011-04-16 | Discharge: 2011-04-16 | Disposition: A | Discharge status: Home / Self Care | Payer: Self-pay | Source: Ambulatory Visit | Attending: Cardiology | Admitting: Cardiology

## 2011-04-16 ENCOUNTER — Ambulatory Visit (HOSPITAL_COMMUNITY): Payer: BC Managed Care – PPO

## 2011-04-17 ENCOUNTER — Encounter (HOSPITAL_COMMUNITY)
Admission: RE | Admit: 2011-04-17 | Discharge: 2011-04-17 | Disposition: A | Discharge status: Home / Self Care | Payer: Self-pay | Source: Ambulatory Visit | Attending: Cardiology | Admitting: Cardiology

## 2011-04-18 ENCOUNTER — Ambulatory Visit (HOSPITAL_COMMUNITY): Payer: BC Managed Care – PPO

## 2011-04-21 ENCOUNTER — Ambulatory Visit (INDEPENDENT_AMBULATORY_CARE_PROVIDER_SITE_OTHER): Payer: BC Managed Care – PPO | Admitting: Cardiology

## 2011-04-21 ENCOUNTER — Encounter: Payer: Self-pay | Admitting: Cardiology

## 2011-04-21 ENCOUNTER — Ambulatory Visit (HOSPITAL_COMMUNITY): Payer: BC Managed Care – PPO

## 2011-04-21 VITALS — BP 122/78 | HR 62 | Ht 67.0 in | Wt 256.1 lb

## 2011-04-21 DIAGNOSIS — D649 Anemia, unspecified: Secondary | ICD-10-CM

## 2011-04-21 DIAGNOSIS — E785 Hyperlipidemia, unspecified: Secondary | ICD-10-CM

## 2011-04-21 DIAGNOSIS — R079 Chest pain, unspecified: Secondary | ICD-10-CM

## 2011-04-21 DIAGNOSIS — I2542 Coronary artery dissection: Secondary | ICD-10-CM

## 2011-04-21 NOTE — Patient Instructions (Signed)
Your physician recommends that you schedule a follow-up appointment in: 4-6 WEEKS  Your physician recommends that you continue on your current medications as directed. Please refer to the Current Medication list given to you today.  Please reschedule your sleep study.

## 2011-04-22 ENCOUNTER — Encounter (HOSPITAL_COMMUNITY)
Admission: RE | Admit: 2011-04-22 | Discharge: 2011-04-22 | Disposition: A | Discharge status: Home / Self Care | Payer: Self-pay | Source: Ambulatory Visit | Attending: Cardiology | Admitting: Cardiology

## 2011-04-23 ENCOUNTER — Encounter (HOSPITAL_COMMUNITY)
Admission: RE | Admit: 2011-04-23 | Discharge: 2011-04-23 | Disposition: A | Discharge status: Home / Self Care | Payer: Self-pay | Source: Ambulatory Visit | Attending: Cardiology | Admitting: Cardiology

## 2011-04-23 ENCOUNTER — Ambulatory Visit (HOSPITAL_COMMUNITY): Payer: BC Managed Care – PPO

## 2011-04-23 NOTE — Assessment & Plan Note (Signed)
Last LDL was 68.

## 2011-04-23 NOTE — Assessment & Plan Note (Signed)
The patient had SCAD.  She had normal postop LV function.  Her pain has atypical features and is not highly suggestive of ischemia.  Her ECG does not demonstrate resting changes.  Given her SCAD, we may eventually need to consider a restudy but I would like to hold off given the nature of her symptoms.  If she persists, it might be worthwhile to consider a pharmacologic radionuclide study with deferral of angio to an abnormal test. We will continue to follow her closely.

## 2011-04-23 NOTE — Progress Notes (Signed)
HPI:  Ms. Fiorenza has been in rehab for some time and she is in the graduate program and she thinks it really has been helpful.  She continues to have some chest discomfort.  It is not particularly related to exercise and she says that when she is in rehab it does not really bother her.  The discomfort is retrosternal, perhaps a little bit to the right of the sternum.   Sometimes it seems worse when she is driving.  She does have some shortness of breath with it.  There does not seem to be a lot of radiation of the discomfort, and no definite diaphoresis.  We again reviewed her course.    Current Outpatient Prescriptions  Medication Sig Dispense Refill  . amLODipine (NORVASC) 5 MG tablet Take 5 mg by mouth daily.      Marland Kitchen aspirin EC 81 MG tablet Take 1 tablet (81 mg total) by mouth daily.      . famotidine (PEPCID) 20 MG tablet Take 20 mg by mouth 2 (two) times daily as needed. Indigestion      . ferrous sulfate 325 (65 FE) MG tablet Take 1 tablet (325 mg total) by mouth daily with breakfast.  30 tablet  11  . metoprolol tartrate (LOPRESSOR) 25 MG tablet Take 75 mg by mouth 2 (two) times daily.      . Multiple Vitamin (MULITIVITAMIN WITH MINERALS) TABS Take 1 tablet by mouth daily.      . simvastatin (ZOCOR) 20 MG tablet Take 20 mg by mouth at bedtime.        No Known Allergies  Past Medical History  Diagnosis Date  . Hypertension   . Iron deficiency anemia   . Obesity   . Coronary artery dissection     NSTEMI 8/12: cath with mid to dist LAD spont. dissection, unable to do PCI and referred for CABG:  SVG to LAD (Dr. PVT)  . Dyslipidemia   . Coronary artery dissection 10/24/2010  . Angina   . Asthma     "as a child"    Past Surgical History  Procedure Date  . Coronary artery bypass graft 10/24/2010    CABG X1:  coronary artery dissection -->CABG:  SVG to LAD (Dr. PVT)    Family History  Problem Relation Age of Onset  . Heart attack Sister     History   Social History  .  Marital Status: Married    Spouse Name: N/A    Number of Children: N/A  . Years of Education: N/A   Occupational History  . Not on file.   Social History Main Topics  . Smoking status: Never Smoker   . Smokeless tobacco: Never Used  . Alcohol Use: No  . Drug Use: No  . Sexually Active: Not Currently   Other Topics Concern  . Not on file   Social History Narrative  . No narrative on file    ROS: Please see the HPI.  All other systems reviewed and negative.  PHYSICAL EXAM:  BP 122/78  Pulse 62  Ht 5\' 7"  (1.702 m)  Wt 256 lb 1.9 oz (116.175 kg)  BMI 40.11 kg/m2  LMP 03/24/2011  General: Well developed, well nourished, in no acute distress. Head:  Normocephalic and atraumatic. Neck: no JVD Lungs: Clear to auscultation and percussion. Heart: Normal S1 and S2.  No murmur, rubs or gallops.  Abdomen:  Normal bowel sounds; soft; non tender; no organomegaly Pulses: Pulses normal in all 4 extremities. Extremities:  No clubbing or cyanosis. No edema. Neurologic: Alert and oriented x 3.  EKG:  NSR.  LVH.  Low voltage in the anterior precordial leads.  Possible LAE.    ASSESSMENT AND PLAN:

## 2011-04-23 NOTE — Assessment & Plan Note (Signed)
She continues to remain anemic, and she is on iron.  She is also menstruating. She might benefit from seeing a hematolgist to consider treatment modalities.

## 2011-04-24 ENCOUNTER — Encounter (HOSPITAL_COMMUNITY)
Admission: RE | Admit: 2011-04-24 | Discharge: 2011-04-24 | Disposition: A | Discharge status: Home / Self Care | Payer: Self-pay | Source: Ambulatory Visit | Attending: Cardiology | Admitting: Cardiology

## 2011-04-25 ENCOUNTER — Ambulatory Visit (HOSPITAL_COMMUNITY): Payer: BC Managed Care – PPO

## 2011-04-29 ENCOUNTER — Encounter (HOSPITAL_COMMUNITY)
Admission: RE | Admit: 2011-04-29 | Discharge: 2011-04-29 | Disposition: A | Discharge status: Home / Self Care | Payer: Self-pay | Source: Ambulatory Visit | Attending: Cardiology | Admitting: Cardiology

## 2011-04-29 DIAGNOSIS — Z5189 Encounter for other specified aftercare: Secondary | ICD-10-CM | POA: Insufficient documentation

## 2011-04-29 DIAGNOSIS — E785 Hyperlipidemia, unspecified: Secondary | ICD-10-CM | POA: Insufficient documentation

## 2011-04-29 DIAGNOSIS — I252 Old myocardial infarction: Secondary | ICD-10-CM | POA: Insufficient documentation

## 2011-04-29 DIAGNOSIS — D649 Anemia, unspecified: Secondary | ICD-10-CM | POA: Insufficient documentation

## 2011-04-29 DIAGNOSIS — Z951 Presence of aortocoronary bypass graft: Secondary | ICD-10-CM | POA: Insufficient documentation

## 2011-04-29 DIAGNOSIS — I2542 Coronary artery dissection: Secondary | ICD-10-CM | POA: Insufficient documentation

## 2011-04-29 DIAGNOSIS — I10 Essential (primary) hypertension: Secondary | ICD-10-CM | POA: Insufficient documentation

## 2011-04-30 ENCOUNTER — Encounter (HOSPITAL_COMMUNITY)
Admission: RE | Admit: 2011-04-30 | Discharge: 2011-04-30 | Disposition: A | Discharge status: Home / Self Care | Payer: Self-pay | Source: Ambulatory Visit | Attending: Cardiology | Admitting: Cardiology

## 2011-05-01 ENCOUNTER — Encounter (HOSPITAL_COMMUNITY)
Admission: RE | Admit: 2011-05-01 | Discharge: 2011-05-01 | Disposition: A | Discharge status: Home / Self Care | Payer: Self-pay | Source: Ambulatory Visit | Attending: Cardiology | Admitting: Cardiology

## 2011-05-06 ENCOUNTER — Encounter (HOSPITAL_COMMUNITY)
Admission: RE | Admit: 2011-05-06 | Discharge: 2011-05-06 | Disposition: A | Discharge status: Home / Self Care | Payer: Self-pay | Source: Ambulatory Visit | Attending: Cardiology | Admitting: Cardiology

## 2011-05-07 ENCOUNTER — Encounter (HOSPITAL_COMMUNITY)
Admission: RE | Admit: 2011-05-07 | Discharge: 2011-05-07 | Disposition: A | Discharge status: Home / Self Care | Payer: Self-pay | Source: Ambulatory Visit | Attending: Cardiology | Admitting: Cardiology

## 2011-05-08 ENCOUNTER — Encounter (HOSPITAL_COMMUNITY)
Admission: RE | Admit: 2011-05-08 | Discharge: 2011-05-08 | Disposition: A | Discharge status: Home / Self Care | Payer: Self-pay | Source: Ambulatory Visit | Attending: Cardiology | Admitting: Cardiology

## 2011-05-13 ENCOUNTER — Encounter (HOSPITAL_COMMUNITY)
Admission: RE | Admit: 2011-05-13 | Discharge: 2011-05-13 | Disposition: A | Discharge status: Home / Self Care | Payer: Self-pay | Source: Ambulatory Visit | Attending: Cardiology | Admitting: Cardiology

## 2011-05-14 ENCOUNTER — Encounter (HOSPITAL_COMMUNITY)
Admission: RE | Admit: 2011-05-14 | Discharge: 2011-05-14 | Disposition: A | Discharge status: Home / Self Care | Payer: Self-pay | Source: Ambulatory Visit | Attending: Cardiology | Admitting: Cardiology

## 2011-05-15 ENCOUNTER — Encounter (HOSPITAL_COMMUNITY)
Admission: RE | Admit: 2011-05-15 | Discharge: 2011-05-15 | Disposition: A | Discharge status: Home / Self Care | Payer: Self-pay | Source: Ambulatory Visit | Attending: Cardiology | Admitting: Cardiology

## 2011-05-20 ENCOUNTER — Encounter (HOSPITAL_COMMUNITY)
Admission: RE | Admit: 2011-05-20 | Discharge: 2011-05-20 | Disposition: A | Discharge status: Home / Self Care | Payer: Self-pay | Source: Ambulatory Visit | Attending: Cardiology | Admitting: Cardiology

## 2011-05-20 DIAGNOSIS — Z0271 Encounter for disability determination: Secondary | ICD-10-CM

## 2011-05-21 ENCOUNTER — Encounter (HOSPITAL_COMMUNITY)
Admission: RE | Admit: 2011-05-21 | Discharge: 2011-05-21 | Disposition: A | Discharge status: Home / Self Care | Payer: Self-pay | Source: Ambulatory Visit | Attending: Cardiology | Admitting: Cardiology

## 2011-05-22 ENCOUNTER — Ambulatory Visit: Payer: BC Managed Care – PPO | Admitting: Cardiology

## 2011-05-22 ENCOUNTER — Encounter (HOSPITAL_COMMUNITY)
Admission: RE | Admit: 2011-05-22 | Discharge: 2011-05-22 | Disposition: A | Discharge status: Home / Self Care | Payer: Self-pay | Source: Ambulatory Visit | Attending: Cardiology | Admitting: Cardiology

## 2011-05-27 ENCOUNTER — Encounter (HOSPITAL_COMMUNITY): Payer: Self-pay

## 2011-05-27 ENCOUNTER — Telehealth: Payer: Self-pay | Admitting: Cardiology

## 2011-05-27 DIAGNOSIS — Z5189 Encounter for other specified aftercare: Secondary | ICD-10-CM | POA: Insufficient documentation

## 2011-05-27 DIAGNOSIS — I2542 Coronary artery dissection: Secondary | ICD-10-CM | POA: Insufficient documentation

## 2011-05-27 DIAGNOSIS — I252 Old myocardial infarction: Secondary | ICD-10-CM | POA: Insufficient documentation

## 2011-05-27 DIAGNOSIS — D649 Anemia, unspecified: Secondary | ICD-10-CM | POA: Insufficient documentation

## 2011-05-27 DIAGNOSIS — E785 Hyperlipidemia, unspecified: Secondary | ICD-10-CM | POA: Insufficient documentation

## 2011-05-27 DIAGNOSIS — I10 Essential (primary) hypertension: Secondary | ICD-10-CM | POA: Insufficient documentation

## 2011-05-27 DIAGNOSIS — Z951 Presence of aortocoronary bypass graft: Secondary | ICD-10-CM | POA: Insufficient documentation

## 2011-05-27 NOTE — Telephone Encounter (Signed)
New Msg: Pt calling wanting to speak with nurse/MD regarding pt having some swelling in legs and some tightness in arms and fingers. Pt also c/o pain on the R side of body. Pt would like to speak with nurse regarding these negative symptoms.   Please return pt call to discuss further.

## 2011-05-27 NOTE — Telephone Encounter (Signed)
Left message on pt's voice-mail to call back

## 2011-05-27 NOTE — Telephone Encounter (Signed)
Paper Received from [facility], only 1 Question needs to be answered on this I will forward to Lauren/Stuckey for completion 05/27/11/KM

## 2011-05-28 ENCOUNTER — Encounter (HOSPITAL_COMMUNITY): Payer: Self-pay

## 2011-05-28 NOTE — Telephone Encounter (Signed)
Left message to call back.  Pt has a scheduled appointment with Dr Riley Kill on 05/29/11.

## 2011-05-29 ENCOUNTER — Telehealth: Payer: Self-pay | Admitting: Cardiology

## 2011-05-29 ENCOUNTER — Encounter (HOSPITAL_COMMUNITY): Payer: Self-pay

## 2011-05-29 ENCOUNTER — Encounter: Payer: Self-pay | Admitting: Cardiology

## 2011-05-29 ENCOUNTER — Ambulatory Visit (INDEPENDENT_AMBULATORY_CARE_PROVIDER_SITE_OTHER): Payer: BC Managed Care – PPO | Admitting: Cardiology

## 2011-05-29 VITALS — BP 122/88 | HR 60 | Ht 67.5 in | Wt 263.8 lb

## 2011-05-29 DIAGNOSIS — I2510 Atherosclerotic heart disease of native coronary artery without angina pectoris: Secondary | ICD-10-CM

## 2011-05-29 DIAGNOSIS — D649 Anemia, unspecified: Secondary | ICD-10-CM

## 2011-05-29 DIAGNOSIS — I10 Essential (primary) hypertension: Secondary | ICD-10-CM

## 2011-05-29 DIAGNOSIS — I2542 Coronary artery dissection: Secondary | ICD-10-CM

## 2011-05-29 DIAGNOSIS — E785 Hyperlipidemia, unspecified: Secondary | ICD-10-CM

## 2011-05-29 DIAGNOSIS — E7800 Pure hypercholesterolemia, unspecified: Secondary | ICD-10-CM

## 2011-05-29 NOTE — Patient Instructions (Addendum)
Your physician wants you to follow-up in: 3 MONTHS with Dr Riley Kill.  You will receive a reminder letter in the mail two months in advance. If you don't receive a letter, please call our office to schedule the follow-up appointment.  Your physician recommends that you continue on your current medications as directed. Please refer to the Current Medication list given to you today.  The patient can return to work on Jun 30, 2011.

## 2011-05-29 NOTE — Telephone Encounter (Signed)
Dr Riley Kill completed paperwork from [facility]. Awaiting office notes from Dr Riley Kill before faxing.

## 2011-05-29 NOTE — Progress Notes (Signed)
HPI:  She is in for follow up.  In general, she is doing pretty well, now being in the rehab program for some time now.  She gets out in the neighborhood, park and is able to walk.  She has a history of heavy periods and is having more trouble now.  Her heavy periods came to a stop--followed by a brief period, then a darker period.  She is scheduled to see Dr. Emelda Fear in GYN.  She also has had chronic anemia requiring iron to keep up, which we have stressed.  Of note, the patient works in a [employer], and the work can be fairly stressful, and at times involve some more than average lifting.    Current Outpatient Prescriptions  Medication Sig Dispense Refill  . amLODipine (NORVASC) 5 MG tablet Take 5 mg by mouth daily.      Marland Kitchen aspirin EC 81 MG tablet Take 1 tablet (81 mg total) by mouth daily.      . famotidine (PEPCID) 20 MG tablet Take 20 mg by mouth 2 (two) times daily as needed. Indigestion      . ferrous sulfate 325 (65 FE) MG tablet Take 1 tablet (325 mg total) by mouth daily with breakfast.  30 tablet  11  . metoprolol tartrate (LOPRESSOR) 25 MG tablet Take 75 mg by mouth 2 (two) times daily.      . Multiple Vitamin (MULITIVITAMIN WITH MINERALS) TABS Take 1 tablet by mouth daily.      . simvastatin (ZOCOR) 20 MG tablet Take 20 mg by mouth at bedtime.      Marland Kitchen DISCONTD: famotidine (PEPCID) 20 MG tablet Take 1 tablet (20 mg total) by mouth 2 (two) times daily.  60 tablet  11    No Known Allergies  Past Medical History  Diagnosis Date  . Hypertension   . Iron deficiency anemia   . Obesity   . Coronary artery dissection     NSTEMI 8/12: cath with mid to dist LAD spont. dissection, unable to do PCI and referred for CABG:  SVG to LAD (Dr. PVT)  . Dyslipidemia   . Coronary artery dissection 10/24/2010  . Angina   . Asthma     "as a child"    Past Surgical History  Procedure Date  . Coronary artery bypass graft 10/24/2010    CABG X1:  coronary artery dissection -->CABG:  SVG to  LAD (Dr. PVT)    Family History  Problem Relation Age of Onset  . Heart attack Sister     History   Social History  . Marital Status: Married    Spouse Name: N/A    Number of Children: N/A  . Years of Education: N/A   Occupational History  . Not on file.   Social History Main Topics  . Smoking status: Never Smoker   . Smokeless tobacco: Never Used  . Alcohol Use: No  . Drug Use: No  . Sexually Active: Not Currently   Other Topics Concern  . Not on file   Social History Narrative  . No narrative on file    ROS: Please see the HPI.  All other systems reviewed and negative.  PHYSICAL EXAM:  BP 122/88  Pulse 60  Ht 5' 7.5" (1.715 m)  Wt 263 lb 12.8 oz (119.659 kg)  BMI 40.71 kg/m2  General: Well developed, well nourished, in no acute distress. Head:  Normocephalic and atraumatic. Neck: no JVD Lungs: Clear to auscultation and percussion. Median sternotomy well  healed.   Heart: Normal S1 and S2.  No murmur, rubs or gallops.  Pulses: Pulses normal in all 4 extremities. Extremities: No clubbing or cyanosis. No edema. Neurologic: Alert and oriented x 3.  EKG:  NSR.  Delay in R wave progression which is likely related to lead position.    ASSESSMENT AND PLAN:

## 2011-05-29 NOTE — Telephone Encounter (Signed)
Pt in the office to see Dr Riley Kill today for scheduled appointment.

## 2011-05-29 NOTE — Telephone Encounter (Signed)
The pt called back and would like to return to work on Jun 30, 2011. The pt would like Dr Riley Kill to specify day shift and 8 hours per day.

## 2011-05-29 NOTE — Telephone Encounter (Signed)
New problem:  Patient calling to speak with nurse, no information was given.

## 2011-06-03 ENCOUNTER — Encounter (HOSPITAL_COMMUNITY): Payer: Self-pay

## 2011-06-04 ENCOUNTER — Encounter (HOSPITAL_COMMUNITY): Payer: Self-pay

## 2011-06-05 ENCOUNTER — Encounter (HOSPITAL_COMMUNITY): Payer: Self-pay

## 2011-06-05 ENCOUNTER — Other Ambulatory Visit (HOSPITAL_COMMUNITY)
Admission: RE | Admit: 2011-06-05 | Discharge: 2011-06-05 | Disposition: A | Discharge status: Home / Self Care | Payer: BC Managed Care – PPO | Source: Ambulatory Visit | Attending: Obstetrics and Gynecology | Admitting: Obstetrics and Gynecology

## 2011-06-05 ENCOUNTER — Other Ambulatory Visit: Payer: Self-pay | Admitting: Obstetrics and Gynecology

## 2011-06-05 DIAGNOSIS — Z01419 Encounter for gynecological examination (general) (routine) without abnormal findings: Secondary | ICD-10-CM | POA: Insufficient documentation

## 2011-06-10 ENCOUNTER — Encounter (HOSPITAL_COMMUNITY): Payer: Self-pay

## 2011-06-10 ENCOUNTER — Telehealth: Payer: Self-pay | Admitting: Cardiology

## 2011-06-10 NOTE — Telephone Encounter (Signed)
[employer] calling re pt's being released to rrtn to work, pls call [phone] steve

## 2011-06-10 NOTE — Telephone Encounter (Signed)
Pt would like a call because she has questions regarding her medication

## 2011-06-10 NOTE — Telephone Encounter (Signed)
Pt's husband called to ask questions about the pt taking Amlodipine and Simvastatin.  I made him aware that pt is on the highest dose of Simvastatin allowed with amlodipine.  I reviewed the pt's recent liver and lipid results with him.  He also had questions about the pt's return to work date and what restrictions Dr Riley Kill had written. I reviewed this information also with him.  I called Brett Canales at [facility] and made him aware of the documentation Dr Riley Kill wrote on fax about return to work. Document also faxed to [employer]. Pt may return to work May 6; she needs to work day shift and no more than 8 hours/day due to recent heart surgery.

## 2011-06-11 ENCOUNTER — Encounter (HOSPITAL_COMMUNITY): Payer: Self-pay

## 2011-06-12 ENCOUNTER — Encounter (HOSPITAL_COMMUNITY): Payer: Self-pay

## 2011-06-12 ENCOUNTER — Other Ambulatory Visit: Payer: Self-pay | Admitting: Obstetrics and Gynecology

## 2011-06-12 ENCOUNTER — Telehealth: Payer: Self-pay | Admitting: Cardiology

## 2011-06-12 NOTE — Telephone Encounter (Signed)
N/A.  LMTC. 

## 2011-06-12 NOTE — Telephone Encounter (Signed)
Pt is having a D& C on 07/01/11. She would like her out of work note extended from May 6 to cover this new time period. I told pt she probably would need Dr. Emelda Fear to cover that time period after the [facility] But would forward this to Lauren for her review and return call to pt on Friday. Mylo Red RN

## 2011-06-12 NOTE — Telephone Encounter (Signed)
New msg Pt wanted to discuss procedure for gyn she is to have on May 7. She wants to extend her time to go back to work.

## 2011-06-13 NOTE — Telephone Encounter (Signed)
I spoke with the Ashlee Stein and made her aware that Dr Emelda Fear will need to complete documentation about D & C and any restrictions to extend her disability. The Ashlee Stein agreed with plan.

## 2011-06-13 NOTE — Telephone Encounter (Signed)
F/U  Patient request return call back from Nurse LB regarding D& C and cardiac circumstances. Patient can be reached at  [phone].

## 2011-06-13 NOTE — Telephone Encounter (Signed)
Left message for pt to call back. Per Dr Riley Kill the pt needs to have Dr Emelda Fear extend her time out of work due to D & C.

## 2011-06-17 ENCOUNTER — Encounter (HOSPITAL_COMMUNITY)
Admission: RE | Admit: 2011-06-17 | Discharge: 2011-06-17 | Disposition: A | Discharge status: Home / Self Care | Payer: Self-pay | Source: Ambulatory Visit | Attending: Cardiology | Admitting: Cardiology

## 2011-06-17 NOTE — Assessment & Plan Note (Signed)
Has SCAD  (Spontaneous coronary artery dissection)---urgent CABG after unable to cross with wire.  Has gone through rehab and would be ready to return to work in the near future barring other issues----such as GYN surgery.

## 2011-06-17 NOTE — Assessment & Plan Note (Signed)
Not at [employer].  May consider switching to pravastatin 40mg  at next visit.  Fortunately, simva will not need to be raised.

## 2011-06-17 NOTE — Assessment & Plan Note (Signed)
History of heavy periods with sketchy iron replacement----now seeing GYN for evaluation.  Emelda Fear)

## 2011-06-17 NOTE — Assessment & Plan Note (Signed)
Controlled.  Would benefit from weight loss.

## 2011-06-18 ENCOUNTER — Encounter (HOSPITAL_COMMUNITY): Payer: Self-pay

## 2011-06-19 ENCOUNTER — Encounter (HOSPITAL_COMMUNITY): Payer: Self-pay

## 2011-06-19 ENCOUNTER — Telehealth: Payer: Self-pay | Admitting: Cardiology

## 2011-06-19 NOTE — Telephone Encounter (Signed)
I spoke with the pt's husband and he was wondering if Dr Riley Kill had completed letter about pt returning to work.  I made him aware that Dr Riley Kill has not completed letter at this time.  I will have Dr Riley Kill complete note today. The pt would like to pick-up a copy of letter when ready.

## 2011-06-19 NOTE — Telephone Encounter (Signed)
Pt requesting a call form lauren

## 2011-06-20 ENCOUNTER — Encounter (HOSPITAL_COMMUNITY): Payer: Self-pay | Admitting: Pharmacy Technician

## 2011-06-20 ENCOUNTER — Other Ambulatory Visit: Payer: Self-pay | Admitting: Obstetrics and Gynecology

## 2011-06-20 ENCOUNTER — Encounter (HOSPITAL_COMMUNITY)
Admission: RE | Admit: 2011-06-20 | Discharge: 2011-06-20 | Disposition: A | Discharge status: Home / Self Care | Payer: BC Managed Care – PPO | Source: Ambulatory Visit | Attending: Obstetrics and Gynecology | Admitting: Obstetrics and Gynecology

## 2011-06-20 ENCOUNTER — Encounter (HOSPITAL_COMMUNITY): Payer: Self-pay

## 2011-06-20 LAB — URINALYSIS, ROUTINE W REFLEX MICROSCOPIC
Nitrite: NEGATIVE
Specific Gravity, Urine: 1.025 (ref 1.005–1.030)
Urobilinogen, UA: 0.2 mg/dL (ref 0.0–1.0)

## 2011-06-20 LAB — URINE MICROSCOPIC-ADD ON

## 2011-06-20 LAB — BASIC METABOLIC PANEL
BUN: 12 mg/dL (ref 6–23)
Chloride: 104 mEq/L (ref 96–112)
GFR calc Af Amer: 77 mL/min — ABNORMAL LOW (ref >90)
Potassium: 4 mEq/L (ref 3.5–5.1)

## 2011-06-20 LAB — CBC
HCT: 33.7 % — ABNORMAL LOW (ref 36.0–46.0)
Hemoglobin: 11.2 g/dL — ABNORMAL LOW (ref 12.0–15.0)
WBC: 4.9 10*3/uL (ref 4.0–10.5)

## 2011-06-20 LAB — SURGICAL PCR SCREEN
MRSA, PCR: NEGATIVE
Staphylococcus aureus: NEGATIVE

## 2011-06-20 NOTE — Telephone Encounter (Signed)
Letter completed and faxed to [employer].  I spoke with the pt's husband and made him aware that letter has been placed at the front desk for pick-up.

## 2011-06-20 NOTE — Patient Instructions (Signed)
20 Ashlee Stein  06/20/2011   Your procedure is scheduled on:  Tuesday, 07/01/11  Report to [facility] at 0930 AM.  Call this number if you have problems the morning of surgery: [phone]   Remember:   Do not eat food:After Midnight.  May have clear liquids:until Midnight .  Clear liquids include soda, tea, black coffee, apple or grape juice, broth.  Take these medicines the morning of surgery with A SIP OF WATER: norvasc, pepcid, and lopressor   Do not wear jewelry, make-up or nail polish.  Do not wear lotions, powders, or perfumes. You may wear deodorant.  Do not shave 48 hours prior to surgery.  Do not bring valuables to the hospital.  Contacts, dentures or bridgework may not be worn into surgery.  Leave suitcase in the car. After surgery it may be brought to your room.  For patients admitted to the hospital, checkout time is 11:00 AM the day of discharge.   Patients discharged the day of surgery will not be allowed to drive home.  Name and phone number of your driver: driver  Special Instructions: CHG Shower Use Special Wash: 1/2 bottle night before surgery and 1/2 bottle morning of surgery.   Please read over the following fact sheets that you were given: Pain Booklet, MRSA Information, Surgical Site Infection Prevention, Anesthesia Post-op Instructions and Care and Recovery After Surgery   PATIENT INSTRUCTIONS POST-ANESTHESIA  IMMEDIATELY FOLLOWING SURGERY:  Do not drive or operate machinery for the first twenty four hours after surgery.  Do not make any important decisions for twenty four hours after surgery or while taking narcotic pain medications or sedatives.  If you develop intractable nausea and vomiting or a severe headache please notify your doctor immediately.  FOLLOW-UP:  Please make an appointment with your surgeon as instructed. You do not need to follow up with anesthesia unless specifically instructed to do so.  WOUND CARE INSTRUCTIONS (if applicable):   Keep a dry clean dressing on the anesthesia/puncture wound site if there is drainage.  Once the wound has quit draining you may leave it open to air.  Generally you should leave the bandage intact for twenty four hours unless there is drainage.  If the epidural site drains for more than 36-48 hours please call the anesthesia department.  QUESTIONS?:  Please feel free to call your physician or the hospital operator if you have any questions, and they will be happy to assist you.          Dilation and Curettage or Vacuum Curettage Care After Refer to this sheet in the next few weeks. These instructions provide you with general information on caring for yourself after your procedure. Your caregiver may also give you more specific instructions. Your treatment has been planned according to current medical practices, but problems sometimes occur. Call your caregiver if you have any problems or questions after your procedure. HOME CARE INSTRUCTIONS   Do not drive for 24 hours.   Wait 1 week before returning to strenuous activities.   Take your temperature 2 times a day for 4 days and write it down. Provide these temperatures to your caregiver if you develop a fever.   Avoid long periods of standing, and do no heavy lifting (more than 10 pounds or 4.5 kg), pushing, or pulling.   Limit stair climbing to once or twice a day.   Take rest periods often.   You may resume your usual diet.   Drink enough fluids to keep  your urine clear or pale yellow.   You should return to your usual bowel function. If constipation should occur, you may:   Take a mild laxative with permission from your caregiver.   Add fruit and bran to your diet.   Drink more fluids.   Take showers instead of baths until your caregiver gives you permission to take baths.   Do not go swimming or use a hot tub until your caregiver gives you permission.   Try to have someone with you or available to you the first 24 to 48  hours, especially if you had a general anesthetic.   Do not douche, use tampons, or have intercourse until after your follow-up appointment, or when your caregiver approves.   Only take over-the-counter or prescription medicines for pain, discomfort, or fever as directed by your caregiver. Do not take aspirin. It can cause bleeding.   If a prescription was given, follow your caregiver's directions.   Keep all your follow-up appointments recommended by your caregiver.  SEEK MEDICAL CARE IF:   You have increasing cramps or pain not relieved with medicine.   You have abdominal pain which does not seem to be related to the same area of earlier cramping and pain.   You have bad smelling vaginal discharge.   You have a rash.   You have problems with any medicine.  SEEK IMMEDIATE MEDICAL CARE IF:   You have bleeding that is heavier than a normal menstrual period.   You have a fever.   You have chest pain.   You have shortness of breath.   You feel dizzy or feel like fainting.   You pass out.   You have pain in your shoulder strap area.   You have heavy vaginal bleeding with or without blood clots.  MAKE SURE YOU:   Understand these instructions.   Will watch your condition.   Will get help right away if you are not doing well or get worse.  Document Released: 02/08/2000 Document Revised: 01/30/2011 Document Reviewed: 09/07/2008 [facility] Patient Information 2012 [place], [place].

## 2011-06-24 ENCOUNTER — Encounter (HOSPITAL_COMMUNITY): Payer: Self-pay

## 2011-06-25 ENCOUNTER — Encounter (HOSPITAL_COMMUNITY)
Admission: RE | Admit: 2011-06-25 | Discharge: 2011-06-25 | Disposition: A | Discharge status: Home / Self Care | Payer: Self-pay | Source: Ambulatory Visit | Attending: Cardiology | Admitting: Cardiology

## 2011-06-25 DIAGNOSIS — Z5189 Encounter for other specified aftercare: Secondary | ICD-10-CM | POA: Insufficient documentation

## 2011-06-25 DIAGNOSIS — I252 Old myocardial infarction: Secondary | ICD-10-CM | POA: Insufficient documentation

## 2011-06-25 DIAGNOSIS — Z951 Presence of aortocoronary bypass graft: Secondary | ICD-10-CM | POA: Insufficient documentation

## 2011-06-25 DIAGNOSIS — I2542 Coronary artery dissection: Secondary | ICD-10-CM | POA: Insufficient documentation

## 2011-06-25 DIAGNOSIS — D649 Anemia, unspecified: Secondary | ICD-10-CM | POA: Insufficient documentation

## 2011-06-25 DIAGNOSIS — I10 Essential (primary) hypertension: Secondary | ICD-10-CM | POA: Insufficient documentation

## 2011-06-25 DIAGNOSIS — E785 Hyperlipidemia, unspecified: Secondary | ICD-10-CM | POA: Insufficient documentation

## 2011-06-26 ENCOUNTER — Encounter (HOSPITAL_COMMUNITY)
Admission: RE | Admit: 2011-06-26 | Discharge: 2011-06-26 | Disposition: A | Discharge status: Home / Self Care | Payer: Self-pay | Source: Ambulatory Visit | Attending: Cardiology | Admitting: Cardiology

## 2011-06-29 DIAGNOSIS — N920 Excessive and frequent menstruation with regular cycle: Secondary | ICD-10-CM | POA: Diagnosis present

## 2011-06-29 NOTE — H&P (Signed)
Ashlee Stein is an 46 y.o. female. She is admitted for hysteroscopy D&C endometrial ablation due to menstrual periods that have become progressively heavier now lasting 5-7 days. Evaluation has included a pelvic ultrasound which shows uterine enlargement to 10-12 week size and with uterus sounding to 10 cm with ultrasound showing also 2 small polyps inside the endometrial cavity. Endometrial biopsy is benign. She is not having complaints of pelvic pressure or pain so the endometrial ablation with polypectomy is desired and preference for hysterectomy. Other alternatives such as placement of IUD her considered less desirable given the possibility of polyps.  Additionally, the patient has decided to proceed with tubal sterilization. Falope ring application or cautery will be used 30 on clinical findings at time of laparoscopy. Permanency of the sterilization is understood by patient. Patient has discussed this with her husband and reach this agreement after discussions by phone 06/30/2011.  Pertinent Gynecological History: Menses: flow is excessive with use of Several pads or tampons on heaviest days Bleeding: Regular lasting 7 days Contraception: condoms DES exposure: unknown Blood transfusions: none Sexually transmitted diseases: no past history Previous GYN Procedures: None  Last mammogram: normal Date:  Last pap: normal Date: 06/06/2011 OB History: G2, P2   Menstrual History: Menarche age: 74 No LMP recorded. 05/29/2011    Past Medical History  Diagnosis Date  . Hypertension   . Iron deficiency anemia   . Obesity   . Coronary artery dissection     NSTEMI 8/12: cath with mid to dist LAD spont. dissection, unable to do PCI and referred for CABG:  SVG to LAD (Dr. PVT)  . Dyslipidemia   . Coronary artery dissection 10/24/2010  . Angina   . Asthma     "as a child"    Past Surgical History  Procedure Date  . Coronary artery bypass graft 10/24/2010    CABG X1:  coronary artery  dissection -->CABG:  SVG to LAD (Dr. PVT)    Family History  Problem Relation Age of Onset  . Heart attack Sister     Social History:  reports that she has never smoked. She has never used smokeless tobacco. She reports that she does not drink alcohol or use illicit drugs.  Allergies:  Allergies  Allergen Reactions  . Morphine And Related Rash  . Penicillins Rash    No prescriptions prior to admission    ROS  There were no vitals taken for this visit. weight 264 blood pressure 130/88 Physical Exam  Constitutional: She is oriented to person, place, and time. She appears well-developed and well-nourished.  HENT:  Head: Normocephalic and atraumatic.  Eyes: Pupils are equal, round, and reactive to light.  Cardiovascular: Normal rate and regular rhythm.   Respiratory: Breath sounds normal.        Thoracotomy scar status post CABG  GI: Soft. Bowel sounds are normal.  Genitourinary: Vagina normal.       Uterus 10-12 week size on bimanual exam sounding to 10 cm at time of endometrial biopsy. Biopsy returned benign endometrial polyp and adjacent benign proliferative endometrium. Ultrasound reveals the uterus to be 12.1 cm length 8.8 x 6.9 cm with multiple fibroids primarily in the posterior wall or separate fibroids are described at 5.7 cm in diameter endometrium has 2 small 1.2 cm areas that may represent endometrial polyps  Musculoskeletal: Normal range of motion.  Neurological: She is alert and oriented to person, place, and time.  Skin: Skin is warm and dry.  Psychiatric: She has a normal  mood and affect. Her behavior is normal. Thought content normal.    No results found for this or any previous visit (from the past 24 hour(s)).  No results found.  Assessment/Plan:  menorrhagia secondary to endometrial polyp, benign and uterine fibroids Plan: Hysteroscopy D&C endometrial ablation after endometrial polypectomy scheduled for 07/01/2011  Ashlee Stein V 06/29/2011, 8:42  PM

## 2011-07-01 ENCOUNTER — Encounter (HOSPITAL_COMMUNITY)
Admission: RE | Disposition: A | Discharge status: Home / Self Care | Payer: Self-pay | Source: Ambulatory Visit | Attending: Obstetrics and Gynecology

## 2011-07-01 ENCOUNTER — Other Ambulatory Visit: Payer: Self-pay | Admitting: Obstetrics and Gynecology

## 2011-07-01 ENCOUNTER — Ambulatory Visit (HOSPITAL_COMMUNITY): Payer: BC Managed Care – PPO | Admitting: Anesthesiology

## 2011-07-01 ENCOUNTER — Encounter (HOSPITAL_COMMUNITY): Payer: Self-pay | Admitting: *Deleted

## 2011-07-01 ENCOUNTER — Encounter (HOSPITAL_COMMUNITY): Payer: Self-pay | Admitting: Anesthesiology

## 2011-07-01 ENCOUNTER — Ambulatory Visit (HOSPITAL_COMMUNITY)
Admission: RE | Admit: 2011-07-01 | Discharge: 2011-07-01 | Disposition: A | Discharge status: Home / Self Care | Payer: BC Managed Care – PPO | Source: Ambulatory Visit | Attending: Obstetrics and Gynecology | Admitting: Obstetrics and Gynecology

## 2011-07-01 DIAGNOSIS — D259 Leiomyoma of uterus, unspecified: Secondary | ICD-10-CM | POA: Insufficient documentation

## 2011-07-01 DIAGNOSIS — Z951 Presence of aortocoronary bypass graft: Secondary | ICD-10-CM | POA: Insufficient documentation

## 2011-07-01 DIAGNOSIS — Z01812 Encounter for preprocedural laboratory examination: Secondary | ICD-10-CM | POA: Insufficient documentation

## 2011-07-01 DIAGNOSIS — J45909 Unspecified asthma, uncomplicated: Secondary | ICD-10-CM | POA: Insufficient documentation

## 2011-07-01 DIAGNOSIS — N840 Polyp of corpus uteri: Secondary | ICD-10-CM | POA: Insufficient documentation

## 2011-07-01 DIAGNOSIS — Z302 Encounter for sterilization: Secondary | ICD-10-CM | POA: Insufficient documentation

## 2011-07-01 DIAGNOSIS — I2510 Atherosclerotic heart disease of native coronary artery without angina pectoris: Secondary | ICD-10-CM | POA: Insufficient documentation

## 2011-07-01 DIAGNOSIS — K429 Umbilical hernia without obstruction or gangrene: Secondary | ICD-10-CM | POA: Insufficient documentation

## 2011-07-01 DIAGNOSIS — I10 Essential (primary) hypertension: Secondary | ICD-10-CM | POA: Insufficient documentation

## 2011-07-01 DIAGNOSIS — K219 Gastro-esophageal reflux disease without esophagitis: Secondary | ICD-10-CM | POA: Insufficient documentation

## 2011-07-01 DIAGNOSIS — N920 Excessive and frequent menstruation with regular cycle: Secondary | ICD-10-CM | POA: Insufficient documentation

## 2011-07-01 HISTORY — PX: UMBILICAL HERNIA REPAIR: SHX196

## 2011-07-01 HISTORY — PX: CERVICAL POLYPECTOMY: SHX88

## 2011-07-01 HISTORY — PX: LAPAROSCOPIC TUBAL LIGATION: SHX1937

## 2011-07-01 SURGERY — LIGATION, FALLOPIAN TUBE, LAPAROSCOPIC
Anesthesia: General | Site: Abdomen | Wound class: Clean

## 2011-07-01 MED ORDER — ONDANSETRON HCL 4 MG/2ML IJ SOLN
INTRAMUSCULAR | Status: AC
  Administered 2011-07-01: 4 mg via INTRAVENOUS
  Filled 2011-07-01: qty 2

## 2011-07-01 MED ORDER — METOPROLOL TARTRATE 1 MG/ML IV SOLN
INTRAVENOUS | Status: AC
  Filled 2011-07-01: qty 5

## 2011-07-01 MED ORDER — BUPIVACAINE-EPINEPHRINE PF 0.5-1:200000 % IJ SOLN
INTRAMUSCULAR | Status: AC
  Filled 2011-07-01: qty 10

## 2011-07-01 MED ORDER — IPRATROPIUM-ALBUTEROL 18-103 MCG/ACT IN AERO
INHALATION_SPRAY | RESPIRATORY_TRACT | Status: DC | PRN
  Administered 2011-07-01: 2 via RESPIRATORY_TRACT

## 2011-07-01 MED ORDER — MIDAZOLAM HCL 2 MG/2ML IJ SOLN
1.0000 mg | INTRAMUSCULAR | Status: DC | PRN
  Administered 2011-07-01 (×2): 2 mg via INTRAVENOUS

## 2011-07-01 MED ORDER — ONDANSETRON HCL 4 MG/2ML IJ SOLN
4.0000 mg | Freq: Once | INTRAMUSCULAR | Status: AC | PRN
  Administered 2011-07-01: 4 mg via INTRAVENOUS

## 2011-07-01 MED ORDER — FENTANYL CITRATE 0.05 MG/ML IJ SOLN
INTRAMUSCULAR | Status: AC
  Administered 2011-07-01: 50 ug via INTRAVENOUS
  Filled 2011-07-01: qty 5

## 2011-07-01 MED ORDER — ROCURONIUM BROMIDE 100 MG/10ML IV SOLN
INTRAVENOUS | Status: DC | PRN
  Administered 2011-07-01: 35 mg via INTRAVENOUS

## 2011-07-01 MED ORDER — ROCURONIUM BROMIDE 50 MG/5ML IV SOLN
INTRAVENOUS | Status: AC
  Filled 2011-07-01: qty 1

## 2011-07-01 MED ORDER — ALBUTEROL SULFATE HFA 108 (90 BASE) MCG/ACT IN AERS
INHALATION_SPRAY | RESPIRATORY_TRACT | Status: AC
  Filled 2011-07-01: qty 6.7

## 2011-07-01 MED ORDER — LACTATED RINGERS IV SOLN
INTRAVENOUS | Status: DC
  Administered 2011-07-01: 1000 mL via INTRAVENOUS
  Administered 2011-07-01: 12:00:00 via INTRAVENOUS

## 2011-07-01 MED ORDER — LIDOCAINE HCL (PF) 1 % IJ SOLN
INTRAMUSCULAR | Status: AC
  Filled 2011-07-01: qty 5

## 2011-07-01 MED ORDER — BUPIVACAINE HCL (PF) 0.25 % IJ SOLN
INTRAMUSCULAR | Status: DC | PRN
  Administered 2011-07-01: 10 mL
  Administered 2011-07-01: 20 mL

## 2011-07-01 MED ORDER — FENTANYL CITRATE 0.05 MG/ML IJ SOLN
INTRAMUSCULAR | Status: DC | PRN
  Administered 2011-07-01 (×3): 50 ug via INTRAVENOUS

## 2011-07-01 MED ORDER — FENTANYL CITRATE 0.05 MG/ML IJ SOLN
INTRAMUSCULAR | Status: AC
  Filled 2011-07-01: qty 2

## 2011-07-01 MED ORDER — KETOROLAC TROMETHAMINE 10 MG PO TABS: 10.0000 mg | ORAL_TABLET | Freq: Four times a day (QID) | ORAL | Status: AC | PRN

## 2011-07-01 MED ORDER — LIDOCAINE HCL 1 % IJ SOLN
INTRAMUSCULAR | Status: DC | PRN
  Administered 2011-07-01: 40 mg via INTRADERMAL

## 2011-07-01 MED ORDER — ONDANSETRON HCL 4 MG/2ML IJ SOLN
4.0000 mg | Freq: Once | INTRAMUSCULAR | Status: AC
  Administered 2011-07-01: 4 mg via INTRAVENOUS

## 2011-07-01 MED ORDER — MIDAZOLAM HCL 2 MG/2ML IJ SOLN
INTRAMUSCULAR | Status: AC
  Filled 2011-07-01: qty 2

## 2011-07-01 MED ORDER — GLYCOPYRROLATE 0.2 MG/ML IJ SOLN
INTRAMUSCULAR | Status: DC | PRN
  Administered 2011-07-01: .6 mg via INTRAVENOUS

## 2011-07-01 MED ORDER — KETOROLAC TROMETHAMINE 30 MG/ML IJ SOLN
30.0000 mg | Freq: Once | INTRAMUSCULAR | Status: AC
  Administered 2011-07-01: 30 mg via INTRAVENOUS

## 2011-07-01 MED ORDER — OXYCODONE-ACETAMINOPHEN 5-325 MG PO TABS: 1.0000 | ORAL_TABLET | ORAL | Status: AC | PRN

## 2011-07-01 MED ORDER — KETOROLAC TROMETHAMINE 30 MG/ML IJ SOLN
INTRAMUSCULAR | Status: AC
  Administered 2011-07-01: 30 mg via INTRAVENOUS
  Filled 2011-07-01: qty 1

## 2011-07-01 MED ORDER — FENTANYL CITRATE 0.05 MG/ML IJ SOLN
INTRAMUSCULAR | Status: AC
  Administered 2011-07-01: 50 ug via INTRAVENOUS
  Filled 2011-07-01: qty 2

## 2011-07-01 MED ORDER — 0.9 % SODIUM CHLORIDE (POUR BTL) OPTIME
TOPICAL | Status: DC | PRN
  Administered 2011-07-01: 1000 mL

## 2011-07-01 MED ORDER — SODIUM CHLORIDE 0.9 % IR SOLN
Status: DC | PRN
  Administered 2011-07-01: 3000 mL

## 2011-07-01 MED ORDER — DEXTROSE 5 % IV SOLN
INTRAVENOUS | Status: DC | PRN
  Administered 2011-07-01: 20 mL via INTRAVENOUS

## 2011-07-01 MED ORDER — MIDAZOLAM HCL 2 MG/2ML IJ SOLN
INTRAMUSCULAR | Status: AC
  Administered 2011-07-01: 2 mg via INTRAVENOUS
  Filled 2011-07-01: qty 2

## 2011-07-01 MED ORDER — KETOROLAC TROMETHAMINE 10 MG PO TABS: 10.0000 mg | ORAL_TABLET | Freq: Four times a day (QID) | ORAL | Status: DC | PRN

## 2011-07-01 MED ORDER — BUPIVACAINE HCL (PF) 0.25 % IJ SOLN
INTRAMUSCULAR | Status: AC
  Filled 2011-07-01: qty 30

## 2011-07-01 MED ORDER — NEOSTIGMINE METHYLSULFATE 1 MG/ML IJ SOLN
INTRAMUSCULAR | Status: DC | PRN
  Administered 2011-07-01: 3 mg via INTRAVENOUS

## 2011-07-01 MED ORDER — PROPOFOL 10 MG/ML IV EMUL
INTRAVENOUS | Status: AC
  Filled 2011-07-01: qty 20

## 2011-07-01 MED ORDER — FENTANYL CITRATE 0.05 MG/ML IJ SOLN
25.0000 ug | INTRAMUSCULAR | Status: DC | PRN
  Administered 2011-07-01 (×2): 50 ug via INTRAVENOUS

## 2011-07-01 SURGICAL SUPPLY — 41 items
BAG DECANTER FOR FLEXI CONT (MISCELLANEOUS) ×4 IMPLANT
BAG HAMPER (MISCELLANEOUS) ×4 IMPLANT
BENZOIN TINCTURE PRP APPL 2/3 (GAUZE/BANDAGES/DRESSINGS) ×4 IMPLANT
CATH ROBINSON RED A/P 16FR (CATHETERS) ×4 IMPLANT
CATH THERMACHOICE III (CATHETERS) ×4 IMPLANT
CLOSURE STERI-STRIP 1/4X4 (GAUZE/BANDAGES/DRESSINGS) ×4 IMPLANT
CLOTH BEACON ORANGE TIMEOUT ST (SAFETY) ×4 IMPLANT
COVER MAYO STAND XLG (DRAPE) ×4 IMPLANT
COVER SURGICAL LIGHT HANDLE (MISCELLANEOUS) ×8 IMPLANT
DRAPE PROXIMA HALF (DRAPES) ×4 IMPLANT
DRESSING COVERLET 3X1 FLEXIBLE (GAUZE/BANDAGES/DRESSINGS) ×8 IMPLANT
FORMALIN 10 PREFIL 480ML (MISCELLANEOUS) IMPLANT
GLOVE ECLIPSE 6.5 STRL STRAW (GLOVE) ×4 IMPLANT
GLOVE ECLIPSE 9.0 STRL (GLOVE) ×4 IMPLANT
GLOVE EXAM NITRILE MD LF STRL (GLOVE) ×4 IMPLANT
GLOVE INDICATOR 7.0 STRL GRN (GLOVE) ×4 IMPLANT
GLOVE INDICATOR STER SZ 9 (GLOVE) ×4 IMPLANT
GOWN STRL REIN 3XL LVL4 (GOWN DISPOSABLE) ×4 IMPLANT
GOWN STRL REIN XL XLG (GOWN DISPOSABLE) ×8 IMPLANT
INST SET HYSTEROSCOPY (KITS) ×4 IMPLANT
INST SET LAPROSCOPIC GYN AP (KITS) ×4 IMPLANT
IV D5W 500ML (IV SOLUTION) ×4 IMPLANT
IV NS IRRIG 3000ML ARTHROMATIC (IV SOLUTION) ×4 IMPLANT
KIT ROOM TURNOVER AP CYSTO (KITS) ×4 IMPLANT
MANIFOLD NEPTUNE II (INSTRUMENTS) ×4 IMPLANT
MANIFOLD NEPTUNE WASTE (CANNULA) ×4 IMPLANT
NEEDLE INSUFFLATION 14GA 120MM (NEEDLE) ×4 IMPLANT
NS IRRIG 1000ML POUR BTL (IV SOLUTION) ×4 IMPLANT
PACK PERI GYN (CUSTOM PROCEDURE TRAY) ×4 IMPLANT
PAD ARMBOARD 7.5X6 YLW CONV (MISCELLANEOUS) ×4 IMPLANT
PAD TELFA 3X4 1S STER (GAUZE/BANDAGES/DRESSINGS) ×4 IMPLANT
RING FALLOPIAN BANDS (Ring) ×4 IMPLANT
SET BASIN LINEN APH (SET/KITS/TRAYS/PACK) ×4 IMPLANT
SET IRRIG Y TYPE TUR BLADDER L (SET/KITS/TRAYS/PACK) ×4 IMPLANT
SOLUTION ANTI FOG 6CC (MISCELLANEOUS) ×4 IMPLANT
STRIP CLOSURE SKIN 1/4X3 (GAUZE/BANDAGES/DRESSINGS) ×4 IMPLANT
SUT PROLENE 2 0 SH 30 (SUTURE) ×4 IMPLANT
TROCAR KII 8X100ML NONTHREADED (TROCAR) ×4 IMPLANT
TROCAR Z-THREAD FIOS 5X100MM (TROCAR) ×4 IMPLANT
TUBING INSUFFLATION HIGH FLOW (TUBING) ×4 IMPLANT
WARMER LAPAROSCOPE (MISCELLANEOUS) ×4 IMPLANT

## 2011-07-01 NOTE — Anesthesia Postprocedure Evaluation (Signed)
  Anesthesia Post-op Note  Patient: Ashlee Stein  Procedure(s) Performed: Procedure(s) (LRB): DILATATION & CURETTAGE/HYSTEROSCOPY WITH THERMACHOICE ABLATION (N/A) LAPAROSCOPIC TUBAL LIGATION (Bilateral) HERNIA REPAIR UMBILICAL ADULT (N/A) CERVICAL POLYPECTOMY (N/A)  Patient Location: PACU  Anesthesia Type: General  Level of Consciousness: awake, alert  and oriented  Airway and Oxygen Therapy: Patient Spontanous Breathing  Post-op Pain: none  Post-op Assessment: Post-op Vital signs reviewed, Patient's Cardiovascular Status Stable, Respiratory Function Stable, Patent Airway and No signs of Nausea or vomiting  Post-op Vital Signs: Reviewed and stable  Complications: No apparent anesthesia complications

## 2011-07-01 NOTE — Transfer of Care (Signed)
Immediate Anesthesia Transfer of Care Note  Patient: Ashlee Stein  Procedure(s) Performed: Procedure(s) (LRB): DILATATION & CURETTAGE/HYSTEROSCOPY WITH THERMACHOICE ABLATION (N/A) LAPAROSCOPIC TUBAL LIGATION (Bilateral) HERNIA REPAIR UMBILICAL ADULT (N/A) CERVICAL POLYPECTOMY (N/A)  Patient Location: PACU  Anesthesia Type: General  Level of Consciousness: awake, alert  and oriented  Airway & Oxygen Therapy: Patient Spontanous Breathing and Patient connected to face mask oxygen  Post-op Assessment: Report given to PACU RN  Post vital signs: Reviewed and stable  Complications: No apparent anesthesia complications, though patient did wake up agitated with some emergence delireum.

## 2011-07-01 NOTE — Discharge Instructions (Signed)
Hernia, Surgical Repair Care After Refer to this sheet in the next few weeks. These discharge instructions provide you with general information on caring for yourself after you leave the hospital. Your caregiver may also give you specific instructions. Your treatment has been planned according to the most current medical practices available, but unavoidable complications sometimes occur. If you have any problems or questions after discharge, please call your caregiver. HOME CARE INSTRUCTIONS   It is normal to be sore for a couple weeks after surgery. See your caregiver if this seems to be getting worse rather than better.   Put ice on the operative site.   Put ice in a plastic bag.   Place a towel between your skin and the bag.   Leave the ice on for 15 to 20 minutes at a time, 3 to 4 times a day for the first 2 days.   Change bandages (dressings) as directed.   Keep the wound dry and clean. The wound may be washed gently with soap and water. Gently blot or dab the wound dry. Do not take baths, use swimming pools, or use hot tubs for 10 days, or as directed by your caregiver.   Only take over-the-counter or prescription medicines for pain, discomfort, or fever as directed by your caregiver.   Continue your normal diet as directed.   Do not drive until your caregiver says it is okay.   Do not lift anything more than 10 pounds or play contact sports for 3 weeks, or as directed.   Make an appointment to see your caregiver for stitches (sutures) or staple removal when instructed.  SEEK MEDICAL CARE IF:   You have increased bleeding coming from the wounds.   You have blood in your stool.   You see redness, swelling, or have increasing pain in the wounds.   You have fluid (pus) coming from the wound.   You have an oral temperature above 102 F (38.9 C).   You notice a bad smell coming from the wound or dressing.   You develop lightheadedness or feel faint.  SEEK IMMEDIATE  MEDICAL CARE IF:   You develop a rash.   You have difficulty breathing.   You develop any reaction or side effects to medicines given.  MAKE SURE YOU:   Understand these instructions.   Will watch your condition.   Will get help right away if you are not doing well or get worse.  Document Released: 08/30/2004 Document Revised: 01/30/2011 Document Reviewed: 01/10/2009 [facility] Patient Information 2012 [place], [place].Endometrial Ablation Endometrial ablation removes the lining of the uterus (endometrium). It is usually a same day, outpatient treatment. Ablation helps avoid major surgery (such as a hysterectomy). A hysterectomy is removal of the cervix and uterus. Endometrial ablation has less risk and complications, has a shorter recovery period and is less expensive. After endometrial ablation, most women will have little or no menstrual bleeding. You may not keep your fertility. Pregnancy is no longer likely after this procedure but if you are pre-menopausal, you still need to use a reliable method of birth control following the procedure because pregnancy can occur. REASONS TO HAVE THE PROCEDURE MAY INCLUDE:  Heavy periods.   Bleeding that is causing anemia.   Anovulatory bleeding, very irregular, bleeding.   Bleeding submucous fibroids (on the lining inside the uterus) if they are smaller than 3 centimeters.  REASONS NOT TO HAVE THE PROCEDURE MAY INCLUDE:  You wish to have more children.   You have a  pre-cancerous or cancerous problem. The cause of any abnormal bleeding must be diagnosed before having the procedure.   You have pain coming from the uterus.   You have a submucus fibroid larger than 3 centimeters.   You recently had a baby.   You recently had an infection in the uterus.   You have a severe retro-flexed, tipped uterus and cannot insert the instrument to do the ablation.   You had a Cesarean section or deep major surgery on the uterus.   The inner cavity of  the uterus is too large for the endometrial ablation instrument.  RISKS AND COMPLICATIONS   Perforation of the uterus.   Bleeding.   Infection of the uterus, bladder or vagina.   Injury to surrounding organs.   Cutting the cervix.   An air bubble to the lung (air embolus).   Pregnancy following the procedure.   Failure of the procedure to help the problem requiring hysterectomy.   Decreased ability to diagnose cancer in the lining of the uterus.  BEFORE THE PROCEDURE  The lining of the uterus must be tested to make sure there is no pre-cancerous or cancer cells present.   Medications may be given to make the lining of the uterus thinner.   Ultrasound may be used to evaluate the size and look for abnormalities of the uterus.   Future pregnancy is not desired.  PROCEDURE  There are different ways to destroy the lining of the uterus.   Resectoscope - radio frequency-alternating electric current is the most common one used.   Cryotherapy - freezing the lining of the uterus.   Heated Free Liquid - heated salt (saline) solution inserted into the uterus.   Microwave - uses high energy microwaves in the uterus.   Thermal Balloon - a catheter with a balloon tip is inserted into the uterus and filled with heated fluid.  Your caregiver will talk with you about the method used in this clinic. They will also instruct you on the pros and cons of the procedure. Endometrial ablation is performed along with a procedure called operative hysteroscopy. A narrow viewing tube is inserted through the birth canal (vagina) and through the cervix into the uterus. A tiny camera attached to the viewing tube (hysteroscope) allows the uterine cavity to be shown on a TV monitor during surgery. Your uterus is filled with a harmless liquid to make the procedure easier. The lining of the uterus is then removed. The lining can also be removed with a resectoscope which allows your surgeon to cut away the lining  of the uterus under direct vision. Usually, you will be able to go home within an hour after the procedure. HOME CARE INSTRUCTIONS   Do not drive for 24 hours.   No tampons, douching or intercourse for 2 weeks or until your caregiver approves.   Rest at home for 24 to 48 hours. You may then resume normal activities unless told differently by your caregiver.   Take your temperature two times a day for 4 days, and record it.   Take any medications your caregiver has ordered, as directed.   Use some form of contraception if you are pre-menopausal and do not want to get pregnant.  Bleeding after the procedure is normal. It varies from light spotting and mildly watery to bloody discharge for 4 to 6 weeks. You may also have mild cramping. Only take over-the-counter or prescription medicines for pain, discomfort, or fever as directed by your caregiver. Do not  use aspirin, as this may aggravate bleeding. Frequent urination during the first 24 hours is normal. You will not know how effective your surgery is until at least 3 months after the surgery. SEEK IMMEDIATE MEDICAL CARE IF:   Bleeding is heavier than a normal menstrual cycle.   An oral temperature above 102 F (38.9 C) develops.   You have increasing cramps or pains not relieved with medication or develop belly (abdominal) pain which does not seem to be related to the same area of earlier cramping and pain.   You are light headed, weak or have fainting episodes.   You develop pain in the shoulder strap areas.   You have chest or leg pain.   You have abnormal vaginal discharge.   You have painful urination.  Document Released: 12/21/2003 Document Revised: 01/30/2011 Document Reviewed: 03/20/2007 [facility] Patient Information 2012 [place], [place].Umbilical Hernia, Child Your child has an umbilical hernia. Hernia is a weakness in the wall of the abdomen. Umbilical hernias will usually look like a big bellybutton with extra loose skin.  They can stick out when a loop of bowel slips into the hernia defect and gets pushed out between the muscles. If this happens, the bowel can almost always be pushed back in place without hurting your child. If the hernia is very large, surgery may be necessary. If the intestine becomes stuck in the hernia sack and cannot be pushed back in, then an operation is needed right away to prevent damage to the bowel. Talk with your child's caregiver about the need for surgery. SEEK IMMEDIATE MEDICAL CARE IF:   Your child develops extreme fussiness and repeated vomiting.   Your child develops severe abdominal pain or will not eat.   You are unable to push the hernia contents back into the belly.  Document Released: 03/20/2004 Document Revised: 01/30/2011 Document Reviewed: 07/25/2009 [facility] Patient Information 2012 [place], [place].

## 2011-07-01 NOTE — Anesthesia Procedure Notes (Signed)
Procedure Name: Intubation Date/Time: 07/01/2011 11:04 AM Performed by: Glynn Octave E Pre-anesthesia Checklist: Patient identified, Patient being monitored, Timeout performed, Emergency Drugs available and Suction available Patient Re-evaluated:Patient Re-evaluated prior to inductionOxygen Delivery Method: Circle System Utilized Preoxygenation: Pre-oxygenation with 100% oxygen Intubation Type: IV induction, Rapid sequence and Cricoid Pressure applied Ventilation: Mask ventilation without difficulty Laryngoscope Size: Mac and 3 Grade View: Grade I Tube type: Oral Tube size: 7.0 mm Number of attempts: 1 Airway Equipment and Method: stylet Placement Confirmation: ETT inserted through vocal cords under direct vision,  positive ETCO2 and breath sounds checked- equal and bilateral Secured at: 21 cm Tube secured with: Tape Dental Injury: Teeth and Oropharynx as per pre-operative assessment

## 2011-07-01 NOTE — Anesthesia Preprocedure Evaluation (Signed)
Anesthesia Evaluation  Patient identified by MRN, date of birth, ID band Patient awake    Reviewed: Allergy & Precautions, H&P , NPO status , Patient's Chart, lab work & pertinent test results, reviewed documented beta blocker date and time   Airway Mallampati: II TM Distance: >3 FB Neck ROM: Full    Dental  (+) Teeth Intact   Pulmonary asthma ,  breath sounds clear to auscultation        Cardiovascular hypertension, + angina with exertion + CAD and + CABG Rhythm:Regular Rate:Normal     Neuro/Psych    GI/Hepatic GERD-  Medicated and Controlled,  Endo/Other    Renal/GU      Musculoskeletal   Abdominal   Peds  Hematology   Anesthesia Other Findings   Reproductive/Obstetrics                           Anesthesia Physical Anesthesia Plan  ASA: III  Anesthesia Plan: General   Post-op Pain Management:    Induction: Intravenous, Rapid sequence and Cricoid pressure planned  Airway Management Planned:   Additional Equipment:   Intra-op Plan:   Post-operative Plan: Extubation in OR  Informed Consent: I have reviewed the patients History and Physical, chart, labs and discussed the procedure including the risks, benefits and alternatives for the proposed anesthesia with the patient or authorized representative who has indicated his/her understanding and acceptance.     Plan Discussed with:   Anesthesia Plan Comments:         Anesthesia Quick Evaluation

## 2011-07-01 NOTE — Op Note (Signed)
C. operative details included and brief operative note

## 2011-07-01 NOTE — Brief Op Note (Signed)
07/01/2011  1:03 PM  PATIENT:  Ashlee Stein  46 y.o. female  PRE-OPERATIVE DIAGNOSIS:  menometrorrhagia uterine fibroids, desires sterilization, endometrial polyps  POST-OPERATIVE DIAGNOSIS:  menometrorrhagia uterine fibroids, desires sterilization, endometrial polyps, unbilical hernia  PROCEDURE:  Procedure(s) (LRB): DILATATION & CURETTAGE/HYSTEROSCOPY WITH THERMACHOICE ABLATION (N/A) LAPAROSCOPIC TUBAL LIGATION (Bilateral) HERNIA REPAIR UMBILICAL ADULT (N/A)  (N/A) endometrial polypectomy  SURGEON:  Surgeon(s) and Role:    * Tilda Burrow, MD - Primary  PHYSICIAN ASSISTANT:   ASSISTANTS: Maggie. Henderson CST FA   ANESTHESIA: Gen. with endotracheal intubation local anesthesia at tubal sterilization and around uterus   EBL:  Total I/O In: 1000 [I.V.:1000] Out: 25 [Blood:25]  BLOOD ADMINISTERED:none  DRAINS: none   LOCAL MEDICATIONS USED:  MARCAINE    and Amount: 30 ml  SPECIMEN:  Source of Specimen:  Endometrial curettings  DISPOSITION OF SPECIMEN:  PATHOLOGY  COUNTS:  YES  TOURNIQUET:  * No tourniquets in log *  DICTATION: .Dragon Dictation Patient was taken to the operating room prepped and draped for combined abdominal and vaginal procedure with the legs in low lithotomy support timeout was conducted and confirmed by surgical team. Attention was first directed to the cervix were with difficulty the cervix could be identified and a [place] uterine manipulator attached to the cervix and uterus for mobilization of the uterine body. Attention was then directed to the umbilicus where it was obvious that there was approximate 1.5 cm hernia. A skin incision was made into the umbilical hernia and the fascial edges of the skin hernia defect identified. The hernia was larger than the trocar plan for the surgery so interrupted sutures of 2-0 Prolene were used to reduce the hernia defect to where the pneumoperitoneum could be preserved. The 5 mm trocar was left through the  hernia site into the abdomen and camera used after insufflation to visualize the enlarged uterus normal tubes and ovaries bilaterally. There is no evidence of intra-abdominal, associated with entry into the hernia attention was directed to the left tube and through the suprapubic trocar site a Falope ring application to the midportion of the left tube was performed. Marcaine was injected beneath the tube at its Falope ring. Similarly the opposite tube was treated with 2-0 Prolene used to complete the hernia repair. Skin incisions were closed with subcuticular 4-0 Vicryl. Sponge and needle counts were correct Attention was directed to the vagina where speculum was repositioned cervix grasped the uterus sounded to 11 cm, with cervix dilated to 23 [place] Introduction of the 30 rigid hysteroscope without difficulty the uterus had a posterior fibroid deforming the posterior wall slightly, and had to medium-sized 1 cm polyps coming from the anterior wall. These were amputated from their stalk and taken as a specimen. Smooth sharp curettage was performed to satisfactorily evacuate the remainder portion the uterus. There is moderate blood associated with this curettage, estimated 25 cc. Gynecare ThermaChoice 3 endometrial ablation device was then inserted and insufflated with 20 cc of D5W and the 8 minute thermal ablation sequence completed. Upon removal of the 20 cc of fluid and the Gynecare ThermaChoice ablation device.,, Paracervical block with 20 cc of Marcaine with epinephrine was performed. Sponge and needle counts were correct. Patient was allowed to go recovery room in good condition. PLAN OF CARE: Discharge to home after PACU  PATIENT DISPOSITION:  PACU - hemodynamically stable.   Delay start of Pharmacological VTE agent (>24hrs) due to surgical blood loss or risk of bleeding: not applicable

## 2011-07-04 ENCOUNTER — Encounter (HOSPITAL_COMMUNITY): Payer: Self-pay | Admitting: Obstetrics and Gynecology

## 2011-07-09 ENCOUNTER — Telehealth: Payer: Self-pay | Admitting: Cardiology

## 2011-07-09 NOTE — Telephone Encounter (Signed)
New msg [employer] had some questions on whether she is still in rehab. Please call

## 2011-07-09 NOTE — Telephone Encounter (Signed)
I left [place] a message to call back.

## 2011-07-09 NOTE — Telephone Encounter (Signed)
I spoke with [facility] and she wanted to check on why Dr Riley Kill gave pt weight restrictions on return to work note.  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  I did make her aware that the pt has completed another session of cardiac rehab on 06/25/11.

## 2011-07-15 ENCOUNTER — Telehealth (HOSPITAL_COMMUNITY): Payer: Self-pay | Admitting: *Deleted

## 2011-07-16 ENCOUNTER — Encounter (HOSPITAL_COMMUNITY)
Admission: RE | Admit: 2011-07-16 | Discharge: 2011-07-16 | Disposition: A | Discharge status: Home / Self Care | Payer: Self-pay | Source: Ambulatory Visit | Attending: Cardiology | Admitting: Cardiology

## 2011-07-17 ENCOUNTER — Encounter (HOSPITAL_COMMUNITY): Payer: Self-pay

## 2011-07-22 ENCOUNTER — Encounter (HOSPITAL_COMMUNITY)
Admission: RE | Admit: 2011-07-22 | Discharge: 2011-07-22 | Disposition: A | Discharge status: Home / Self Care | Payer: Self-pay | Source: Ambulatory Visit | Attending: Cardiology | Admitting: Cardiology

## 2011-07-23 ENCOUNTER — Encounter (HOSPITAL_COMMUNITY): Payer: Self-pay

## 2011-07-24 ENCOUNTER — Encounter (HOSPITAL_COMMUNITY)
Admission: RE | Admit: 2011-07-24 | Discharge: 2011-07-24 | Disposition: A | Discharge status: Home / Self Care | Payer: Self-pay | Source: Ambulatory Visit | Attending: Cardiology | Admitting: Cardiology

## 2011-07-29 ENCOUNTER — Encounter (HOSPITAL_COMMUNITY): Payer: BC Managed Care – PPO

## 2011-07-29 DIAGNOSIS — I10 Essential (primary) hypertension: Secondary | ICD-10-CM | POA: Insufficient documentation

## 2011-07-29 DIAGNOSIS — Z951 Presence of aortocoronary bypass graft: Secondary | ICD-10-CM | POA: Insufficient documentation

## 2011-07-29 DIAGNOSIS — Z5189 Encounter for other specified aftercare: Secondary | ICD-10-CM | POA: Insufficient documentation

## 2011-07-29 DIAGNOSIS — E785 Hyperlipidemia, unspecified: Secondary | ICD-10-CM | POA: Insufficient documentation

## 2011-07-29 DIAGNOSIS — I2542 Coronary artery dissection: Secondary | ICD-10-CM | POA: Insufficient documentation

## 2011-07-29 DIAGNOSIS — D649 Anemia, unspecified: Secondary | ICD-10-CM | POA: Insufficient documentation

## 2011-07-29 DIAGNOSIS — I252 Old myocardial infarction: Secondary | ICD-10-CM | POA: Insufficient documentation

## 2011-07-30 ENCOUNTER — Encounter (HOSPITAL_COMMUNITY): Payer: BC Managed Care – PPO

## 2011-07-31 ENCOUNTER — Encounter (HOSPITAL_COMMUNITY)
Admission: RE | Admit: 2011-07-31 | Discharge: 2011-07-31 | Disposition: A | Discharge status: Home / Self Care | Payer: Self-pay | Source: Ambulatory Visit | Attending: Cardiology | Admitting: Cardiology

## 2011-08-05 ENCOUNTER — Encounter (HOSPITAL_COMMUNITY): Payer: BC Managed Care – PPO

## 2011-08-06 ENCOUNTER — Encounter (HOSPITAL_COMMUNITY): Payer: BC Managed Care – PPO

## 2011-08-07 ENCOUNTER — Encounter (HOSPITAL_COMMUNITY): Payer: BC Managed Care – PPO

## 2011-08-12 ENCOUNTER — Encounter (HOSPITAL_COMMUNITY): Payer: BC Managed Care – PPO

## 2011-08-13 ENCOUNTER — Encounter (HOSPITAL_COMMUNITY): Payer: BC Managed Care – PPO

## 2011-08-14 ENCOUNTER — Encounter (HOSPITAL_COMMUNITY): Payer: BC Managed Care – PPO

## 2011-08-19 ENCOUNTER — Encounter (HOSPITAL_COMMUNITY): Payer: BC Managed Care – PPO

## 2011-08-20 ENCOUNTER — Encounter (HOSPITAL_COMMUNITY): Payer: BC Managed Care – PPO

## 2011-08-21 ENCOUNTER — Telehealth: Payer: Self-pay | Admitting: Cardiology

## 2011-08-21 ENCOUNTER — Encounter (HOSPITAL_COMMUNITY): Payer: BC Managed Care – PPO

## 2011-08-21 NOTE — Telephone Encounter (Signed)
Please return call to patient at [phone], regarding previous medical procedure.

## 2011-08-21 NOTE — Telephone Encounter (Signed)
Left message to call back  

## 2011-08-25 NOTE — Telephone Encounter (Signed)
I spoke with the patient and she wanted to know the medical diagnosis for her cardiac condition that brought her into the hospital for her initial evaluation.  I made her aware that this was called a spontaneous coronary artery dissection.

## 2011-08-26 ENCOUNTER — Encounter (HOSPITAL_COMMUNITY)
Admission: RE | Admit: 2011-08-26 | Discharge: 2011-08-26 | Disposition: A | Discharge status: Home / Self Care | Payer: Self-pay | Source: Ambulatory Visit | Attending: Cardiology | Admitting: Cardiology

## 2011-08-26 DIAGNOSIS — E785 Hyperlipidemia, unspecified: Secondary | ICD-10-CM | POA: Insufficient documentation

## 2011-08-26 DIAGNOSIS — I252 Old myocardial infarction: Secondary | ICD-10-CM | POA: Insufficient documentation

## 2011-08-26 DIAGNOSIS — I10 Essential (primary) hypertension: Secondary | ICD-10-CM | POA: Insufficient documentation

## 2011-08-26 DIAGNOSIS — D649 Anemia, unspecified: Secondary | ICD-10-CM | POA: Insufficient documentation

## 2011-08-26 DIAGNOSIS — Z5189 Encounter for other specified aftercare: Secondary | ICD-10-CM | POA: Insufficient documentation

## 2011-08-26 DIAGNOSIS — I2542 Coronary artery dissection: Secondary | ICD-10-CM | POA: Insufficient documentation

## 2011-08-26 DIAGNOSIS — Z951 Presence of aortocoronary bypass graft: Secondary | ICD-10-CM | POA: Insufficient documentation

## 2011-08-27 ENCOUNTER — Encounter (HOSPITAL_COMMUNITY)
Admission: RE | Admit: 2011-08-27 | Discharge: 2011-08-27 | Disposition: A | Discharge status: Home / Self Care | Payer: Self-pay | Source: Ambulatory Visit | Attending: Cardiology | Admitting: Cardiology

## 2011-08-28 ENCOUNTER — Encounter (HOSPITAL_COMMUNITY): Payer: Self-pay

## 2011-09-02 ENCOUNTER — Encounter (HOSPITAL_COMMUNITY): Payer: Self-pay

## 2011-09-03 ENCOUNTER — Encounter (HOSPITAL_COMMUNITY)
Admission: RE | Admit: 2011-09-03 | Discharge: 2011-09-03 | Disposition: A | Discharge status: Home / Self Care | Payer: Self-pay | Source: Ambulatory Visit | Attending: Cardiology | Admitting: Cardiology

## 2011-09-04 ENCOUNTER — Encounter (HOSPITAL_COMMUNITY): Payer: Self-pay

## 2011-09-09 ENCOUNTER — Encounter (HOSPITAL_COMMUNITY): Payer: Self-pay

## 2011-09-10 ENCOUNTER — Encounter (HOSPITAL_COMMUNITY)
Admission: RE | Admit: 2011-09-10 | Discharge: 2011-09-10 | Disposition: A | Discharge status: Home / Self Care | Payer: Self-pay | Source: Ambulatory Visit | Attending: Cardiology | Admitting: Cardiology

## 2011-09-11 ENCOUNTER — Encounter (HOSPITAL_COMMUNITY): Payer: Self-pay

## 2011-09-12 ENCOUNTER — Encounter (HOSPITAL_COMMUNITY)
Admission: RE | Admit: 2011-09-12 | Discharge: 2011-09-12 | Disposition: A | Discharge status: Home / Self Care | Payer: Self-pay | Source: Ambulatory Visit | Attending: Cardiology | Admitting: Cardiology

## 2011-09-16 ENCOUNTER — Encounter (HOSPITAL_COMMUNITY): Payer: Self-pay

## 2011-09-17 ENCOUNTER — Encounter (HOSPITAL_COMMUNITY)
Admission: RE | Admit: 2011-09-17 | Discharge: 2011-09-17 | Disposition: A | Discharge status: Home / Self Care | Payer: Self-pay | Source: Ambulatory Visit | Attending: Cardiology | Admitting: Cardiology

## 2011-09-18 ENCOUNTER — Encounter (HOSPITAL_COMMUNITY)
Admission: RE | Admit: 2011-09-18 | Discharge: 2011-09-18 | Disposition: A | Discharge status: Home / Self Care | Payer: Self-pay | Source: Ambulatory Visit | Attending: Cardiology | Admitting: Cardiology

## 2011-09-23 ENCOUNTER — Encounter (HOSPITAL_COMMUNITY): Payer: Self-pay

## 2011-09-24 ENCOUNTER — Encounter (HOSPITAL_COMMUNITY): Payer: Self-pay

## 2011-09-25 ENCOUNTER — Encounter (HOSPITAL_COMMUNITY): Payer: BC Managed Care – PPO

## 2011-09-25 DIAGNOSIS — D649 Anemia, unspecified: Secondary | ICD-10-CM | POA: Insufficient documentation

## 2011-09-25 DIAGNOSIS — Z5189 Encounter for other specified aftercare: Secondary | ICD-10-CM | POA: Insufficient documentation

## 2011-09-25 DIAGNOSIS — E785 Hyperlipidemia, unspecified: Secondary | ICD-10-CM | POA: Insufficient documentation

## 2011-09-25 DIAGNOSIS — Z951 Presence of aortocoronary bypass graft: Secondary | ICD-10-CM | POA: Insufficient documentation

## 2011-09-25 DIAGNOSIS — I2542 Coronary artery dissection: Secondary | ICD-10-CM | POA: Insufficient documentation

## 2011-09-25 DIAGNOSIS — I252 Old myocardial infarction: Secondary | ICD-10-CM | POA: Insufficient documentation

## 2011-09-25 DIAGNOSIS — I10 Essential (primary) hypertension: Secondary | ICD-10-CM | POA: Insufficient documentation

## 2011-09-30 ENCOUNTER — Encounter (HOSPITAL_COMMUNITY): Payer: BC Managed Care – PPO

## 2011-10-01 ENCOUNTER — Encounter (HOSPITAL_COMMUNITY): Payer: BC Managed Care – PPO

## 2011-10-02 ENCOUNTER — Encounter (HOSPITAL_COMMUNITY): Payer: BC Managed Care – PPO

## 2011-10-07 ENCOUNTER — Encounter (HOSPITAL_COMMUNITY): Payer: BC Managed Care – PPO

## 2011-10-08 ENCOUNTER — Encounter (HOSPITAL_COMMUNITY): Payer: BC Managed Care – PPO

## 2011-10-09 ENCOUNTER — Encounter (HOSPITAL_COMMUNITY): Payer: BC Managed Care – PPO

## 2011-10-14 ENCOUNTER — Encounter (HOSPITAL_COMMUNITY): Payer: BC Managed Care – PPO

## 2011-10-14 ENCOUNTER — Ambulatory Visit: Payer: BC Managed Care – PPO | Admitting: Cardiology

## 2011-10-15 ENCOUNTER — Encounter (HOSPITAL_COMMUNITY): Payer: BC Managed Care – PPO

## 2011-10-16 ENCOUNTER — Encounter (HOSPITAL_COMMUNITY): Payer: BC Managed Care – PPO

## 2011-10-21 ENCOUNTER — Encounter (HOSPITAL_COMMUNITY): Payer: BC Managed Care – PPO

## 2011-10-22 ENCOUNTER — Encounter (HOSPITAL_COMMUNITY)
Admission: RE | Admit: 2011-10-22 | Discharge: 2011-10-22 | Disposition: A | Discharge status: Home / Self Care | Payer: BC Managed Care – PPO | Source: Ambulatory Visit | Attending: Cardiology | Admitting: Cardiology

## 2011-10-23 ENCOUNTER — Encounter (HOSPITAL_COMMUNITY)
Admission: RE | Admit: 2011-10-23 | Discharge: 2011-10-23 | Disposition: A | Discharge status: Home / Self Care | Payer: BC Managed Care – PPO | Source: Ambulatory Visit | Attending: Cardiology | Admitting: Cardiology

## 2011-10-26 ENCOUNTER — Other Ambulatory Visit: Payer: Self-pay | Admitting: Physician Assistant

## 2011-10-28 ENCOUNTER — Encounter (HOSPITAL_COMMUNITY): Payer: BC Managed Care – PPO | Attending: Cardiology

## 2011-10-28 DIAGNOSIS — I10 Essential (primary) hypertension: Secondary | ICD-10-CM | POA: Insufficient documentation

## 2011-10-28 DIAGNOSIS — Z951 Presence of aortocoronary bypass graft: Secondary | ICD-10-CM | POA: Insufficient documentation

## 2011-10-28 DIAGNOSIS — Z5189 Encounter for other specified aftercare: Secondary | ICD-10-CM | POA: Insufficient documentation

## 2011-10-28 DIAGNOSIS — I2542 Coronary artery dissection: Secondary | ICD-10-CM | POA: Insufficient documentation

## 2011-10-28 DIAGNOSIS — I252 Old myocardial infarction: Secondary | ICD-10-CM | POA: Insufficient documentation

## 2011-10-28 DIAGNOSIS — D649 Anemia, unspecified: Secondary | ICD-10-CM | POA: Insufficient documentation

## 2011-10-28 DIAGNOSIS — E785 Hyperlipidemia, unspecified: Secondary | ICD-10-CM | POA: Insufficient documentation

## 2011-10-29 ENCOUNTER — Encounter (HOSPITAL_COMMUNITY): Payer: BC Managed Care – PPO

## 2011-10-29 NOTE — Telephone Encounter (Signed)
Fax Received. Refill Completed. Nara Paternoster Chowoe (R.M.A)   

## 2011-10-30 ENCOUNTER — Encounter (HOSPITAL_COMMUNITY): Payer: BC Managed Care – PPO

## 2011-11-03 ENCOUNTER — Ambulatory Visit: Payer: Self-pay | Admitting: Cardiology

## 2011-11-04 ENCOUNTER — Encounter (HOSPITAL_COMMUNITY): Payer: BC Managed Care – PPO

## 2011-11-05 ENCOUNTER — Encounter (HOSPITAL_COMMUNITY): Payer: BC Managed Care – PPO

## 2011-11-05 ENCOUNTER — Ambulatory Visit: Payer: BC Managed Care – PPO | Admitting: Cardiology

## 2011-11-06 ENCOUNTER — Encounter (HOSPITAL_COMMUNITY): Payer: BC Managed Care – PPO

## 2011-11-11 ENCOUNTER — Encounter (HOSPITAL_COMMUNITY): Payer: BC Managed Care – PPO

## 2011-11-12 ENCOUNTER — Encounter (HOSPITAL_COMMUNITY): Payer: BC Managed Care – PPO

## 2011-11-13 ENCOUNTER — Encounter (HOSPITAL_COMMUNITY): Payer: BC Managed Care – PPO

## 2011-11-18 ENCOUNTER — Encounter (HOSPITAL_COMMUNITY): Payer: BC Managed Care – PPO

## 2011-11-19 ENCOUNTER — Encounter (HOSPITAL_COMMUNITY): Payer: BC Managed Care – PPO

## 2011-11-20 ENCOUNTER — Encounter (HOSPITAL_COMMUNITY): Payer: BC Managed Care – PPO

## 2011-11-21 ENCOUNTER — Other Ambulatory Visit: Payer: Self-pay | Admitting: Physician Assistant

## 2011-11-25 ENCOUNTER — Telehealth (HOSPITAL_COMMUNITY): Payer: Self-pay | Admitting: *Deleted

## 2011-11-25 ENCOUNTER — Ambulatory Visit: Payer: Self-pay | Admitting: Cardiology

## 2011-11-25 ENCOUNTER — Encounter (HOSPITAL_COMMUNITY): Admission: RE | Admit: 2011-11-25 | Payer: BC Managed Care – PPO | Source: Ambulatory Visit

## 2011-11-26 ENCOUNTER — Encounter (HOSPITAL_COMMUNITY): Payer: BC Managed Care – PPO | Attending: Cardiology

## 2011-11-26 DIAGNOSIS — D649 Anemia, unspecified: Secondary | ICD-10-CM | POA: Insufficient documentation

## 2011-11-26 DIAGNOSIS — I2542 Coronary artery dissection: Secondary | ICD-10-CM | POA: Insufficient documentation

## 2011-11-26 DIAGNOSIS — E785 Hyperlipidemia, unspecified: Secondary | ICD-10-CM | POA: Insufficient documentation

## 2011-11-26 DIAGNOSIS — I10 Essential (primary) hypertension: Secondary | ICD-10-CM | POA: Insufficient documentation

## 2011-11-26 DIAGNOSIS — Z5189 Encounter for other specified aftercare: Secondary | ICD-10-CM | POA: Insufficient documentation

## 2011-11-26 DIAGNOSIS — I252 Old myocardial infarction: Secondary | ICD-10-CM | POA: Insufficient documentation

## 2011-11-26 DIAGNOSIS — Z951 Presence of aortocoronary bypass graft: Secondary | ICD-10-CM | POA: Insufficient documentation

## 2011-11-27 ENCOUNTER — Encounter (HOSPITAL_COMMUNITY): Payer: BC Managed Care – PPO

## 2011-12-02 ENCOUNTER — Encounter (HOSPITAL_COMMUNITY): Payer: BC Managed Care – PPO

## 2011-12-03 ENCOUNTER — Encounter (HOSPITAL_COMMUNITY): Payer: BC Managed Care – PPO

## 2011-12-04 ENCOUNTER — Encounter (HOSPITAL_COMMUNITY): Payer: BC Managed Care – PPO

## 2011-12-08 ENCOUNTER — Encounter (HOSPITAL_COMMUNITY): Payer: Self-pay | Admitting: *Deleted

## 2011-12-09 ENCOUNTER — Encounter (HOSPITAL_COMMUNITY): Payer: BC Managed Care – PPO

## 2011-12-10 ENCOUNTER — Encounter (HOSPITAL_COMMUNITY): Payer: BC Managed Care – PPO

## 2011-12-10 ENCOUNTER — Ambulatory Visit: Payer: Self-pay | Admitting: Cardiology

## 2011-12-11 ENCOUNTER — Encounter (HOSPITAL_COMMUNITY): Payer: BC Managed Care – PPO

## 2011-12-16 ENCOUNTER — Encounter (HOSPITAL_COMMUNITY): Payer: BC Managed Care – PPO

## 2011-12-17 ENCOUNTER — Encounter (HOSPITAL_COMMUNITY): Payer: BC Managed Care – PPO

## 2011-12-17 ENCOUNTER — Ambulatory Visit: Payer: Self-pay | Admitting: Cardiology

## 2011-12-18 ENCOUNTER — Encounter (HOSPITAL_COMMUNITY): Payer: BC Managed Care – PPO

## 2011-12-23 ENCOUNTER — Encounter (HOSPITAL_COMMUNITY): Payer: BC Managed Care – PPO

## 2011-12-24 ENCOUNTER — Encounter (HOSPITAL_COMMUNITY): Payer: BC Managed Care – PPO

## 2011-12-25 ENCOUNTER — Encounter (HOSPITAL_COMMUNITY): Payer: BC Managed Care – PPO

## 2011-12-30 ENCOUNTER — Encounter (HOSPITAL_COMMUNITY): Payer: BC Managed Care – PPO

## 2011-12-31 ENCOUNTER — Encounter (HOSPITAL_COMMUNITY): Payer: BC Managed Care – PPO

## 2012-01-01 ENCOUNTER — Encounter (HOSPITAL_COMMUNITY): Payer: BC Managed Care – PPO

## 2012-01-06 ENCOUNTER — Encounter (HOSPITAL_COMMUNITY): Payer: BC Managed Care – PPO

## 2012-01-06 ENCOUNTER — Ambulatory Visit: Payer: Self-pay | Admitting: Cardiology

## 2012-01-07 ENCOUNTER — Encounter (HOSPITAL_COMMUNITY): Payer: BC Managed Care – PPO

## 2012-01-08 ENCOUNTER — Encounter (HOSPITAL_COMMUNITY): Payer: BC Managed Care – PPO

## 2012-01-13 ENCOUNTER — Encounter (HOSPITAL_COMMUNITY): Payer: BC Managed Care – PPO

## 2012-01-14 ENCOUNTER — Encounter (HOSPITAL_COMMUNITY): Payer: BC Managed Care – PPO

## 2012-01-15 ENCOUNTER — Encounter (HOSPITAL_COMMUNITY): Payer: BC Managed Care – PPO

## 2012-01-20 ENCOUNTER — Encounter (HOSPITAL_COMMUNITY): Payer: BC Managed Care – PPO

## 2012-01-21 ENCOUNTER — Encounter (HOSPITAL_COMMUNITY): Payer: BC Managed Care – PPO

## 2012-01-27 ENCOUNTER — Encounter (HOSPITAL_COMMUNITY): Payer: BC Managed Care – PPO

## 2012-01-28 ENCOUNTER — Encounter (HOSPITAL_COMMUNITY): Payer: BC Managed Care – PPO

## 2012-01-28 ENCOUNTER — Ambulatory Visit: Payer: Self-pay | Admitting: Cardiology

## 2012-01-29 ENCOUNTER — Encounter (HOSPITAL_COMMUNITY): Payer: BC Managed Care – PPO

## 2012-02-03 ENCOUNTER — Encounter (HOSPITAL_COMMUNITY): Payer: BC Managed Care – PPO

## 2012-02-04 ENCOUNTER — Encounter (HOSPITAL_COMMUNITY): Payer: BC Managed Care – PPO

## 2012-02-05 ENCOUNTER — Encounter (HOSPITAL_COMMUNITY): Payer: BC Managed Care – PPO

## 2012-02-10 ENCOUNTER — Encounter (HOSPITAL_COMMUNITY): Payer: BC Managed Care – PPO

## 2012-02-11 ENCOUNTER — Encounter (HOSPITAL_COMMUNITY): Payer: BC Managed Care – PPO

## 2012-02-12 ENCOUNTER — Encounter (HOSPITAL_COMMUNITY): Payer: BC Managed Care – PPO

## 2012-02-17 ENCOUNTER — Encounter (HOSPITAL_COMMUNITY): Payer: BC Managed Care – PPO

## 2012-02-19 ENCOUNTER — Encounter (HOSPITAL_COMMUNITY): Payer: BC Managed Care – PPO

## 2012-02-20 ENCOUNTER — Other Ambulatory Visit: Payer: Self-pay | Admitting: *Deleted

## 2012-02-20 MED ORDER — SIMVASTATIN 20 MG PO TABS: 20.0000 mg | ORAL_TABLET | Freq: Every day | ORAL | Status: DC

## 2012-02-24 ENCOUNTER — Encounter (HOSPITAL_COMMUNITY): Payer: BC Managed Care – PPO

## 2012-02-26 ENCOUNTER — Encounter (HOSPITAL_COMMUNITY): Payer: BC Managed Care – PPO

## 2012-03-02 ENCOUNTER — Ambulatory Visit: Payer: Self-pay | Admitting: Cardiology

## 2012-03-02 ENCOUNTER — Encounter (HOSPITAL_COMMUNITY): Payer: BC Managed Care – PPO

## 2012-03-03 ENCOUNTER — Encounter (HOSPITAL_COMMUNITY): Payer: BC Managed Care – PPO

## 2012-03-04 ENCOUNTER — Encounter (HOSPITAL_COMMUNITY): Payer: BC Managed Care – PPO

## 2012-03-05 ENCOUNTER — Other Ambulatory Visit: Payer: Self-pay

## 2012-03-05 MED ORDER — AMLODIPINE BESYLATE 5 MG PO TABS: 5.0000 mg | ORAL_TABLET | Freq: Every day | ORAL | Status: DC

## 2012-03-09 ENCOUNTER — Encounter (HOSPITAL_COMMUNITY): Payer: BC Managed Care – PPO

## 2012-03-10 ENCOUNTER — Encounter (HOSPITAL_COMMUNITY): Payer: BC Managed Care – PPO

## 2012-03-11 ENCOUNTER — Encounter (HOSPITAL_COMMUNITY): Payer: BC Managed Care – PPO

## 2012-03-16 ENCOUNTER — Encounter (HOSPITAL_COMMUNITY): Payer: BC Managed Care – PPO

## 2012-03-17 ENCOUNTER — Encounter (HOSPITAL_COMMUNITY): Payer: BC Managed Care – PPO

## 2012-03-18 ENCOUNTER — Encounter (HOSPITAL_COMMUNITY): Payer: BC Managed Care – PPO

## 2012-03-23 ENCOUNTER — Encounter (HOSPITAL_COMMUNITY): Payer: BC Managed Care – PPO

## 2012-03-24 ENCOUNTER — Encounter (HOSPITAL_COMMUNITY): Payer: BC Managed Care – PPO

## 2012-03-25 ENCOUNTER — Encounter (HOSPITAL_COMMUNITY): Payer: BC Managed Care – PPO

## 2012-04-12 ENCOUNTER — Ambulatory Visit: Payer: Self-pay | Admitting: Cardiology

## 2012-04-14 ENCOUNTER — Other Ambulatory Visit: Payer: Self-pay | Admitting: *Deleted

## 2012-05-17 ENCOUNTER — Telehealth: Payer: Self-pay | Admitting: Cardiology

## 2012-05-17 NOTE — Telephone Encounter (Deleted)
Missed last appt

## 2012-06-09 ENCOUNTER — Encounter: Payer: Self-pay | Admitting: Cardiology

## 2012-06-09 ENCOUNTER — Ambulatory Visit (INDEPENDENT_AMBULATORY_CARE_PROVIDER_SITE_OTHER): Payer: BC Managed Care – PPO | Admitting: Cardiology

## 2012-06-09 VITALS — BP 126/100 | HR 60 | Ht 67.5 in | Wt 269.0 lb

## 2012-06-09 DIAGNOSIS — I2542 Coronary artery dissection: Secondary | ICD-10-CM

## 2012-06-09 DIAGNOSIS — R079 Chest pain, unspecified: Secondary | ICD-10-CM

## 2012-06-09 DIAGNOSIS — E785 Hyperlipidemia, unspecified: Secondary | ICD-10-CM

## 2012-06-09 DIAGNOSIS — I10 Essential (primary) hypertension: Secondary | ICD-10-CM

## 2012-06-09 NOTE — Patient Instructions (Addendum)
STOP SIMVASTATIN  FASTING LIPID AND LIVER PANEL TO BE DONE IN 6 WEEKS 07/23/12  PLEASE FOLLOW UP WITH DR. ROSS IN 3 MONTHS  PLEASE CALL us IF YOU DECIDE TO HAVE THE CAROTID DUPLEX DONE  PLEASE CALL WITH YOUR BP READINGS FOR THE NEXT 3 WEEKS PER DR. Riley Kill [phone]

## 2012-06-09 NOTE — Assessment & Plan Note (Signed)
The patient experienced a spontaneous coronary dissection, and this involved a moderate size left anterior descending artery. She underwent revascularization surgery, and she is doing quite well. She's had no new symptoms, has a normal electrocardiogram, and her followup echo revealed normal left ventricular function. Based upon the spontaneous coronary dissection, we will likely stop her statins at the present time. We will measure her lipid profile to make sure she does not have another indication for taking lipid lowering agents. We will also consider whether or not to perform a carotid Doppler study to exclude fibromuscular dysplasia as possible covariant in the presentation of SCAD.  He spent a lot of time discussing the manifestations, the potential for recurrence, relationship to fibromuscular dysplasia, and the unclear all statins for long-term management. Equally important, she needs to do a better job of taking care of herself.

## 2012-06-09 NOTE — Assessment & Plan Note (Signed)
She previously had borderline elevation of her LDL. Her LDL came down on statin therapy. Her need for statins however is unclear. Whether there's an adverse effect of statins in the setting of SCAD is really unknown at this time.

## 2012-06-09 NOTE — Assessment & Plan Note (Signed)
This very nice lady will need better control. Part of it is related to her weight, and weight reduction would be particularly helpful in reducing her blood pressure. It does not come down, and she is to measure regularly, and consideration of increased dosing with additional agents and/or increasing the current dose of medication would be considered. She will be seeing Dr. Tenny Craw in followup approximately 3 months

## 2012-06-09 NOTE — Assessment & Plan Note (Signed)
The patient's symptoms are certainly atypical. There no exertion-related, she says are clearly unlike what she had before, and she is fairly point tenderness on the right side of the parasternal area. I suspect this was musculoskeletal, and she will keep an eye on this.

## 2012-06-09 NOTE — Progress Notes (Signed)
HPI:  This nice lady returns today in follow up.  She has done really well, and returned to work.  She has had returned without difficulty.  She has noted recently a little sticking in the R parasternal area that is brief, not associated with SOB, and nothing similar to what she had at the presentation time of SCAD.  I went through all of this in detail with her again today, including the etiology, what is known, and the approach.  She seems to be getting along well, although she has not been able to reduce her weight, and her BP has been creeping up a bit over time.     Current Outpatient Prescriptions  Medication Sig Dispense Refill  . amLODipine (NORVASC) 5 MG tablet Take 1 tablet (5 mg total) by mouth daily.  30 tablet  0  . aspirin EC 81 MG tablet Take 1 tablet (81 mg total) by mouth daily.      . famotidine (PEPCID) 20 MG tablet Take 20 mg by mouth 2 (two) times daily as needed. Indigestion      . ferrous sulfate 325 (65 FE) MG tablet Take 325 mg by mouth daily with breakfast.      . metoprolol tartrate (LOPRESSOR) 25 MG tablet TAKE 1 1/2 TABLETS TWICE A DAY  270 tablet  4  . Multiple Vitamin (MULITIVITAMIN WITH MINERALS) TABS Take 1 tablet by mouth daily.       No current facility-administered medications for this visit.    Allergies  Allergen Reactions  . Morphine And Related Rash  . Penicillins Rash    Past Medical History  Diagnosis Date  . Hypertension   . Iron deficiency anemia   . Obesity   . Coronary artery dissection     NSTEMI 8/12: cath with mid to dist LAD spont. dissection, unable to do PCI and referred for CABG:  SVG to LAD (Dr. PVT)  . Dyslipidemia   . Coronary artery dissection 10/24/2010  . Angina   . Asthma     "as a child"    Past Surgical History  Procedure Laterality Date  . Coronary artery bypass graft  10/24/2010    CABG X1:  coronary artery dissection -->CABG:  SVG to LAD (Dr. PVT)  . Laparoscopic tubal ligation  07/01/2011    Procedure:  LAPAROSCOPIC TUBAL LIGATION;  Surgeon: Tilda Burrow, MD;  Location: [facility];  Service: Gynecology;  Laterality: Bilateral;  Laparoscopic Bilateral Tubal Sterilization with Fallope Rings; ended at 1143  . Umbilical hernia repair  07/01/2011    Procedure: HERNIA REPAIR UMBILICAL ADULT;  Surgeon: Tilda Burrow, MD;  Location: [facility];  Service: Gynecology;  Laterality: N/A;  during Laparoscopic Bilateral Tubal Sterilization  . Cervical polypectomy  07/01/2011    Procedure: CERVICAL POLYPECTOMY;  Surgeon: Tilda Burrow, MD;  Location: [facility];  Service: Gynecology;  Laterality: N/A;  Endometrial Polypectomy    Family History  Problem Relation Age of Onset  . Heart attack Sister     History   Social History  . Marital Status: Married    Spouse Name: N/A    Number of Children: N/A  . Years of Education: N/A   Occupational History  . Not on file.   Social History Main Topics  . Smoking status: Never Smoker   . Smokeless tobacco: Never Used  . Alcohol Use: No  . Drug Use: No  . Sexually Active: Not Currently   Other Topics Concern  . Not on file  Social History Narrative  . No narrative on file    ROS: Please see the HPI.  All other systems reviewed and negative.  PHYSICAL EXAM:  BP 126/100  Pulse 60  Ht 5' 7.5" (1.715 m)  Wt 269 lb (122.018 kg)  BMI 41.49 kg/m2  SpO2 97%  BP rechecked by me--no change.    General: Well developed, well nourished, in no acute distress. Head:  Normocephalic and atraumatic. Neck: no JVD.  No carotid bruits. Chest:  Slight focal tenderness without erythema, to r of sternum, no wires or instability noted.     Lungs: Clear to auscultation and percussion. Heart: Normal S1 and S2.  No murmur, rubs or gallops.  Pulses: Pulses normal in all 4 extremities. Extremities: No clubbing or cyanosis. No edema. Neurologic: Alert and oriented x 3.  EKG:  NSR.  WNL.   ASSESSMENT AND PLAN:  1.  Discussed SCAD 2.  Stop simvastatin---recent [facility] series---check lipids in 4-6 weeks 3.  Consider dopplers of the carotids----to exclude obvious FMD.   4.  Weight loss and frequent monitor of BP -- increase dose if not controlled.  5.  Discussed potential recurrence for SCAD.  6.  RTC to see Dr. Dietrich Pates

## 2012-07-23 ENCOUNTER — Other Ambulatory Visit: Payer: BC Managed Care – PPO

## 2012-07-30 ENCOUNTER — Telehealth: Payer: Self-pay | Admitting: Internal Medicine

## 2012-07-30 NOTE — Telephone Encounter (Signed)
New problem    Pt calling in her b/p--pts phone hung up before she finished message

## 2012-08-02 ENCOUNTER — Other Ambulatory Visit: Payer: BC Managed Care – PPO

## 2012-08-02 NOTE — Telephone Encounter (Signed)
LMTCB

## 2012-08-04 ENCOUNTER — Other Ambulatory Visit (INDEPENDENT_AMBULATORY_CARE_PROVIDER_SITE_OTHER): Payer: BC Managed Care – PPO

## 2012-08-04 DIAGNOSIS — E785 Hyperlipidemia, unspecified: Secondary | ICD-10-CM

## 2012-08-04 DIAGNOSIS — I10 Essential (primary) hypertension: Secondary | ICD-10-CM

## 2012-08-04 LAB — LIPID PANEL
Cholesterol: 185 mg/dL (ref 0–200)
Total CHOL/HDL Ratio: 4
Triglycerides: 175 mg/dL — ABNORMAL HIGH (ref 0.0–149.0)

## 2012-08-04 LAB — HEPATIC FUNCTION PANEL
AST: 17 U/L (ref 0–37)
Albumin: 3.5 g/dL (ref 3.5–5.2)
Alkaline Phosphatase: 50 U/L (ref 39–117)
Bilirubin, Direct: 0 mg/dL (ref 0.0–0.3)
Total Protein: 6.8 g/dL (ref 6.0–8.3)

## 2012-08-10 NOTE — Telephone Encounter (Signed)
Pt aware of results 

## 2012-08-10 NOTE — Telephone Encounter (Signed)
Follow Up  Pt is calling about her lab results. She asked if you could give her a call back.

## 2012-08-11 ENCOUNTER — Telehealth: Payer: Self-pay | Admitting: *Deleted

## 2012-08-11 NOTE — Telephone Encounter (Signed)
Message copied by Tarri Fuller on Wed Aug 11, 2012  9:18 AM ------      Message from: Dietrich Pates V      Created: Sun Aug 08, 2012 10:58 PM       LDL is 105.  Keep off of statin      Dr. Riley Kill did did discuss carotid dopplers to evaluate other vessels       I would schedule at her convenience.  Will help guide thinking for lipids ------

## 2012-08-11 NOTE — Telephone Encounter (Signed)
lmptcb x 2 go over lab results and to advise to stop statin and also to schedule carotids, see result notes from scott weaver, pac

## 2012-08-13 ENCOUNTER — Encounter: Payer: Self-pay | Admitting: *Deleted

## 2012-08-13 ENCOUNTER — Telehealth: Payer: Self-pay | Admitting: Physician Assistant

## 2012-08-13 DIAGNOSIS — I7771 Dissection of carotid artery: Secondary | ICD-10-CM

## 2012-08-13 NOTE — Telephone Encounter (Signed)
Patient given results of labs and informed that she will be getting a letter in the mail regarding the need for her to have a Carotid study. Provided information to the patient regarding this procedure. She would like to have lab results mail to her with a comparison from last year. Routed to Danielle Rankin for consideration. Also encouraged patient to access MyChart so that she can review her results at any time. She stated she would consider it for the future. Patient would like to have Carotid study done on a Monday, Tuesday or Wednesday. Ordered and routed appropriately to be scheduled. Patient stated current BP is 140/90. She has been off the Statin since April (as ordered by MD at that time).

## 2012-08-13 NOTE — Telephone Encounter (Signed)
Follow-up:    Patient called in returning Carol's call regarding her recent labs.  Please call back.

## 2012-08-13 NOTE — Telephone Encounter (Signed)
lmptcb x 3 to go over recommendations, I will send out a letter today

## 2012-08-16 ENCOUNTER — Telehealth: Payer: Self-pay | Admitting: *Deleted

## 2012-08-16 NOTE — Telephone Encounter (Signed)
lmom lab results mailed to pt today per pt request, pt scheduled for carotids 08/18/12 @ 11 am.

## 2012-08-17 NOTE — Telephone Encounter (Signed)
Carotid scheduled for 08/18/12 @ 11am.

## 2012-08-18 ENCOUNTER — Encounter (INDEPENDENT_AMBULATORY_CARE_PROVIDER_SITE_OTHER): Payer: BC Managed Care – PPO

## 2012-08-18 DIAGNOSIS — I7771 Dissection of carotid artery: Secondary | ICD-10-CM

## 2012-08-18 DIAGNOSIS — R42 Dizziness and giddiness: Secondary | ICD-10-CM

## 2012-08-20 ENCOUNTER — Encounter: Payer: Self-pay | Admitting: *Deleted

## 2012-08-23 ENCOUNTER — Encounter: Payer: Self-pay | Admitting: Physician Assistant

## 2012-08-23 ENCOUNTER — Telehealth: Payer: Self-pay | Admitting: *Deleted

## 2012-08-23 ENCOUNTER — Other Ambulatory Visit: Payer: Self-pay | Admitting: Obstetrics and Gynecology

## 2012-08-23 NOTE — Telephone Encounter (Signed)
pt notified about carotid doppler results with verbal understanding today

## 2012-08-23 NOTE — Telephone Encounter (Signed)
Message copied by Tarri Fuller on Mon Aug 23, 2012  5:26 PM ------      Message from: [place], [place] T      Created: Mon Aug 23, 2012  5:15 PM       No ICA stenosis on dopplers      Tereso Newcomer, [place]        08/23/2012 5:15 PM ------

## 2012-09-07 ENCOUNTER — Encounter: Payer: BC Managed Care – PPO | Admitting: Internal Medicine

## 2012-09-10 ENCOUNTER — Encounter: Payer: Self-pay | Admitting: *Deleted

## 2012-09-10 ENCOUNTER — Telehealth: Payer: Self-pay | Admitting: *Deleted

## 2012-09-10 ENCOUNTER — Telehealth: Payer: Self-pay | Admitting: Internal Medicine

## 2012-09-10 MED ORDER — AMLODIPINE BESYLATE 5 MG PO TABS: 5.0000 mg | ORAL_TABLET | Freq: Every day | ORAL | Status: DC

## 2012-09-10 NOTE — Telephone Encounter (Signed)
Left message to call back  

## 2012-09-10 NOTE — Telephone Encounter (Deleted)
error 

## 2012-09-10 NOTE — Telephone Encounter (Signed)
Pt comes in to the [place] office today b/c she has been experiencing a headache at work and her blood pressure was 150/100. BP was 126/100 in April ov with Dr. Riley Kill.  Pt thought she was supposed to stop her Amlodipine & has not been on it. States her blood pressure has been rising. She  Will restart her Amlodipine as previously directed by Dr. Riley Kill. Refill called in Pt also given a copy of her current cholesterol labs  She will keep a blood pressure log & call back with readings. She is taking tylenol for the headache. She is not having any visual disturbances or dizziness. Reassurance given.  Mylo Red RN

## 2012-09-10 NOTE — Telephone Encounter (Signed)
New Prob  Pt husband states they are concerned about her BP. He believes that it is high.  Wants to know what they can do.

## 2012-09-21 NOTE — Telephone Encounter (Signed)
Barrie Folk, RN at 09/10/2012 1:41 PM   Status: Signed            Pt comes in to the [place] office today b/c she has been experiencing a headache at work and her blood pressure was 150/100.  BP was 126/100 in April ov with Dr. Riley Kill.  Pt thought she was supposed to stop her Amlodipine & has not been on it. States her blood pressure has been rising.  She Will restart her Amlodipine as previously directed by Dr. Riley Kill.  Refill called in  Pt also given a copy of her current cholesterol labs  She will keep a blood pressure log & call back with readings. She is taking tylenol for the headache. She is not having any visual disturbances or dizziness.  Reassurance given.  Mylo Red RN

## 2012-09-23 ENCOUNTER — Encounter: Payer: Self-pay | Admitting: Internal Medicine

## 2012-10-04 ENCOUNTER — Ambulatory Visit: Payer: BC Managed Care – PPO | Admitting: Internal Medicine

## 2012-10-11 ENCOUNTER — Ambulatory Visit (INDEPENDENT_AMBULATORY_CARE_PROVIDER_SITE_OTHER): Payer: BC Managed Care – PPO | Admitting: Obstetrics and Gynecology

## 2012-10-11 ENCOUNTER — Other Ambulatory Visit (HOSPITAL_COMMUNITY)
Admission: RE | Admit: 2012-10-11 | Discharge: 2012-10-11 | Disposition: A | Discharge status: Home / Self Care | Payer: BC Managed Care – PPO | Source: Ambulatory Visit | Attending: Obstetrics and Gynecology | Admitting: Obstetrics and Gynecology

## 2012-10-11 ENCOUNTER — Encounter: Payer: Self-pay | Admitting: Obstetrics and Gynecology

## 2012-10-11 VITALS — BP 148/100 | Ht 66.5 in | Wt 271.0 lb

## 2012-10-11 DIAGNOSIS — Z1212 Encounter for screening for malignant neoplasm of rectum: Secondary | ICD-10-CM

## 2012-10-11 DIAGNOSIS — Z01419 Encounter for gynecological examination (general) (routine) without abnormal findings: Secondary | ICD-10-CM

## 2012-10-11 DIAGNOSIS — Z1151 Encounter for screening for human papillomavirus (HPV): Secondary | ICD-10-CM | POA: Insufficient documentation

## 2012-10-11 LAB — HEMOCCULT GUIAC POC 1CARD (OFFICE): Fecal Occult Blood, POC: NEGATIVE

## 2012-10-11 NOTE — Addendum Note (Signed)
Addended by: Criss Alvine on: 10/11/2012 11:40 AM   Modules accepted: Orders

## 2012-10-11 NOTE — Progress Notes (Signed)
  Assessment:  Annual Gyn Exam S/p endo ablation with partial reduction of menses Uterine fibroids S/p CABG   Plan:  1. pap smear done, next pap due 20yr 2. return annually or prn 3    Annual mammogram advised Subjective:  Ashlee Stein is a 47 y.o. female No obstetric history on file. who presents for annual exam. Patient's last menstrual period was 10/01/2012.x3 days The patient has complaints today of none. Had neg mammogram.  The following portions of the patient's history were reviewed and updated as appropriate: allergies, current medications, past family history, past medical history, past social history, past surgical history and problem list.  Review of Systems Constitutional: negative Gastrointestinal: negative Genitourinary:   Objective:  BP 148/100  Ht 5' 6.5" (1.689 m)  Wt 271 lb (122.925 kg)  BMI 43.09 kg/m2  LMP 10/01/2012   BMI: Body mass index is 43.09 kg/(m^2).  General Appearance: Alert, appropriate appearance for age. No acute distress HEENT: Grossly normal Neck / Thyroid: normal , no thyromegaly Cardiovascular: RRR; normal S1, S2, no murmur Lungs: CTA bilaterally Back: No CVAT Breast Exam: No dimpling, nipple retraction or discharge. No masses or nodes. and No masses or nodes.No dimpling, nipple retraction or discharge. Gastrointestinal: Soft, non-tender, no masses or organomegaly Pelvic Exam: External genitalia: normal general appearance Vaginal: normal mucosa without prolapse or lesions Cervix: normal appearance Adnexa: normal bimanual exam Uterus: enlarged 10 wk Rectovaginal: normal rectal, no masses and guaiac negative stool obtained Lymphatic Exam: Non-palpable nodes in neck, clavicular, axillary, or inguinal regions Skin: no rash or abnormalities Neurologic: Normal gait and speech, no tremor  Psychiatric: Alert and oriented, appropriate affect.  Urinalysis:normal and Not done  Christin Bach. MD Pgr [phone] 9:03 AM

## 2012-11-01 ENCOUNTER — Ambulatory Visit (INDEPENDENT_AMBULATORY_CARE_PROVIDER_SITE_OTHER): Payer: BC Managed Care – PPO | Admitting: Nurse Practitioner

## 2012-11-01 ENCOUNTER — Encounter: Payer: Self-pay | Admitting: Nurse Practitioner

## 2012-11-01 VITALS — BP 130/90 | HR 68 | Ht 67.5 in | Wt 272.0 lb

## 2012-11-01 DIAGNOSIS — I2510 Atherosclerotic heart disease of native coronary artery without angina pectoris: Secondary | ICD-10-CM

## 2012-11-01 DIAGNOSIS — E785 Hyperlipidemia, unspecified: Secondary | ICD-10-CM

## 2012-11-01 LAB — LIPID PANEL
Cholesterol: 202 mg/dL — ABNORMAL HIGH (ref 0–200)
HDL: 47 mg/dL (ref >39.00)
Total CHOL/HDL Ratio: 4
Triglycerides: 165 mg/dL — ABNORMAL HIGH (ref 0.0–149.0)
VLDL: 33 mg/dL (ref 0.0–40.0)

## 2012-11-01 LAB — HEPATIC FUNCTION PANEL
ALT: 20 U/L (ref 0–35)
AST: 19 U/L (ref 0–37)
Albumin: 3.5 g/dL (ref 3.5–5.2)
Alkaline Phosphatase: 46 U/L (ref 39–117)
Bilirubin, Direct: 0.1 mg/dL (ref 0.0–0.3)
Total Bilirubin: 0.6 mg/dL (ref 0.3–1.2)
Total Protein: 6.5 g/dL (ref 6.0–8.3)

## 2012-11-01 LAB — LDL CHOLESTEROL, DIRECT: Direct LDL: 125.9 mg/dL

## 2012-11-01 LAB — BASIC METABOLIC PANEL
BUN: 12 mg/dL (ref 6–23)
CO2: 27 mEq/L (ref 19–32)
Calcium: 8.6 mg/dL (ref 8.4–10.5)
Chloride: 106 mEq/L (ref 96–112)
Creatinine, Ser: 0.9 mg/dL (ref 0.4–1.2)
GFR: 82.17 mL/min (ref >60.00)
Glucose, Bld: 92 mg/dL (ref 70–99)
Potassium: 3.4 mEq/L — ABNORMAL LOW (ref 3.5–5.1)
Sodium: 139 mEq/L (ref 135–145)

## 2012-11-01 MED ORDER — AMLODIPINE BESYLATE 5 MG PO TABS: 5.0000 mg | ORAL_TABLET | Freq: Every day | ORAL | Status: DC

## 2012-11-01 MED ORDER — METOPROLOL TARTRATE 25 MG PO TABS: ORAL_TABLET | ORAL | Status: DC

## 2012-11-01 NOTE — Progress Notes (Signed)
Ashlee Stein Date of Birth: 08-05-1965 Medical Record #[id number]  History of Present Illness: [name] is seen back today for a follow up visit. Seen for Dr. Tenny Craw. Former patient of Dr. Rosalyn Charters. Has had past NSTEMI in 2012 due to spontaneous dissection of the distal LAD - unable to do PCI and underwent CABG with SVG to the LAD per Dr. Maren Beach back in  2012. Other issues include obesity, GERD, and HTN. Her statin was stopped by Dr. Riley Kill at her last visit. She has had normal EKG and has normal LV function on echo.   Last seen here in April of 2014 - to establish with Dr. Tenny Craw.   Comes back today. Here alone. Doing ok. No chest pain. Not short of breath. Not exercising but seems motivated to start making changes. Needs her medicines refilled. BP at home has been ok since restarting the Norvasc back in July. She is off of her Zocor. Fasting today. She will note some chest discomfort if she is lifting too much weight or with movement - no exertional symptoms whatsoever.   Current Outpatient Prescriptions  Medication Sig Dispense Refill  . amLODipine (NORVASC) 5 MG tablet Take 1 tablet (5 mg total) by mouth daily.  90 tablet  1  . aspirin EC 81 MG tablet Take 1 tablet (81 mg total) by mouth daily.      . famotidine (PEPCID) 20 MG tablet Take 20 mg by mouth 2 (two) times daily as needed. Indigestion      . metoprolol tartrate (LOPRESSOR) 25 MG tablet TAKE 1 1/2 TABLETS TWICE A DAY  270 tablet  4  . Multiple Vitamin (MULITIVITAMIN WITH MINERALS) TABS Take 1 tablet by mouth daily.       No current facility-administered medications for this visit.    Allergies  Allergen Reactions  . Morphine And Related Rash  . Penicillins Rash    Past Medical History  Diagnosis Date  . Hypertension   . Iron deficiency anemia   . Obesity   . Coronary artery dissection     NSTEMI 8/12: cath with mid to dist LAD spont. dissection, unable to do PCI and referred for CABG:  SVG to LAD (Dr. PVT);   Carotid U/S 6/14:  Normal carotid arteries; FMD could not be ruled out  . Dyslipidemia   . Coronary artery dissection 10/24/2010  . Angina   . Asthma     "as a child"  . Heart disease     Past Surgical History  Procedure Laterality Date  . Coronary artery bypass graft  10/24/2010    CABG X1:  coronary artery dissection -->CABG:  SVG to LAD (Dr. PVT)  . Laparoscopic tubal ligation  07/01/2011    Procedure: LAPAROSCOPIC TUBAL LIGATION;  Surgeon: Tilda Burrow, MD;  Location: [facility];  Service: Gynecology;  Laterality: Bilateral;  Laparoscopic Bilateral Tubal Sterilization with Fallope Rings; ended at 1143  . Umbilical hernia repair  07/01/2011    Procedure: HERNIA REPAIR UMBILICAL ADULT;  Surgeon: Tilda Burrow, MD;  Location: [facility];  Service: Gynecology;  Laterality: N/A;  during Laparoscopic Bilateral Tubal Sterilization  . Cervical polypectomy  07/01/2011    Procedure: CERVICAL POLYPECTOMY;  Surgeon: Tilda Burrow, MD;  Location: [facility];  Service: Gynecology;  Laterality: N/A;  Endometrial Polypectomy  . Dilation and curettage of uterus    . Cardiac surgery      open heart surgery  . Hysteroscopy w/ endometrial ablation      History  Smoking status  . Never Smoker   Smokeless tobacco  . Never Used    History  Alcohol Use No    Family History  Problem Relation Age of Onset  . Heart attack Sister   . Diabetes Other     Review of Systems: The review of systems is per the HPI.  All other systems were reviewed and are negative.  Physical Exam: Ht 5' 7.5" (1.715 m)  Wt 272 lb (123.378 kg)  BMI 41.95 kg/m2  LMP 10/01/2012 Patient is very pleasant and in no acute distress. She is obese. Weight is up 3 pounds. Skin is warm and dry. Color is normal.  HEENT is unremarkable. Normocephalic/atraumatic. PERRL. Sclera are nonicteric. Neck is supple. No masses. No JVD. Lungs are clear. Cardiac exam shows a regular rate and rhythm. Abdomen is soft. Extremities are without edema. Gait  and ROM are intact. No gross neurologic deficits noted.  LABORATORY DATA:  Lab Results  Component Value Date   WBC 4.9 06/20/2011   HGB 11.2* 06/20/2011   HCT 33.7* 06/20/2011   PLT 281 06/20/2011   GLUCOSE 100* 06/20/2011   CHOL 185 08/04/2012   TRIG 175.0* 08/04/2012   HDL 45.40 08/04/2012   LDLCALC 105* 08/04/2012   ALT 19 08/04/2012   AST 17 08/04/2012   NA 138 06/20/2011   K 4.0 06/20/2011   CL 104 06/20/2011   CREATININE 1.01 06/20/2011   BUN 12 06/20/2011   CO2 26 06/20/2011   INR 1.01 03/31/2011   HGBA1C 5.4 10/25/2010     Assessment / Plan: 1. Spontaneous coronary dissection - s/p CABG x 1 - doing well clinically. Needs to work on CV risk factor modification with diet/exercise/weight loss.    2. Obesity - discussed in depth today.   3. HTN -better BP control at home. Medicines are refilled today. No changes made.   4. HLD - off of statin therapy - recheck today - I would favor restarting given her risk factors.  Will get her back to see Dr. Tenny Craw in about 4 months.    Patient is agreeable to this plan and will call if any problems develop in the interim.   Rosalio Macadamia, RN, ANP-C [facility] [address] [place], [place]  [address]

## 2012-11-01 NOTE — Patient Instructions (Addendum)
Stay on your current medicines  We will check labs today and decide about your cholesterol medicines  Regular exercise every day is encouraged.   See Dr. Tenny Craw in 4 months with fasting labs  Call the [facility] office at [phone] if you have any questions, problems or concerns.

## 2012-11-06 ENCOUNTER — Other Ambulatory Visit: Payer: Self-pay | Admitting: Cardiology

## 2013-03-07 ENCOUNTER — Ambulatory Visit: Payer: BC Managed Care – PPO | Admitting: Internal Medicine

## 2013-03-14 ENCOUNTER — Ambulatory Visit: Payer: BC Managed Care – PPO | Admitting: Internal Medicine

## 2013-03-15 ENCOUNTER — Encounter: Payer: Self-pay | Admitting: Internal Medicine

## 2013-05-09 ENCOUNTER — Ambulatory Visit: Payer: BC Managed Care – PPO | Admitting: Internal Medicine

## 2013-05-11 ENCOUNTER — Ambulatory Visit: Payer: BC Managed Care – PPO | Admitting: Obstetrics and Gynecology

## 2013-06-17 ENCOUNTER — Encounter: Payer: BC Managed Care – PPO | Admitting: Internal Medicine

## 2013-06-17 NOTE — Progress Notes (Signed)
This encounter was created in error - please disregard.

## 2013-06-21 ENCOUNTER — Encounter: Payer: Self-pay | Admitting: Internal Medicine

## 2013-07-07 ENCOUNTER — Telehealth: Payer: Self-pay | Admitting: Internal Medicine

## 2013-07-07 NOTE — Telephone Encounter (Signed)
New message  Patient is having swelling in Rt leg. She is concerned, she is very concerned. Please call patient today.

## 2013-07-08 NOTE — Telephone Encounter (Signed)
Follow up     Returned a nurses call regarding leg swelling

## 2013-07-08 NOTE — Telephone Encounter (Signed)
Spoke with pt, for about one week now she has had swelling in her knee. It is the right leg that veins were harvested for CABG. If she stands for a long time the knee will become tight  She will have discomfort when walking There is no discoloration and no streaking down the leg. She was referred to her primary care physician Pt agreed with this plan.

## 2013-07-08 NOTE — Telephone Encounter (Signed)
Left message for pt to call.

## 2013-07-27 ENCOUNTER — Ambulatory Visit: Payer: BC Managed Care – PPO | Admitting: Obstetrics and Gynecology

## 2013-08-04 NOTE — Progress Notes (Signed)
HPI:   Ashlee Stein is a 48 yo who was previously followed by Ashlee Stein.  She has a history of SCAD  Underwent CABG.  She was last seen in clinic 1 year ago. SInce seen she has done fairly well.  Notes only occasional sharp chest pains (shocks) not associated with activity. She is active.  No SOB   Still anxious about SCAD, problems in future. Current Outpatient Prescriptions  Medication Sig Dispense Refill  . amLODipine (NORVASC) 5 MG tablet Take 1 tablet (5 mg total) by mouth daily.  90 tablet  3  . aspirin EC 81 MG tablet Take 1 tablet (81 mg total) by mouth daily.      . famotidine (PEPCID) 20 MG tablet Take 20 mg by mouth 2 (two) times daily as needed. Indigestion      . metoprolol tartrate (LOPRESSOR) 25 MG tablet TAKE 1 AND 1/2 TABLETS TWICE A DAY  270 tablet  3  . Multiple Vitamin (MULITIVITAMIN WITH MINERALS) TABS Take 1 tablet by mouth daily.       No current facility-administered medications for this visit.    Allergies  Allergen Reactions  . Morphine And Related Rash  . Penicillins Rash    Past Medical History  Diagnosis Date  . Hypertension   . Iron deficiency anemia   . Obesity   . Coronary artery dissection     NSTEMI 8/12: cath with mid to dist LAD spont. dissection, unable to do PCI and referred for CABG:  SVG to LAD (Dr. PVT);  Carotid U/S 6/14:  Normal carotid arteries; FMD could not be ruled out  . Dyslipidemia   . Coronary artery dissection 10/24/2010  . Angina   . Asthma     "as a child"  . Heart disease     Past Surgical History  Procedure Laterality Date  . Coronary artery bypass graft  10/24/2010    CABG X1:  coronary artery dissection -->CABG:  SVG to LAD (Dr. PVT)  . Laparoscopic tubal ligation  07/01/2011    Procedure: LAPAROSCOPIC TUBAL LIGATION;  Surgeon: Jonnie Kind, MD;  Location: [facility];  Service: Gynecology;  Laterality: Bilateral;  Laparoscopic Bilateral Tubal Sterilization with Fallope Rings; ended at 1143  . Umbilical hernia repair  07/01/2011     Procedure: HERNIA REPAIR UMBILICAL ADULT;  Surgeon: Jonnie Kind, MD;  Location: [facility];  Service: Gynecology;  Laterality: N/A;  during Laparoscopic Bilateral Tubal Sterilization  . Cervical polypectomy  07/01/2011    Procedure: CERVICAL POLYPECTOMY;  Surgeon: Jonnie Kind, MD;  Location: [facility];  Service: Gynecology;  Laterality: N/A;  Endometrial Polypectomy  . Dilation and curettage of uterus    . Cardiac surgery      open heart surgery  . Hysteroscopy w/ endometrial ablation      Family History  Problem Relation Age of Onset  . Heart attack Sister   . Diabetes Other     History   Social History  . Marital Status: Married    Spouse Name: N/A    Number of Children: N/A  . Years of Education: N/A   Occupational History  . Not on file.   Social History Main Topics  . Smoking status: Never Smoker   . Smokeless tobacco: Never Used  . Alcohol Use: No  . Drug Use: No  . Sexual Activity: Yes   Other Topics Concern  . Not on file   Social History Narrative  . No narrative on file    ROS:  Please see the HPI.  All other systems reviewed and negative.  PHYSICAL EXAM:  BP 135/90  Pulse 67  Ht 5' 7.5" (1.715 m)  Wt 269 lb (122.018 kg)  BMI 41.49 kg/m2    General: Well developed, well nourished, in no acute distress. Head:  Normocephalic and atraumatic. Neck: no JVD.  No carotid bruits. Chest:  Slight focal tenderness without erythema, to r of sternum, no wires or instability noted.     Lungs: Clear to auscultation and percussion. Heart: Normal S1 and S2.  No murmur, rubs or gallops.  Pulses: Pulses normal in all 4 extremities. Extremities: No clubbing or cyanosis. No edema. Neurologic: Alert and oriented x 3.  EKG:  NSR.   67 bpm   ASSESSMENT AND PLAN:  1.  SCAD.  Ashlee Stein doing well.  Sharp pains not angina.  More musculoskeletal.  Continue to follow WIll check on SCAD support group. 2.  Lipids  Not on statin due to [place] 3.  HTN  BP is  borderline  Will need to folow She says lower at home  Ashlee Stein to monitor on own  Bring in cuffCome in for BP check.  Discussed wt loss.

## 2013-08-05 ENCOUNTER — Encounter: Payer: Self-pay | Admitting: Internal Medicine

## 2013-08-05 ENCOUNTER — Ambulatory Visit (INDEPENDENT_AMBULATORY_CARE_PROVIDER_SITE_OTHER): Payer: BC Managed Care – PPO | Admitting: Internal Medicine

## 2013-08-05 VITALS — BP 135/90 | HR 67 | Ht 67.5 in | Wt 269.0 lb

## 2013-08-05 DIAGNOSIS — I2542 Coronary artery dissection: Secondary | ICD-10-CM

## 2013-08-05 DIAGNOSIS — E785 Hyperlipidemia, unspecified: Secondary | ICD-10-CM

## 2013-08-05 DIAGNOSIS — I10 Essential (primary) hypertension: Secondary | ICD-10-CM

## 2013-08-05 NOTE — Patient Instructions (Addendum)
Your physician recommends that you schedule a follow-up appointment in: 4 weeks for BP check---bring log with you  Keep log of BP's for 4 weeks and bring to next appointment   Your physician wants you to follow-up in: 12 months with Dr Theressa Stamps will receive a reminder letter in the mail two months in advance. If you don't receive a letter, please call our office to schedule the follow-up appointment.  Your physician recommends that you continue on your current medications as directed. Please refer to the Current Medication list given to you today.

## 2013-08-29 ENCOUNTER — Ambulatory Visit (INDEPENDENT_AMBULATORY_CARE_PROVIDER_SITE_OTHER): Payer: BC Managed Care – PPO | Admitting: Obstetrics and Gynecology

## 2013-08-29 ENCOUNTER — Encounter: Payer: Self-pay | Admitting: Obstetrics and Gynecology

## 2013-08-29 VITALS — BP 132/98 | Ht 67.0 in | Wt 275.0 lb

## 2013-08-29 DIAGNOSIS — IMO0002 Reserved for concepts with insufficient information to code with codable children: Secondary | ICD-10-CM | POA: Diagnosis not present

## 2013-08-29 NOTE — Progress Notes (Signed)
This chart was scribed by Ludger Nutting, Medical Scribe, for Dr. Mallory Shirk on 08/29/13 at 4:46 PM. This chart was reviewed by Dr. Mallory Shirk for accuracy.   [facility]  Patient name: Ashlee Stein MRN [id number]  Date of birth: 05-Jun-1965  CC & HPI:  Ashlee Stein is a 48 y.o. female presenting today for constant, unchanged umbilical swelling and mild associated tenderness that has been present since her laparoscopic surgery 1.5 years ago.   She also reports left sided abdominal pain that is present with intercourse. Patient is concerned that her fibroids have returned.   ROS:  +umbilical swelling +left sided abdominal pain   Pertinent History Reviewed:   Reviewed: Significant for  Medical                                    Surgical Hx:   Umbilical hernia repair, laparoscopic tubal ligation  Medications: Reviewed & Updated - see associated section Social History: Reviewed -  reports that she has never smoked. She has never used smokeless tobacco.  Objective Findings:  Vitals: Blood pressure 132/98, height 5\' 7"  (1.702 m), weight 275 lb (124.739 kg), last menstrual period 08/08/2013.  Physical Examination: General appearance - alert, well appearing, and in no distress and oriented to person, place, and time Mental status - alert, oriented to person, place, and time, normal mood, behavior, speech, dress, motor activity, and thought processes Abdomen - end of a suture tip is palpable below the skin at old umbilical incision site  Pelvic -  VULVA: normal appearing vulva with no masses, tenderness or lesions,  VAGINA: normal appearing vagina with normal color and discharge, no lesions, well-supported, normal vaginal length CERVIX: normal appearing cervix without discharge or lesions,  UTERUS: uterus is larger in size,  8wks, but normal shape, consistency, deviated slightly to the left, anterior ADNEXA: normal adnexa in size, nontender and no masses, no  masses   Assessment & Plan:   A: 1. Dyspareunia secondary to uterine contact and partner size 2. Umbilical suture, tenderness  P: 1. Follow up PRN for increased umbilical plan 2. Discussed position changes to reduce dyspareunia

## 2013-09-12 NOTE — Telephone Encounter (Signed)
Close encounter 

## 2013-10-19 ENCOUNTER — Other Ambulatory Visit: Payer: BC Managed Care – PPO | Admitting: Obstetrics and Gynecology

## 2013-12-02 ENCOUNTER — Other Ambulatory Visit: Payer: Self-pay | Admitting: Internal Medicine

## 2013-12-02 ENCOUNTER — Other Ambulatory Visit: Payer: Self-pay | Admitting: Nurse Practitioner

## 2013-12-23 ENCOUNTER — Ambulatory Visit (INDEPENDENT_AMBULATORY_CARE_PROVIDER_SITE_OTHER): Payer: BC Managed Care – PPO | Admitting: Internal Medicine

## 2013-12-23 ENCOUNTER — Encounter: Payer: Self-pay | Admitting: Internal Medicine

## 2013-12-23 VITALS — BP 120/82 | HR 68 | Ht 67.0 in | Wt 273.0 lb

## 2013-12-23 DIAGNOSIS — I10 Essential (primary) hypertension: Secondary | ICD-10-CM

## 2013-12-23 DIAGNOSIS — I2542 Coronary artery dissection: Secondary | ICD-10-CM

## 2013-12-23 DIAGNOSIS — R0789 Other chest pain: Secondary | ICD-10-CM

## 2013-12-23 DIAGNOSIS — E785 Hyperlipidemia, unspecified: Secondary | ICD-10-CM

## 2013-12-23 NOTE — Patient Instructions (Signed)
Your physician recommends that you continue on your current medications as directed. Please refer to the Current Medication list given to you today. Your physician wants you to follow-up in: 1 year with Dr. Ross.  You will receive a reminder letter in the mail two months in advance. If you don't receive a letter, please call our office to schedule the follow-up appointment.  

## 2013-12-23 NOTE — Progress Notes (Signed)
HPI:   Patient is a 48 yo who was previously followed by T Stuckey.  She has a history of SCAD  Underwent CABG.  She was last seen in clinic 1 year ago.  She continues to have episodes of discomfort in chest  Not associated with acitvity  Shock-like sensations R parasternal area.  Not pleuritic  Getting worse recently.  LIke sensations she had prior to SCAD event.   Current Outpatient Prescriptions  Medication Sig Dispense Refill  . amLODipine (NORVASC) 5 MG tablet TAKE 1 TABLET EVERY DAY  90 tablet  3  . aspirin EC 81 MG tablet Take 1 tablet (81 mg total) by mouth daily.      . famotidine (PEPCID) 20 MG tablet Take 20 mg by mouth 2 (two) times daily as needed. Indigestion      . metoprolol tartrate (LOPRESSOR) 25 MG tablet TAKE 1&1/2 TABLETS BY MOUTH TWICE A DAY  270 tablet  3  . Multiple Vitamin (MULITIVITAMIN WITH MINERALS) TABS Take 1 tablet by mouth daily.       No current facility-administered medications for this visit.    Allergies  Allergen Reactions  . Morphine And Related Rash  . Penicillins Rash    Past Medical History  Diagnosis Date  . Hypertension   . Iron deficiency anemia   . Obesity   . Coronary artery dissection     NSTEMI 8/12: cath with mid to dist LAD spont. dissection, unable to do PCI and referred for CABG:  SVG to LAD (Dr. PVT);  Carotid U/S 6/14:  Normal carotid arteries; FMD could not be ruled out  . Dyslipidemia   . Coronary artery dissection 10/24/2010  . Angina   . Asthma     "as a child"  . Heart disease     Past Surgical History  Procedure Laterality Date  . Coronary artery bypass graft  10/24/2010    CABG X1:  coronary artery dissection -->CABG:  SVG to LAD (Dr. PVT)  . Laparoscopic tubal ligation  07/01/2011    Procedure: LAPAROSCOPIC TUBAL LIGATION;  Surgeon: Jonnie Kind, MD;  Location: [facility];  Service: Gynecology;  Laterality: Bilateral;  Laparoscopic Bilateral Tubal Sterilization with Fallope Rings; ended at 1143  . Umbilical hernia  repair  07/01/2011    Procedure: HERNIA REPAIR UMBILICAL ADULT;  Surgeon: Jonnie Kind, MD;  Location: [facility];  Service: Gynecology;  Laterality: N/A;  during Laparoscopic Bilateral Tubal Sterilization  . Cervical polypectomy  07/01/2011    Procedure: CERVICAL POLYPECTOMY;  Surgeon: Jonnie Kind, MD;  Location: [facility];  Service: Gynecology;  Laterality: N/A;  Endometrial Polypectomy  . Dilation and curettage of uterus    . Cardiac surgery      open heart surgery  . Hysteroscopy w/ endometrial ablation      Family History  Problem Relation Age of Onset  . Heart attack Sister   . Diabetes Other     History   Social History  . Marital Status: Married    Spouse Name: N/A    Number of Children: N/A  . Years of Education: N/A   Occupational History  . Not on file.   Social History Main Topics  . Smoking status: Never Smoker   . Smokeless tobacco: Never Used  . Alcohol Use: No  . Drug Use: No  . Sexual Activity: Yes    Birth Control/ Protection: Surgical   Other Topics Concern  . Not on file   Social History Narrative  .  No narrative on file    ROS: Please see the HPI.  All other systems reviewed and negative.  PHYSICAL EXAM:  BP 120/82  Pulse 68  Ht 5\' 7"  (1.702 m)  Wt 273 lb (123.832 kg)  BMI 42.75 kg/m2  SpO2 98%    General: Well developed, well nourished, in no acute distress. Head:  Normocephalic and atraumatic. Neck: no JVD.  No carotid bruits. Chest: Tender to palpation R mid chest.  Brings on feelings she is having.      Lungs: Clear to auscultation and percussion. Heart: Normal S1 and S2.  No murmur, rubs or gallops.  Pulses: Pulses normal in all 4 extremities. Extremities: No clubbing or cyanosis. No edema. Neurologic: Alert and oriented x 3.  EKG:  NSR.   67 bpm   ASSESSMENT AND PLAN:  1.  SCAD. Patient with intermittent chest discomfort.  Today, I can reproduce discomfort by pressing on her chest.  Appears musculoskeletal  She is convinced  that it is warning of something more  Similar to sensations prior to SCAD event.  . 2.  Lipids  Not on statin due to [place] 3.  HTN  BP is good  Continue meds

## 2014-02-03 ENCOUNTER — Other Ambulatory Visit: Payer: BC Managed Care – PPO | Admitting: Obstetrics and Gynecology

## 2014-02-09 ENCOUNTER — Ambulatory Visit (INDEPENDENT_AMBULATORY_CARE_PROVIDER_SITE_OTHER): Payer: BC Managed Care – PPO | Admitting: Obstetrics and Gynecology

## 2014-02-09 ENCOUNTER — Encounter: Payer: Self-pay | Admitting: Obstetrics and Gynecology

## 2014-02-09 VITALS — BP 128/80 | Ht 67.5 in | Wt 278.0 lb

## 2014-02-09 DIAGNOSIS — Z01419 Encounter for gynecological examination (general) (routine) without abnormal findings: Secondary | ICD-10-CM

## 2014-02-09 DIAGNOSIS — N63 Unspecified lump in unspecified breast: Secondary | ICD-10-CM

## 2014-02-09 NOTE — Patient Instructions (Signed)
Dr. Arnoldo Morale - [phone]

## 2014-02-09 NOTE — Progress Notes (Signed)
Patient ID: Ashlee Stein, female   DOB: 1965/10/08, 48 y.o.   MRN: [id number]  Assessment:  Annual Gyn Exam  Right 1cm breast nodule felt 8 oclock Ventral hernia, s/p prior repair Umbilical sutures , subq, and palpable   Plan:  1. pap smear not done, next pap due 2 years (Q 47yr regimen) 2. return annually or prn 3    Diagnostic mammogram advised rt breast to obtain at [facility] 4.   S/p screening mammo at [facility] with ? In breast Subjective:  Ashlee Stein is a 48 y.o. female No obstetric history on file. who presents for annual exam. Patient's last menstrual period was 01/28/2014. The patient has complaints today of separation of her skin where she had an umbilical hernia repaired.  She had a mammogram done this year at [facility] which was abnormal.  She has a diagnostic mammogram scheduled for this month.  Her last pap was normal with negative HPV last year.    The following portions of the patient's history were reviewed and updated as appropriate: allergies, current medications, past family history, past medical history, past social history, past surgical history and problem list. Past Medical History  Diagnosis Date  . Hypertension   . Iron deficiency anemia   . Obesity   . Coronary artery dissection     NSTEMI 8/12: cath with mid to dist LAD spont. dissection, unable to do PCI and referred for CABG:  SVG to LAD (Dr. PVT);  Carotid U/S 6/14:  Normal carotid arteries; FMD could not be ruled out  . Dyslipidemia   . Coronary artery dissection 10/24/2010  . Angina   . Asthma     "as a child"  . Heart disease     Past Surgical History  Procedure Laterality Date  . Coronary artery bypass graft  10/24/2010    CABG X1:  coronary artery dissection -->CABG:  SVG to LAD (Dr. PVT)  . Laparoscopic tubal ligation  07/01/2011    Procedure: LAPAROSCOPIC TUBAL LIGATION;  Surgeon: Jonnie Kind, MD;  Location: [facility];  Service: Gynecology;  Laterality: Bilateral;  Laparoscopic Bilateral Tubal  Sterilization with Fallope Rings; ended at 1143  . Umbilical hernia repair  07/01/2011    Procedure: HERNIA REPAIR UMBILICAL ADULT;  Surgeon: Jonnie Kind, MD;  Location: [facility];  Service: Gynecology;  Laterality: N/A;  during Laparoscopic Bilateral Tubal Sterilization  . Cervical polypectomy  07/01/2011    Procedure: CERVICAL POLYPECTOMY;  Surgeon: Jonnie Kind, MD;  Location: [facility];  Service: Gynecology;  Laterality: N/A;  Endometrial Polypectomy  . Dilation and curettage of uterus    . Cardiac surgery      open heart surgery  . Hysteroscopy w/ endometrial ablation      Current outpatient prescriptions: amLODipine (NORVASC) 5 MG tablet, TAKE 1 TABLET EVERY DAY, Disp: 90 tablet, Rfl: 3;  aspirin EC 81 MG tablet, Take 1 tablet (81 mg total) by mouth daily., Disp: , Rfl: ;  famotidine (PEPCID) 20 MG tablet, Take 20 mg by mouth 2 (two) times daily as needed. Indigestion, Disp: , Rfl: ;  metoprolol tartrate (LOPRESSOR) 25 MG tablet, TAKE 1&1/2 TABLETS BY MOUTH TWICE A DAY, Disp: 270 tablet, Rfl: 3 Multiple Vitamin (MULITIVITAMIN WITH MINERALS) TABS, Take 1 tablet by mouth daily., Disp: , Rfl:   Review of Systems Constitutional: negative Gastrointestinal: negative Genitourinary: negative  Objective:  BP 128/80 mmHg  Ht 5' 7.5" (1.715 m)  Wt 278 lb (126.1 kg)  BMI 42.87 kg/m2  LMP 01/28/2014  BMI: Body mass index is 42.87 kg/(m^2).  General Appearance: Alert, appropriate appearance for age. No acute distress HEENT: Grossly normal Neck / Thyroid:  Cardiovascular: RRR; normal S1, S2, no murmur Lungs: CTA bilaterally Back: No CVAT Breast Exam: 1cm small, firm area in the right lower, outer breast at 8 o'clock 8 cm from the edge of the nipple in the deep tissue. Gastrointestinal: Soft, non-tender, no masses or organomegaly Pelvic Exam: External genitalia: normal general appearance Vaginal: normal mucosa without prolapse or lesions and normal without tenderness, induration or masses,  good support Cervix: normal appearance Adnexa: normal bimanual exam Uterus: normal single, nontender, fibroids 12 week's size, bladder nontender Rectovaginal: normal rectal, no masses, good support Lymphatic Exam: Non-palpable nodes in neck, clavicular, axillary, or inguinal regions Skin: no rash or abnormalities Neurologic: Normal gait and speech, no tremor  Psychiatric: Alert and oriented, appropriate affect.  Urinalysis:Not done Hemoccult: Negative Mallory Shirk. MD Pgr [phone] 11:53 AM  This chart was scribed for Jonnie Kind, MD by Donato Schultz, ED Scribe. This patient was seen in Room 2 and the patient's care was started at 11:53 AM.

## 2014-02-14 ENCOUNTER — Encounter (HOSPITAL_COMMUNITY): Payer: BC Managed Care – PPO

## 2014-02-20 ENCOUNTER — Other Ambulatory Visit: Payer: Self-pay | Admitting: Obstetrics and Gynecology

## 2014-02-20 DIAGNOSIS — Z1231 Encounter for screening mammogram for malignant neoplasm of breast: Secondary | ICD-10-CM

## 2014-02-21 ENCOUNTER — Ambulatory Visit (HOSPITAL_COMMUNITY)
Admission: RE | Admit: 2014-02-21 | Discharge: 2014-02-21 | Disposition: A | Discharge status: Home / Self Care | Payer: BC Managed Care – PPO | Source: Ambulatory Visit | Attending: Obstetrics and Gynecology | Admitting: Obstetrics and Gynecology

## 2014-02-21 ENCOUNTER — Encounter (HOSPITAL_COMMUNITY): Payer: BC Managed Care – PPO

## 2014-02-21 ENCOUNTER — Other Ambulatory Visit: Payer: Self-pay | Admitting: Obstetrics and Gynecology

## 2014-02-21 DIAGNOSIS — N63 Unspecified lump in unspecified breast: Secondary | ICD-10-CM

## 2014-02-23 ENCOUNTER — Ambulatory Visit (HOSPITAL_COMMUNITY): Payer: BC Managed Care – PPO

## 2014-03-03 ENCOUNTER — Encounter: Payer: Self-pay | Admitting: *Deleted

## 2014-03-03 ENCOUNTER — Ambulatory Visit: Payer: Self-pay | Admitting: Cardiovascular Disease

## 2014-03-04 ENCOUNTER — Emergency Department (HOSPITAL_COMMUNITY): Payer: BLUE CROSS/BLUE SHIELD

## 2014-03-04 ENCOUNTER — Encounter (HOSPITAL_COMMUNITY): Payer: Self-pay | Admitting: Family Medicine

## 2014-03-04 ENCOUNTER — Emergency Department (HOSPITAL_COMMUNITY)
Admission: EM | Admit: 2014-03-04 | Discharge: 2014-03-04 | Disposition: A | Discharge status: Home / Self Care | Payer: BLUE CROSS/BLUE SHIELD | Attending: Emergency Medicine | Admitting: Emergency Medicine

## 2014-03-04 DIAGNOSIS — Z3202 Encounter for pregnancy test, result negative: Secondary | ICD-10-CM | POA: Diagnosis not present

## 2014-03-04 DIAGNOSIS — I209 Angina pectoris, unspecified: Secondary | ICD-10-CM | POA: Insufficient documentation

## 2014-03-04 DIAGNOSIS — I2542 Coronary artery dissection: Secondary | ICD-10-CM | POA: Diagnosis not present

## 2014-03-04 DIAGNOSIS — K420 Umbilical hernia with obstruction, without gangrene: Secondary | ICD-10-CM | POA: Insufficient documentation

## 2014-03-04 DIAGNOSIS — Z7982 Long term (current) use of aspirin: Secondary | ICD-10-CM | POA: Insufficient documentation

## 2014-03-04 DIAGNOSIS — Z79899 Other long term (current) drug therapy: Secondary | ICD-10-CM | POA: Diagnosis not present

## 2014-03-04 DIAGNOSIS — R079 Chest pain, unspecified: Secondary | ICD-10-CM

## 2014-03-04 DIAGNOSIS — J45909 Unspecified asthma, uncomplicated: Secondary | ICD-10-CM | POA: Diagnosis not present

## 2014-03-04 DIAGNOSIS — Z951 Presence of aortocoronary bypass graft: Secondary | ICD-10-CM | POA: Insufficient documentation

## 2014-03-04 DIAGNOSIS — Z880 Allergy status to penicillin: Secondary | ICD-10-CM | POA: Diagnosis not present

## 2014-03-04 DIAGNOSIS — E669 Obesity, unspecified: Secondary | ICD-10-CM | POA: Diagnosis not present

## 2014-03-04 DIAGNOSIS — N83201 Unspecified ovarian cyst, right side: Secondary | ICD-10-CM

## 2014-03-04 DIAGNOSIS — K429 Umbilical hernia without obstruction or gangrene: Secondary | ICD-10-CM

## 2014-03-04 DIAGNOSIS — Z9889 Other specified postprocedural states: Secondary | ICD-10-CM | POA: Diagnosis not present

## 2014-03-04 DIAGNOSIS — I10 Essential (primary) hypertension: Secondary | ICD-10-CM | POA: Diagnosis not present

## 2014-03-04 DIAGNOSIS — Z9851 Tubal ligation status: Secondary | ICD-10-CM | POA: Insufficient documentation

## 2014-03-04 DIAGNOSIS — Z862 Personal history of diseases of the blood and blood-forming organs and certain disorders involving the immune mechanism: Secondary | ICD-10-CM | POA: Insufficient documentation

## 2014-03-04 DIAGNOSIS — R1013 Epigastric pain: Secondary | ICD-10-CM | POA: Diagnosis present

## 2014-03-04 DIAGNOSIS — N8320 Unspecified ovarian cysts: Secondary | ICD-10-CM | POA: Insufficient documentation

## 2014-03-04 DIAGNOSIS — R0789 Other chest pain: Secondary | ICD-10-CM | POA: Diagnosis not present

## 2014-03-04 LAB — CBC WITH DIFFERENTIAL/PLATELET
Basophils Absolute: 0 10*3/uL (ref 0.0–0.1)
Basophils Relative: 1 % (ref 0–1)
EOS ABS: 0.3 10*3/uL (ref 0.0–0.7)
EOS PCT: 7 % — AB (ref 0–5)
HCT: 36.2 % (ref 36.0–46.0)
HEMOGLOBIN: 12.4 g/dL (ref 12.0–15.0)
LYMPHS ABS: 1.7 10*3/uL (ref 0.7–4.0)
Lymphocytes Relative: 39 % (ref 12–46)
MCH: 29.5 pg (ref 26.0–34.0)
MCHC: 34.3 g/dL (ref 30.0–36.0)
MCV: 86.2 fL (ref 78.0–100.0)
Monocytes Absolute: 0.5 10*3/uL (ref 0.1–1.0)
Monocytes Relative: 10 % (ref 3–12)
Neutro Abs: 2 10*3/uL (ref 1.7–7.7)
Neutrophils Relative %: 43 % (ref 43–77)
Platelets: 287 10*3/uL (ref 150–400)
RBC: 4.2 MIL/uL (ref 3.87–5.11)
RDW: 13.3 % (ref 11.5–15.5)
WBC: 4.5 10*3/uL (ref 4.0–10.5)

## 2014-03-04 LAB — LIPASE, BLOOD: LIPASE: 29 U/L (ref 11–59)

## 2014-03-04 LAB — URINALYSIS, ROUTINE W REFLEX MICROSCOPIC
Bilirubin Urine: NEGATIVE
Glucose, UA: NEGATIVE mg/dL
Hgb urine dipstick: NEGATIVE
KETONES UR: NEGATIVE mg/dL
Leukocytes, UA: NEGATIVE
Nitrite: NEGATIVE
Protein, ur: NEGATIVE mg/dL
SPECIFIC GRAVITY, URINE: 1.012 (ref 1.005–1.030)
UROBILINOGEN UA: 0.2 mg/dL (ref 0.0–1.0)
pH: 7.5 (ref 5.0–8.0)

## 2014-03-04 LAB — COMPREHENSIVE METABOLIC PANEL
ALBUMIN: 3.6 g/dL (ref 3.5–5.2)
ALK PHOS: 64 U/L (ref 39–117)
ALT: 17 U/L (ref 0–35)
ANION GAP: 4 — AB (ref 5–15)
AST: 20 U/L (ref 0–37)
BUN: 12 mg/dL (ref 6–23)
CHLORIDE: 105 meq/L (ref 96–112)
CO2: 29 mmol/L (ref 19–32)
Calcium: 8.6 mg/dL (ref 8.4–10.5)
Creatinine, Ser: 1.02 mg/dL (ref 0.50–1.10)
GFR calc Af Amer: 74 mL/min — ABNORMAL LOW (ref >90)
GFR, EST NON AFRICAN AMERICAN: 64 mL/min — AB (ref >90)
Glucose, Bld: 103 mg/dL — ABNORMAL HIGH (ref 70–99)
Potassium: 3.7 mmol/L (ref 3.5–5.1)
SODIUM: 138 mmol/L (ref 135–145)
TOTAL PROTEIN: 6.2 g/dL (ref 6.0–8.3)
Total Bilirubin: 0.3 mg/dL (ref 0.3–1.2)

## 2014-03-04 LAB — WET PREP, GENITAL
Trich, Wet Prep: NONE SEEN
Yeast Wet Prep HPF POC: NONE SEEN

## 2014-03-04 LAB — POC URINE PREG, ED: Preg Test, Ur: NEGATIVE

## 2014-03-04 LAB — I-STAT TROPONIN, ED: Troponin i, poc: 0 ng/mL (ref 0.00–0.08)

## 2014-03-04 MED ORDER — GI COCKTAIL ~~LOC~~
30.0000 mL | Freq: Once | ORAL | Status: AC
  Administered 2014-03-04: 30 mL via ORAL
  Filled 2014-03-04: qty 30

## 2014-03-04 MED ORDER — IOHEXOL 350 MG/ML SOLN
100.0000 mL | Freq: Once | INTRAVENOUS | Status: AC | PRN
  Administered 2014-03-04: 100 mL via INTRAVENOUS

## 2014-03-04 MED ORDER — OMEPRAZOLE 20 MG PO CPDR: 20.0000 mg | DELAYED_RELEASE_CAPSULE | Freq: Two times a day (BID) | ORAL | Status: DC

## 2014-03-04 NOTE — Discharge Instructions (Signed)
Return to the emergency room if you have worsening chest pain, shortness of breath, or other new or worsening symptoms. Recheck with your OB/GYN to schedule follow-up ultrasound regarding the right ovarian cyst. Recheck with your primary care physician to discuss your blood pressure medicines. Your amlodipine may be contributing to your lower extremity swelling. If you have worsening pain around your umbilical hernia oriented is not able to be reduced/pushed back in, return to the emergency room. Discuss surgical referral with your primary care physician if it continues to give you discomfort.  Chest Pain (Nonspecific) It is often hard to give a specific diagnosis for the cause of chest pain. There is always a chance that your pain could be related to something serious, such as a heart attack or a blood clot in the lungs. You need to follow up with your health care provider for further evaluation. CAUSES   Heartburn.  Pneumonia or bronchitis.  Anxiety or stress.  Inflammation around your heart (pericarditis) or lung (pleuritis or pleurisy).  A blood clot in the lung.  A collapsed lung (pneumothorax). It can develop suddenly on its own (spontaneous pneumothorax) or from trauma to the chest.  Shingles infection (herpes zoster virus). The chest wall is composed of bones, muscles, and cartilage. Any of these can be the source of the pain.  The bones can be bruised by injury.  The muscles or cartilage can be strained by coughing or overwork.  The cartilage can be affected by inflammation and become sore (costochondritis). DIAGNOSIS  Lab tests or other studies may be needed to find the cause of your pain. Your health care provider may have you take a test called an ambulatory electrocardiogram (ECG). An ECG records your heartbeat patterns over a 24-hour period. You may also have other tests, such as:  Transthoracic echocardiogram (TTE). During echocardiography, sound waves are used to  evaluate how blood flows through your heart.  Transesophageal echocardiogram (TEE).  Cardiac monitoring. This allows your health care provider to monitor your heart rate and rhythm in real time.  Holter monitor. This is a portable device that records your heartbeat and can help diagnose heart arrhythmias. It allows your health care provider to track your heart activity for several days, if needed.  Stress tests by exercise or by giving medicine that makes the heart beat faster. TREATMENT   Treatment depends on what may be causing your chest pain. Treatment may include:  Acid blockers for heartburn.  Anti-inflammatory medicine.  Pain medicine for inflammatory conditions.  Antibiotics if an infection is present.  You may be advised to change lifestyle habits. This includes stopping smoking and avoiding alcohol, caffeine, and chocolate.  You may be advised to keep your head raised (elevated) when sleeping. This reduces the chance of acid going backward from your stomach into your esophagus. Most of the time, nonspecific chest pain will improve within 2-3 days with rest and mild pain medicine.  HOME CARE INSTRUCTIONS   If antibiotics were prescribed, take them as directed. Finish them even if you start to feel better.  For the next few days, avoid physical activities that bring on chest pain. Continue physical activities as directed.  Do not use any tobacco products, including cigarettes, chewing tobacco, or electronic cigarettes.  Avoid drinking alcohol.  Only take medicine as directed by your health care provider.  Follow your health care provider's suggestions for further testing if your chest pain does not go away.  Keep any follow-up appointments you made. If you do  not go to an appointment, you could develop lasting (chronic) problems with pain. If there is any problem keeping an appointment, call to reschedule. SEEK MEDICAL CARE IF:   Your chest pain does not go away,  even after treatment.  You have a rash with blisters on your chest.  You have a fever. SEEK IMMEDIATE MEDICAL CARE IF:   You have increased chest pain or pain that spreads to your arm, neck, jaw, back, or abdomen.  You have shortness of breath.  You have an increasing cough, or you cough up blood.  You have severe back or abdominal pain.  You feel nauseous or vomit.  You have severe weakness.  You faint.  You have chills. This is an emergency. Do not wait to see if the pain will go away. Get medical help at once. Call your local emergency services (911 in [place] Do not drive yourself to the hospital. MAKE SURE YOU:   Understand these instructions.  Will watch your condition.  Will get help right away if you are not doing well or get worse. Document Released: 11/20/2004 Document Revised: 02/15/2013 Document Reviewed: 09/16/2007 [facility] Patient Information 2015 [place], [place]. This information is not intended to replace advice given to you by your health care provider. Make sure you discuss any questions you have with your health care provider.  Hernia A hernia happens when an organ inside your body pushes out through a weak spot in your belly (abdominal) wall. Most hernias get worse over time. They can often be pushed back into place (reduced). Surgery may be needed to repair hernias that cannot be pushed into place. HOME CARE  Keep doing normal activities.  Avoid lifting more than 10 pounds (4.5 kilograms).  Cough gently and avoid straining. Over time, these things will:  Increase your hernia size.  Irritate your hernia.  Break down hernia repairs.  Stop smoking.  Do not wear anything tight over your hernia. Do not keep the hernia in with an outside bandage.  Eat food that is high in fiber (fruit, vegetables, whole grains).  Drink enough fluids to keep your pee (urine) clear or pale yellow.  Take medicines to make your poop soft (stool softeners) if you  cannot poop (constipated). GET HELP RIGHT AWAY IF:   You have a fever.  You have belly pain that gets worse.  You feel sick to your stomach (nauseous) and throw up (vomit).  Your skin starts to bulge out.  Your hernia turns a different color, feels hard, or is tender.  You have increased pain or puffiness (swelling) around the hernia.  You poop more or less often.  Your poop does not look the way normally does.  You have watery poop (diarrhea).  You cannot push the hernia back in place by applying gentle pressure while lying down. MAKE SURE YOU:   Understand these instructions.  Will watch your condition.  Will get help right away if you are not doing well or get worse. Document Released: 07/31/2009 Document Revised: 05/05/2011 Document Reviewed: 07/31/2009 [facility] Patient Information 2015 [place], [place]. This information is not intended to replace advice given to you by your health care provider. Make sure you discuss any questions you have with your health care provider.  Ovarian Cyst An ovarian cyst is a sac filled with fluid or blood. This sac is attached to the ovary. Some cysts go away on their own. Other cysts need treatment.  HOME CARE   Only take medicine as told by your  doctor.  Follow up with your doctor as told.  Get regular pelvic exams and Pap tests. GET HELP IF:  Your periods are late, not regular, or painful.  You stop having periods.  Your belly (abdominal) or pelvic pain does not go away.  Your belly becomes large or puffy (swollen).  You have a hard time peeing (totally emptying your bladder).  You have pressure on your bladder.  You have pain during sex.  You feel fullness, pressure, or discomfort in your belly.  You lose weight for no reason.  You feel sick most of the time.  You have a hard time pooping (constipation).  You do not feel like eating.  You develop pimples (acne).  You have an increase in hair on your body and  face.  You are gaining weight for no reason.  You think you are pregnant. GET HELP RIGHT AWAY IF:   Your belly pain gets worse.  You feel sick to your stomach (nauseous), and you throw up (vomit).  You have a fever that comes on fast.  You have belly pain while pooping (bowel movement).  Your periods are heavier than usual. MAKE SURE YOU:   Understand these instructions.  Will watch your condition.  Will get help right away if you are not doing well or get worse. Document Released: 07/30/2007 Document Revised: 12/01/2012 Document Reviewed: 10/18/2012 [facility] Patient Information 2015 [place], [place]. This information is not intended to replace advice given to you by your health care provider. Make sure you discuss any questions you have with your health care provider.  Umbilical Herniorrhaphy Herniorrhaphy is surgery to repair a hernia. A hernia is the protrusion of a part of an organ through an abdominal opening. An umbilical hernia means that your hernia is in the area around your navel. If the hernia is not repaired, the gap could get bigger. Your intestines or other tissues, such as fat, could get trapped in the gap. This can lead to other health problems, such as blocked intestines. If the hernia is fixed before problems set in, you may be allowed to go home the same day as the surgery (outpatient). LET [facility] CARE PROVIDER KNOW ABOUT:  Allergies to food or medicine.  Medicines taken, including vitamins, herbs, eye drops, over-the-counter medicines, and creams.  Use of steroids (by mouth or creams).  Previous problems with anesthetics or numbing medicines.  History of bleeding problems or blood clots.  Previous surgery.  Other health problems, including diabetes and kidney problems.  Possibility of pregnancy, if this applies. RISKS AND COMPLICATIONS  Pain.  Excessive bleeding.  Hematoma. This is a pocket of blood that collects under the surgery  site.  Infection at the surgery site.  Numbness at the surgery site.  Swelling and bruising.  Blood clots.  Intestinal damage (rare).  Scarring.  Skin damage.  Development of another hernia. This may require another surgery. BEFORE THE PROCEDURE  Ask your health care provider about changing or stopping your regular medicines. You may need to stop taking aspirin, nonsteroidal anti-inflammatory drugs (NSAIDs), vitamin E, and blood thinners as early as 2 weeks before the procedure.  Do not eat or drink for 8 hours before the procedure, or as directed by your health care provider.  You might be asked to shower or wash with an antibacterial soap before the procedure.  Wear comfortable clothes that will be easy to put on after the procedure. PROCEDURE You will be given an intravenous (IV) tube. A needle will  be inserted in your arm. Medicine will flow directly into your body through this needle. You might be given medicine to help you relax (sedative). You will be given medicine that numbs the area (local anesthetic) or medicine that makes you sleep (general anesthetic). If you have open surgery:  The surgeon will make a cut (incision) in your abdomen.  The gap in the muscle wall will be repaired. The surgeon may sew the edges together over the gap or use a mesh material to strengthen the area. When mesh is used, the body grows new, strong tissue into and around it. This new tissue closes the gap.  A drain might be put in to remove excess fluid from the body after surgery.  The surgeon will close the incision with stitches, glue, or staples. If you have laparoscopic surgery:  The surgeon will make several small incisions in your abdomen.  A thin, lighted tube (laparoscope) will be inserted into the abdomen through an incision. A camera is attached to the laparoscope that allows the surgeon to see inside the abdomen.  Tools will be inserted through the other incisions to repair  the hernia. Usually, mesh is used to cover the gap.  The surgeon will close the incisions with stitches. AFTER THE PROCEDURE  You will be taken to a recovery area. A nurse will watch and check your progress.  When you are awake, feeling well, and taking fluids well, you may be allowed to go home. In some cases, you may need to stay overnight in the hospital.  Arrange for someone to drive you home. Document Released: 05/09/2008 Document Revised: 06/27/2013 Document Reviewed: 05/14/2011 [facility] Patient Information 2015 [place], [place]. This information is not intended to replace advice given to you by your health care provider. Make sure you discuss any questions you have with your health care provider.

## 2014-03-04 NOTE — ED Notes (Signed)
Patient transported to X-ray 

## 2014-03-04 NOTE — ED Provider Notes (Signed)
CSN: [id number]     Arrival date & time 03/04/14  1226 History   First MD Initiated Contact with Patient 03/04/14 1245     Chief Complaint  Patient presents with  . Abdominal Pain     (Consider location/radiation/quality/duration/timing/severity/associated sxs/prior Treatment) Patient is a 49 y.o. female presenting with chest pain.  Chest Pain Pain location:  Substernal area and epigastric Pain quality: pressure and tightness   Pain radiates to:  Does not radiate Pain radiates to the back: no   Pain severity:  Moderate Onset quality:  Gradual Duration:  1 day Timing:  Constant Progression:  Unchanged Chronicity:  New Context comment:  During exciting basketball game Relieved by:  Nothing Worsened by:  Exertion, deep breathing and certain positions Ineffective treatments:  None tried Associated symptoms: no abdominal pain, no back pain, no nausea and not vomiting   Associated symptoms comment:  Vaginal bleeding   Past Medical History  Diagnosis Date  . Hypertension   . Iron deficiency anemia   . Obesity   . Coronary artery dissection     NSTEMI 8/12: cath with mid to dist LAD spont. dissection, unable to do PCI and referred for CABG:  SVG to LAD (Dr. PVT);  Carotid U/S 6/14:  Normal carotid arteries; FMD could not be ruled out  . Dyslipidemia   . Coronary artery dissection 10/24/2010  . Angina   . Asthma     "as a child"  . Heart disease    Past Surgical History  Procedure Laterality Date  . Coronary artery bypass graft  10/24/2010    CABG X1:  coronary artery dissection -->CABG:  SVG to LAD (Dr. PVT)  . Laparoscopic tubal ligation  07/01/2011    Procedure: LAPAROSCOPIC TUBAL LIGATION;  Surgeon: Jonnie Kind, MD;  Location: [facility];  Service: Gynecology;  Laterality: Bilateral;  Laparoscopic Bilateral Tubal Sterilization with Fallope Rings; ended at 1143  . Umbilical hernia repair  07/01/2011    Procedure: HERNIA REPAIR UMBILICAL ADULT;  Surgeon: Jonnie Kind, MD;   Location: [facility];  Service: Gynecology;  Laterality: N/A;  during Laparoscopic Bilateral Tubal Sterilization  . Cervical polypectomy  07/01/2011    Procedure: CERVICAL POLYPECTOMY;  Surgeon: Jonnie Kind, MD;  Location: [facility];  Service: Gynecology;  Laterality: N/A;  Endometrial Polypectomy  . Dilation and curettage of uterus    . Cardiac surgery      open heart surgery  . Hysteroscopy w/ endometrial ablation     Family History  Problem Relation Age of Onset  . Heart attack Sister   . Diabetes Mother   . Hypertension Mother   . Heart disease Mother   . Cancer Brother 50    prostate  . Diabetes Sister   . Hypertension Sister   . Hypertension Sister    History  Substance Use Topics  . Smoking status: Never Smoker   . Smokeless tobacco: Never Used  . Alcohol Use: No   OB History    No data available     Review of Systems  Cardiovascular: Positive for chest pain.  Gastrointestinal: Negative for nausea, vomiting and abdominal pain.  Musculoskeletal: Negative for back pain.  All other systems reviewed and are negative.     Allergies  Morphine and related and Penicillins  Home Medications   Prior to Admission medications   Medication Sig Start Date End Date Taking? Authorizing Provider  amLODipine (NORVASC) 5 MG tablet TAKE 1 TABLET EVERY DAY 12/02/13   Fay Records,  MD  aspirin EC 81 MG tablet Take 1 tablet (81 mg total) by mouth daily. 12/06/10   Hillary Bow, MD  famotidine (PEPCID) 20 MG tablet Take 20 mg by mouth 2 (two) times daily as needed. Indigestion    Historical Provider, MD  metoprolol tartrate (LOPRESSOR) 25 MG tablet TAKE 1&1/2 TABLETS BY MOUTH TWICE A DAY 12/02/13   Fay Records, MD  Multiple Vitamin (MULITIVITAMIN WITH MINERALS) TABS Take 1 tablet by mouth daily.    Historical Provider, MD   BP 127/75 mmHg  Pulse 69  Temp(Src) 98.3 F (36.8 C)  Resp 16  SpO2 99%  LMP 02/16/2014 Physical Exam  Constitutional: She is oriented to person, place,  and time. She appears well-developed and well-nourished.  HENT:  Head: Normocephalic and atraumatic.  Right Ear: External ear normal.  Left Ear: External ear normal.  Eyes: Conjunctivae and EOM are normal. Pupils are equal, round, and reactive to light.  Neck: Normal range of motion. Neck supple.  Cardiovascular: Normal rate, regular rhythm, normal heart sounds and intact distal pulses.   Pulmonary/Chest: Effort normal and breath sounds normal. She exhibits tenderness.  Abdominal: Soft. Bowel sounds are normal. There is no tenderness.  Genitourinary:  Small amount of bleeding from closed cervical os  Musculoskeletal: Normal range of motion.  Neurological: She is alert and oriented to person, place, and time.  Skin: Skin is warm and dry.  Vitals reviewed.   ED Course  Procedures (including critical care time) Labs Review Labs Reviewed  WET PREP, GENITAL - Abnormal; Notable for the following:    Clue Cells Wet Prep HPF POC FEW (*)    WBC, Wet Prep HPF POC RARE (*)    All other components within normal limits  CBC WITH DIFFERENTIAL - Abnormal; Notable for the following:    Eosinophils Relative 7 (*)    All other components within normal limits  COMPREHENSIVE METABOLIC PANEL - Abnormal; Notable for the following:    Glucose, Bld 103 (*)    GFR calc non Af Amer 64 (*)    GFR calc Af Amer 74 (*)    Anion gap 4 (*)    All other components within normal limits  GC/CHLAMYDIA PROBE AMP  LIPASE, BLOOD  URINALYSIS, ROUTINE W REFLEX MICROSCOPIC  I-STAT TROPOININ, ED  POC URINE PREG, ED    Imaging Review Dg Chest 2 View  03/04/2014   CLINICAL DATA:  One day history of chest pain  EXAM: CHEST  2 VIEW  COMPARISON:  March 31, 2011  FINDINGS: The lungs are clear. Heart is borderline enlarged with pulmonary vascularity within normal limits. No adenopathy. No pneumothorax. No bone lesions. Patient is status post coronary artery bypass grafting.  IMPRESSION: Mild cardiac prominence.  No edema  or consolidation.   Electronically Signed   By: Lowella Grip M.D.   On: 03/04/2014 15:48     EKG Interpretation   Date/Time:  Saturday March 04 2014 12:42:15 EST Ventricular Rate:  63 PR Interval:  200 QRS Duration: 84 QT Interval:  450 QTC Calculation: 460 R Axis:   19 Text Interpretation:  Normal sinus rhythm No significant change since  Confirmed by Debby Freiberg [phone]) on 03/04/2014 2:10:39 PM      MDM   Final diagnoses:  Chest pain    49 y.o. female with pertinent PMH of prior coronary dissection presents with substernal chest pressure and pain while watching her son's basketball game.  She has a ho vague symptoms with a prior  spontaneous coronary dissection for which she received a CABG.  She is not sure if these symptoms feel similar.  On arrival today vitals signs and physical exam are as above. No focal neurologic deficits. Exam essentially benign, patient also complained of a small amount of vaginal bleeding. Exam as above. Patient denies malodorous discharge or vaginal irritation so will not treat for bacterial vaginosis. Likely etiology of her bleeding is menopausal she states her last period was in December.  Given history of prior spontaneous dissection without etiology will obtain CT angiogram of chest and abdomen.  Pt care to Dr. Jeneen Rinks pending CT  I have reviewed all laboratory and imaging studies if ordered as above  1. Chest pain         Debby Freiberg, MD 03/05/14 [phone]

## 2014-03-04 NOTE — ED Provider Notes (Signed)
Patient seen and examined. Her care was discussed between myself and Dr. Colin Rhein. I reviewed her studies. I reviewed them with her. I reviewed her recent history, her medical chart and examined her.  CT angiogram shows no acute vascular abnormalities. Currently she states that she has been symptom free. This was after having epigastric pain most of the day and constantly. It was relieved with GI cocktail here. She has normal troponin, an unchanged EKG.  She states she's had a little bit of pelvic discomfort but her exam was unrevealing. CT does show 5. 6 7 year adnexal cyst. It is recommended that she gets follow-up ultrasound. She does see a GYN. I've asked her to see her GYN follow-up she expressed understanding this.  She points to her umbilicus and states that "there is a little tender lump there". She is a freely reducible. No Clardy clinically, and per CT. It reduces without difficulty. She is not nauseated or vomiting.  Her symptoms sound mostly like GI. I did discuss with her that the conservative approach would be to watch her in the hospital tonight. She is fairly adamant that she feels fine and would like to go home. She states she will return with any additional difficulties. I think this is not an unreasonable plan.  Discharge recommendations are reflux precautions and prescription for Prilosec. Recheck with any difficulty with reduction of her umbilical hernia. GYN follow-up regarding her ovarian cyst.    Tanna Furry, MD 03/04/14 1735

## 2014-03-04 NOTE — ED Notes (Signed)
Per pt she has been having epigastric, lower chest pain that started a few days ago and progressively worse. sts also some vaginal bleeding. sts some nausea.

## 2014-03-06 LAB — GC/CHLAMYDIA PROBE AMP
CT Probe RNA: NEGATIVE
GC Probe RNA: NEGATIVE

## 2014-03-06 LAB — I-STAT TROPONIN, ED: Troponin i, poc: 0 ng/mL (ref 0.00–0.08)

## 2014-05-26 ENCOUNTER — Ambulatory Visit: Payer: Self-pay | Admitting: Cardiovascular Disease

## 2014-06-27 ENCOUNTER — Ambulatory Visit: Payer: Self-pay | Admitting: Cardiovascular Disease

## 2014-08-24 ENCOUNTER — Encounter: Payer: Self-pay | Admitting: Cardiovascular Disease

## 2014-08-24 ENCOUNTER — Ambulatory Visit: Payer: BLUE CROSS/BLUE SHIELD | Admitting: Cardiovascular Disease

## 2014-08-24 VITALS — BP 150/100 | HR 68 | Ht 67.5 in | Wt 276.4 lb

## 2014-08-24 DIAGNOSIS — I25810 Atherosclerosis of coronary artery bypass graft(s) without angina pectoris: Secondary | ICD-10-CM

## 2014-08-24 DIAGNOSIS — I10 Essential (primary) hypertension: Secondary | ICD-10-CM

## 2014-08-24 DIAGNOSIS — E785 Hyperlipidemia, unspecified: Secondary | ICD-10-CM

## 2014-08-24 DIAGNOSIS — K219 Gastro-esophageal reflux disease without esophagitis: Secondary | ICD-10-CM

## 2014-08-24 MED ORDER — ATORVASTATIN CALCIUM 20 MG PO TABS: 20.0000 mg | ORAL_TABLET | Freq: Every day | ORAL | Status: DC

## 2014-08-24 MED ORDER — LISINOPRIL 20 MG PO TABS: 20.0000 mg | ORAL_TABLET | Freq: Every day | ORAL | Status: DC

## 2014-08-24 NOTE — Patient Instructions (Signed)
Your physician wants you to follow-up in: 1 year with Dr Virgina Jock will receive a reminder letter in the mail two months in advance. If you don't receive a letter, please call our office to schedule the follow-up appointment.     STOP Pepcid  STOP Amlodipine   START lisinopril 10 mg daily   START Lipitor 20 mg at dinner  Get fasting lab work in 5 days     Thank you for choosing [facility] !

## 2014-08-24 NOTE — Progress Notes (Signed)
Patient ID: Ashlee Stein, female   DOB: 06-01-1965, 49 y.o.   MRN: [id number]      SUBJECTIVE: The patient is a 49 year old woman with a history of coronary artery disease and underwent 1-vessel CABG (SVG to LAD) in August 2012. This is my first time meeting her. She also has a history of hypertension. Most recent echocardiogram I find is dated 11/21/10 and showed normal left ventricular systolic function and regional wall motion, LVEF 60-65%, mild mitral regurgitation, mild biatrial dilatation, mild right ventricular dilatation, and moderate tricuspid regurgitation, PA pressure 34 mmHg.  She denies exertional chest pain and exertional dyspnea, as well as leg swelling, orthopnea, and paroxysmal nocturnal dyspnea. She does have acid reflux symptoms and tries not to eat anything after 8 PM. She does take omeprazole. She said her blood pressure is usually normal.   She is here with her mother.  She tries to walk 10,000 steps daily at work but wants to exercise more and lose more weight.  Soc: Married. 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  Review of Systems: As per "subjective", otherwise negative.  Allergies  Allergen Reactions  . Morphine And Related Rash  . Penicillins Rash    Current Outpatient Prescriptions  Medication Sig Dispense Refill  . amLODipine (NORVASC) 5 MG tablet TAKE 1 TABLET EVERY DAY 90 tablet 3  . aspirin EC 81 MG tablet Take 1 tablet (81 mg total) by mouth daily.    . famotidine (PEPCID) 20 MG tablet Take 20 mg by mouth 2 (two) times daily as needed. Indigestion    . metoprolol tartrate (LOPRESSOR) 25 MG tablet TAKE 1&1/2 TABLETS BY MOUTH TWICE A DAY 270 tablet 3  . Multiple Vitamin (MULITIVITAMIN WITH MINERALS) TABS Take 1 tablet by mouth daily.    Marland Kitchen omeprazole (PRILOSEC) 20 MG capsule Take 1 capsule (20 mg total) by mouth 2 (two) times daily. 60 capsule 0   No current facility-administered medications for this visit.    Past  Medical History  Diagnosis Date  . Hypertension   . Iron deficiency anemia   . Obesity   . Coronary artery dissection     NSTEMI 8/12: cath with mid to dist LAD spont. dissection, unable to do PCI and referred for CABG:  SVG to LAD (Dr. PVT);  Carotid U/S 6/14:  Normal carotid arteries; FMD could not be ruled out  . Dyslipidemia   . Coronary artery dissection 10/24/2010  . Angina   . Asthma     "as a child"  . Heart disease     Past Surgical History  Procedure Laterality Date  . Coronary artery bypass graft  10/24/2010    CABG X1:  coronary artery dissection -->CABG:  SVG to LAD (Dr. PVT)  . Laparoscopic tubal ligation  07/01/2011    Procedure: LAPAROSCOPIC TUBAL LIGATION;  Surgeon: Jonnie Kind, MD;  Location: [facility];  Service: Gynecology;  Laterality: Bilateral;  Laparoscopic Bilateral Tubal Sterilization with Fallope Rings; ended at 1143  . Umbilical hernia repair  07/01/2011    Procedure: HERNIA REPAIR UMBILICAL ADULT;  Surgeon: Jonnie Kind, MD;  Location: [facility];  Service: Gynecology;  Laterality: N/A;  during Laparoscopic Bilateral Tubal Sterilization  . Cervical polypectomy  07/01/2011    Procedure: CERVICAL POLYPECTOMY;  Surgeon: Jonnie Kind, MD;  Location: [facility];  Service: Gynecology;  Laterality: N/A;  Endometrial Polypectomy  . Dilation and curettage of uterus    . Cardiac surgery      open  heart surgery  . Hysteroscopy w/ endometrial ablation      History   Social History  . Marital Status: Married    Spouse Name: N/A  . Number of Children: N/A  . Years of Education: N/A   Occupational History  . Not on file.   Social History Main Topics  . Smoking status: Never Smoker   . Smokeless tobacco: Never Used  . Alcohol Use: No  . Drug Use: No  . Sexual Activity: Yes    Birth Control/ Protection: Surgical   Other Topics Concern  . Not on file   Social History Narrative     Filed Vitals:   08/24/14 1029  BP: 150/100  Pulse: 68  Height: 5' 7.5"  (1.715 m)  Weight: 276 lb 6.4 oz (125.374 kg)  SpO2: 98%    PHYSICAL EXAM General: NAD HEENT: Normal. Neck: No JVD, no thyromegaly. Lungs: Clear to auscultation bilaterally with normal respiratory effort. CV: Nondisplaced PMI.  Regular rate and rhythm, normal S1/S2, no S3/S4, no murmur. No pretibial or periankle edema.  No carotid bruit.   Abdomen: Soft, nontender, obese, no distention.  Neurologic: Alert and oriented x 3.  Psych: Normal affect. Skin: Normal. Musculoskeletal: Normal range of motion, no gross deformities. Extremities: No clubbing or cyanosis.   ECG: Most recent ECG reviewed.      ASSESSMENT AND PLAN: 1. CAD/CABG: Stable ischemic heart disease.  Continue aspirin 81 mg and metoprolol 37.5 mg twice daily. Will initiate Lipitor 20 mg daily for its pleiotropic effects.  2. Essential HTN: Elevated but says it is usually normal. Will switch amlodipine to lisinopril 10 mg daily given h/o MI.  3. Hyperlipidemia: Will check lipids.  4. GERD: D/c Pepcid, continue omeprazole. Dietary counseling provided.  Dispo: f/u 1 year.  Kate Sable, M.D., F.A.C.C.

## 2014-09-07 ENCOUNTER — Ambulatory Visit: Payer: Self-pay | Admitting: Obstetrics & Gynecology

## 2014-09-08 ENCOUNTER — Encounter: Payer: Self-pay | Admitting: Obstetrics and Gynecology

## 2014-09-08 ENCOUNTER — Ambulatory Visit (INDEPENDENT_AMBULATORY_CARE_PROVIDER_SITE_OTHER): Payer: BLUE CROSS/BLUE SHIELD | Admitting: Obstetrics and Gynecology

## 2014-09-08 VITALS — BP 136/90 | Ht 67.5 in | Wt 279.0 lb

## 2014-09-08 DIAGNOSIS — R109 Unspecified abdominal pain: Secondary | ICD-10-CM | POA: Diagnosis not present

## 2014-09-08 DIAGNOSIS — T192XXA Foreign body in vulva and vagina, initial encounter: Secondary | ICD-10-CM

## 2014-09-08 DIAGNOSIS — N898 Other specified noninflammatory disorders of vagina: Secondary | ICD-10-CM | POA: Diagnosis not present

## 2014-09-08 LAB — POCT WET PREP (WET MOUNT)

## 2014-09-08 NOTE — Progress Notes (Signed)
Patient ID: Ashlee Stein, female   DOB: 1965/06/03, 49 y.o.   MRN: [id number] Pt here today for lower left side/abdominal pain, possibly a retained tampon.

## 2014-09-08 NOTE — Progress Notes (Signed)
Patient ID: Deltha Bernales, female   DOB: 05-11-1965, 49 y.o.   MRN: [id number]    [facility] Visit  Patient name: Ashlee Stein MRN [id number]  Date of birth: 1965-07-06  CC & HPI:  Ashlee Stein is a 49 y.o. female presenting today for suspected foreign body in her vagina. She thinks she has a tampon in her vagina that has been there for 7-8 days. Pt states vaginal discharge and left-sided abdominal pain as an associated symptom. She has tried removing the tampon with no relief. She denies any other complaints at this time.  ROS:  A complete 10 system review of systems was obtained and all systems are negative except as noted in the HPI and PMH.   Pertinent History Reviewed:   Reviewed: Significant for tubal ligation, D&C of the uterus and hysteroscopy Medical         Past Medical History  Diagnosis Date  . Hypertension   . Iron deficiency anemia   . Obesity   . Coronary artery dissection     NSTEMI 8/12: cath with mid to dist LAD spont. dissection, unable to do PCI and referred for CABG:  SVG to LAD (Dr. PVT);  Carotid U/S 6/14:  Normal carotid arteries; FMD could not be ruled out  . Dyslipidemia   . Coronary artery dissection 10/24/2010  . Angina   . Asthma     "as a child"  . Heart disease                               Surgical Hx:    Past Surgical History  Procedure Laterality Date  . Coronary artery bypass graft  10/24/2010    CABG X1:  coronary artery dissection -->CABG:  SVG to LAD (Dr. PVT)  . Laparoscopic tubal ligation  07/01/2011    Procedure: LAPAROSCOPIC TUBAL LIGATION;  Surgeon: Jonnie Kind, MD;  Location: [facility];  Service: Gynecology;  Laterality: Bilateral;  Laparoscopic Bilateral Tubal Sterilization with Fallope Rings; ended at 1143  . Umbilical hernia repair  07/01/2011    Procedure: HERNIA REPAIR UMBILICAL ADULT;  Surgeon: Jonnie Kind, MD;  Location: [facility];  Service: Gynecology;  Laterality: N/A;  during Laparoscopic Bilateral Tubal Sterilization  .  Cervical polypectomy  07/01/2011    Procedure: CERVICAL POLYPECTOMY;  Surgeon: Jonnie Kind, MD;  Location: [facility];  Service: Gynecology;  Laterality: N/A;  Endometrial Polypectomy  . Dilation and curettage of uterus    . Cardiac surgery      open heart surgery  . Hysteroscopy w/ endometrial ablation     Medications: Reviewed & Updated - see associated section                       Current outpatient prescriptions:  .  aspirin EC 81 MG tablet, Take 1 tablet (81 mg total) by mouth daily., Disp: , Rfl:  .  atorvastatin (LIPITOR) 20 MG tablet, Take 1 tablet (20 mg total) by mouth daily., Disp: 90 tablet, Rfl: 3 .  lisinopril (PRINIVIL,ZESTRIL) 20 MG tablet, Take 1 tablet (20 mg total) by mouth daily., Disp: 90 tablet, Rfl: 3 .  metoprolol tartrate (LOPRESSOR) 25 MG tablet, TAKE 1&1/2 TABLETS BY MOUTH TWICE A DAY, Disp: 270 tablet, Rfl: 3 .  Multiple Vitamin (MULITIVITAMIN WITH MINERALS) TABS, Take 1 tablet by mouth daily., Disp: , Rfl:  .  omeprazole (PRILOSEC) 20 MG capsule, Take 1 capsule (  20 mg total) by mouth 2 (two) times daily., Disp: 60 capsule, Rfl: 0   Social History: Reviewed -  reports that she has never smoked. She has never used smokeless tobacco.  Objective Findings:  Vitals: Blood pressure 136/90, height 5' 7.5" (1.715 m), weight 279 lb (126.554 kg), last menstrual period 09/01/2014.  Physical Examination: General appearance - alert, well appearing, and in no distress, oriented to person, place, and time and normal appearing weight Mental status - alert, oriented to person, place, and time, normal mood, behavior, speech, dress, motor activity, and thought processes Pelvic - normal external genitalia, vulva, vagina, cervix, uterus and adnexa VULVA: normal appearing vulva with no masses, tenderness or lesions VAGINA: normal appearing vagina with normal color and discharge, no lesions; no foreign body seen using speculum or bimanual exam; small nodule on the vaginal wall CERVIX:  normal appearing cervix without discharge or lesions UTERUS: uterus is larger in size, shape, consistency and nontender ADNEXA: normal adnexa in size, nontender and no masses  Wet prep: epithelials only Assessment & Plan:   A:  1. No foreign body found 2. Normal secretions per wet prep  P:  1. Follow-up, prn    This chart was scribed for Jonnie Kind, MD by Tula Nakayama, Medical Scribe. This patient was seen in room 2 and the patient's care was started at 1:45 PM.   I personally performed the services described in this documentation, which was SCRIBED in my presence. The recorded information has been reviewed and considered accurate. It has been edited as necessary during review. Jonnie Kind, MD

## 2014-09-21 ENCOUNTER — Telehealth: Payer: Self-pay | Admitting: Cardiovascular Disease

## 2014-09-21 NOTE — Telephone Encounter (Signed)
Pt states she has been having headaches for 2 wks since she began taking the new medication Dr. Bronson Ing put her on

## 2014-09-22 NOTE — Telephone Encounter (Signed)
PT states she started both medications at the same time, and has had a headache since starting them.

## 2014-09-22 NOTE — Telephone Encounter (Signed)
Will forward to Dr. Koneswaran  

## 2014-09-22 NOTE — Telephone Encounter (Signed)
Which one? I started both lisinopril and Lipitor.

## 2014-09-24 NOTE — Telephone Encounter (Signed)
Have her get BP checked. Can also try stopping Lipitor and see if this helps.

## 2014-09-26 ENCOUNTER — Telehealth: Payer: Self-pay

## 2014-09-26 MED ORDER — AZILSARTAN MEDOXOMIL 40 MG PO TABS: 40.0000 mg | ORAL_TABLET | Freq: Every day | ORAL | Status: DC

## 2014-09-26 NOTE — Addendum Note (Signed)
Addended by: Barbarann Ehlers A on: 09/26/2014 12:21 PM   Modules accepted: Orders

## 2014-09-26 NOTE — Telephone Encounter (Signed)
Herminio Commons, MD  Drema Dallas, CMA           Rather than stopping Lipitor, have her stop lisinopril and switch to Edarbi 40 mg once daily. Can usually get for $10 monthly if filled at [facility] in [place]. We should have the coupon cards but if not, contact rep Shane. [facility] staff has his number if you don't have it.  Should also check BP.   Dr. Kathi Ludwig        Left message for pt to call back-cc

## 2014-09-26 NOTE — Telephone Encounter (Signed)
I spoke with pt,she will stop lisinopril and take Edarbi 40 mg daily.She is out of work now but will call back to schedule BP apt

## 2014-09-26 NOTE — Telephone Encounter (Signed)
Herminio Commons, MD  Drema Dallas, CMA           Have BP checked. Can also try stopping Lipitor. Unusual for either lisinopril or Lipitor to cause severe headaches.       Previous Messages

## 2014-09-29 ENCOUNTER — Other Ambulatory Visit: Payer: Self-pay | Admitting: Internal Medicine

## 2014-10-11 ENCOUNTER — Telehealth: Payer: Self-pay

## 2014-10-11 MED ORDER — AZILSARTAN MEDOXOMIL 80 MG PO TABS: 80.0000 mg | ORAL_TABLET | Freq: Every day | ORAL | Status: DC

## 2014-10-11 NOTE — Telephone Encounter (Signed)
Herminio Commons, MD  Drema Dallas, CMA           Increase Edarbi to 80 mg once daily. Can keep her out of work for rest of week.       Previous Messages     ----- Message -----   From: Drema Dallas, CMA   Sent: 10/10/2014  1:41 PM    To: Herminio Commons, MD   Kenney Houseman [phone]) missed her BP check apt. for yesterday, but came in this afternoon- her BP was 148/112. She said she is taking all her medications, but continues to have high BP readings at home. She states that she keeps headaches and feels dizzy a lot of the time. She is out of work for this week but wants to have it extended for a little longer because she feels that she isn't ready to go back and it is better for her to be well prior to going back verses going back and then having to be put back out because of her BP. Please advise             10/11/14 LM for patient to call back to inform of drug increase,e-scribed new rx to pharmacy

## 2014-10-12 ENCOUNTER — Telehealth: Payer: Self-pay

## 2014-10-12 NOTE — Telephone Encounter (Signed)
Attempted to reach pt again today.Left message on private voicemail that her [name] is increased to 80 mg daily and I already e-scribed to her pharmacy and that Dr Bronson Ing wants her to remain out of work the rest of this week

## 2014-10-13 NOTE — Telephone Encounter (Signed)
10/13/14 i finally spoke with pt and she did receive my message to increase the [place]

## 2014-10-20 ENCOUNTER — Telehealth: Payer: Self-pay

## 2014-10-25 NOTE — Telephone Encounter (Signed)
ERROR

## 2014-11-23 ENCOUNTER — Other Ambulatory Visit (HOSPITAL_COMMUNITY): Payer: Self-pay | Admitting: Internal Medicine

## 2014-11-23 DIAGNOSIS — Z1231 Encounter for screening mammogram for malignant neoplasm of breast: Secondary | ICD-10-CM

## 2015-01-12 ENCOUNTER — Other Ambulatory Visit: Payer: Self-pay | Admitting: Internal Medicine

## 2015-01-25 ENCOUNTER — Other Ambulatory Visit (HOSPITAL_COMMUNITY): Payer: Self-pay | Admitting: Internal Medicine

## 2015-01-25 DIAGNOSIS — Z1231 Encounter for screening mammogram for malignant neoplasm of breast: Secondary | ICD-10-CM

## 2015-01-31 ENCOUNTER — Encounter: Payer: Self-pay | Admitting: Internal Medicine

## 2015-01-31 ENCOUNTER — Telehealth: Payer: Self-pay | Admitting: Internal Medicine

## 2015-01-31 NOTE — Telephone Encounter (Signed)
New Message  This message is to inform you that we have made 3 consecutive attempts to contact the patient since 12/14/2014. We have also mailed a letter to the patient to inform them to call in and schedule their follow up appt. Although we were unsuccessful in these attempts we wanted you to be aware of our efforts. Will remove the patient from our recall report at this time.  Thanks,  Cathlean Marseilles [facility]

## 2015-01-31 NOTE — Telephone Encounter (Signed)
Noted  

## 2015-02-12 ENCOUNTER — Ambulatory Visit (HOSPITAL_COMMUNITY)
Admission: RE | Admit: 2015-02-12 | Discharge: 2015-02-12 | Disposition: A | Discharge status: Home / Self Care | Payer: BLUE CROSS/BLUE SHIELD | Source: Ambulatory Visit | Attending: Internal Medicine | Admitting: Internal Medicine

## 2015-02-12 DIAGNOSIS — Z1231 Encounter for screening mammogram for malignant neoplasm of breast: Secondary | ICD-10-CM | POA: Insufficient documentation

## 2015-02-27 ENCOUNTER — Ambulatory Visit (HOSPITAL_COMMUNITY): Payer: BLUE CROSS/BLUE SHIELD

## 2015-04-17 ENCOUNTER — Ambulatory Visit: Payer: BLUE CROSS/BLUE SHIELD | Admitting: Obstetrics and Gynecology

## 2015-04-23 ENCOUNTER — Other Ambulatory Visit (HOSPITAL_COMMUNITY)
Admission: RE | Admit: 2015-04-23 | Discharge: 2015-04-23 | Disposition: A | Discharge status: Home / Self Care | Payer: BLUE CROSS/BLUE SHIELD | Source: Ambulatory Visit | Attending: Obstetrics and Gynecology | Admitting: Obstetrics and Gynecology

## 2015-04-23 ENCOUNTER — Encounter: Payer: Self-pay | Admitting: Obstetrics and Gynecology

## 2015-04-23 ENCOUNTER — Ambulatory Visit (INDEPENDENT_AMBULATORY_CARE_PROVIDER_SITE_OTHER): Payer: BLUE CROSS/BLUE SHIELD | Admitting: Obstetrics and Gynecology

## 2015-04-23 VITALS — BP 118/78 | Ht 67.0 in | Wt 274.5 lb

## 2015-04-23 DIAGNOSIS — Z01419 Encounter for gynecological examination (general) (routine) without abnormal findings: Secondary | ICD-10-CM

## 2015-04-23 DIAGNOSIS — Z1151 Encounter for screening for human papillomavirus (HPV): Secondary | ICD-10-CM | POA: Insufficient documentation

## 2015-04-23 NOTE — Progress Notes (Signed)
Patient ID: Ashlee Stein, female   DOB: April 13, 1965, 50 y.o.   MRN: [id number]  Assessment:  Annual Gyn Exam Fibroid uterus 16-18 wk size  Abdominal hysterectomy discussed.   marked obesity,BMI 3.  Weight loss strategies discussed.pt desires preop weight loss Rectus abdominus diastasis above umbilicus Plan:  1. pap smear done, next pap due in 3 years 2. return 3 months to review wt loss progress. 3    Annual mammogram advised  Subjective:  Ashlee Stein is a 50 y.o. female No obstetric history on file. who presents for annual exam. Patient's last menstrual period was 04/15/2015. The patient has complaints today of an intermittent suprapubic "tingling" sensation that began since her LMP on 04/15/15. Patient states she uses both tampons and pads. Her menstrual periods last for 3.5 days. Patient notes a history of endometrial ablation.   The following portions of the patient's history were reviewed and updated as appropriate: allergies, current medications, past family history, past medical history, past social history, past surgical history and problem list. Past Medical History  Diagnosis Date  . Hypertension   . Iron deficiency anemia   . Obesity   . Coronary artery dissection     NSTEMI 8/12: cath with mid to dist LAD spont. dissection, unable to do PCI and referred for CABG:  SVG to LAD (Dr. PVT);  Carotid U/S 6/14:  Normal carotid arteries; FMD could not be ruled out  . Dyslipidemia   . Coronary artery dissection 10/24/2010  . Angina   . Asthma     "as a child"  . Heart disease     Past Surgical History  Procedure Laterality Date  . Coronary artery bypass graft  10/24/2010    CABG X1:  coronary artery dissection -->CABG:  SVG to LAD (Dr. PVT)  . Laparoscopic tubal ligation  07/01/2011    Procedure: LAPAROSCOPIC TUBAL LIGATION;  Surgeon: Jonnie Kind, MD;  Location: [facility];  Service: Gynecology;  Laterality: Bilateral;  Laparoscopic Bilateral Tubal Sterilization with Fallope Rings;  ended at 1143  . Umbilical hernia repair  07/01/2011    Procedure: HERNIA REPAIR UMBILICAL ADULT;  Surgeon: Jonnie Kind, MD;  Location: [facility];  Service: Gynecology;  Laterality: N/A;  during Laparoscopic Bilateral Tubal Sterilization  . Cervical polypectomy  07/01/2011    Procedure: CERVICAL POLYPECTOMY;  Surgeon: Jonnie Kind, MD;  Location: [facility];  Service: Gynecology;  Laterality: N/A;  Endometrial Polypectomy  . Dilation and curettage of uterus    . Cardiac surgery      open heart surgery  . Hysteroscopy w/ endometrial ablation       Current outpatient prescriptions:  .  aspirin EC 81 MG tablet, Take 1 tablet (81 mg total) by mouth daily., Disp: , Rfl:  .  atorvastatin (LIPITOR) 20 MG tablet, Take 1 tablet (20 mg total) by mouth daily., Disp: 90 tablet, Rfl: 3 .  lisinopril-hydrochlorothiazide (PRINZIDE,ZESTORETIC) 20-25 MG tablet, Take 1 tablet by mouth daily., Disp: , Rfl: 3 .  metoprolol tartrate (LOPRESSOR) 25 MG tablet, TAKE 1 & 1/2 TABLETS BY MOUTH TWICE A DAY, Disp: 270 tablet, Rfl: 1 .  omeprazole (PRILOSEC) 20 MG capsule, Take 1 capsule (20 mg total) by mouth 2 (two) times daily., Disp: 60 capsule, Rfl: 0 .  Azilsartan Medoxomil 80 MG TABS, Take 1 tablet (80 mg total) by mouth daily. (Patient not taking: Reported on 04/23/2015), Disp: 30 tablet, Rfl: 6 .  Multiple Vitamin (MULITIVITAMIN WITH MINERALS) TABS, Take 1 tablet by mouth daily. Reported  on 04/23/2015, Disp: , Rfl:   Review of Systems Constitutional: negative Gastrointestinal: negative Genitourinary: tingling suprapubic sensation A complete 10 system review of systems was obtained and all systems are negative except as noted in the HPI and PMH.    Objective:  BP 118/78 mmHg  Ht 5\' 7"  (1.702 m)  Wt 274 lb 8 oz (124.512 kg)  BMI 42.98 kg/m2  LMP 04/15/2015   BMI: Body mass index is 42.98 kg/(m^2).  General Appearance: Alert, appropriate appearance for age. No acute distress HEENT: Grossly normal Neck /  Thyroid:  Cardiovascular: RRR; normal S1, S2, no murmur Lungs: CTA bilaterally Back: No CVAT Breast Exam: No masses or nodes.No dimpling, nipple retraction or discharge. Gastrointestinal: Soft, non-tender, no masses or organomegaly Musculoskeletal: Rectus diastasis from umbilicus upward to xyphoid Pelvic Exam: External genitalia: normal general appearance Urinary system: urethral meatus normal Vaginal: normal mucosa without prolapse or lesions  Cervix: normal appearance, well supported, not LAVH candidate. Adnexa: normal bimanual exam Uterus: normal single, nontender, 16+weeks size Rectal: good sphincter tone, no masses and guaiac negative Rectovaginal: normal rectal, no masses Lymphatic Exam: Non-palpable nodes in neck, clavicular, axillary, or inguinal regions  Skin: no rash or abnormalities Neurologic: Normal gait and speech, no tremor  Psychiatric: Alert and oriented, appropriate affect.  Urinalysis:Not done  Mallory Shirk. MD Pgr [phone] 2:33 PM    By signing my name below, I, Ashlee Stein, attest that this documentation has been prepared under the direction and in the presence of Jonnie Kind, MD. Electronically Signed: Stephania Stein, ED Scribe. 04/23/2015. 2:49 PM.  .I personally performed the services described in this documentation, which was SCRIBED in my presence. The recorded information has been reviewed and considered accurate. It has been edited as necessary during review. Jonnie Kind, MD

## 2015-04-23 NOTE — Progress Notes (Signed)
Patient ID: Ashlee Stein, female   DOB: March 11, 1965, 50 y.o.   MRN: [id number] Pt here today for her annual exam. Pt states that she is having a problem she would like to discuss with Dr. Glo Herring.

## 2015-04-24 LAB — CYTOLOGY - PAP

## 2015-07-27 ENCOUNTER — Ambulatory Visit: Payer: BLUE CROSS/BLUE SHIELD | Admitting: Obstetrics and Gynecology

## 2015-08-24 ENCOUNTER — Encounter: Payer: Self-pay | Admitting: Obstetrics and Gynecology

## 2015-08-24 ENCOUNTER — Ambulatory Visit (INDEPENDENT_AMBULATORY_CARE_PROVIDER_SITE_OTHER): Payer: BLUE CROSS/BLUE SHIELD | Admitting: Obstetrics and Gynecology

## 2015-08-24 DIAGNOSIS — Z6841 Body Mass Index (BMI) 40.0 and over, adult: Secondary | ICD-10-CM | POA: Diagnosis not present

## 2015-08-24 DIAGNOSIS — D259 Leiomyoma of uterus, unspecified: Secondary | ICD-10-CM | POA: Diagnosis not present

## 2015-08-24 NOTE — Progress Notes (Signed)
[facility] Visit  08/24/2015            Patient name: Ashlee Stein MRN [id number]  Date of birth: 1965/08/16  CC & HPI:  Ashlee Stein is a 50 y.o. female presenting today for follow up of weight loss. Pt states that she was seen 4 months ago for her annual exam and voiced wanting an abdominal hysterectomy. Pt was informed the need for weight loss prior to surgery. Pt notes that she has not been weighing herself since last being seen. Pt has not tried any medications for the relief of her symptoms. Denies any other symptoms.   ROS:  Review of Systems  Constitutional: Negative for weight loss.       Weight gain  All other systems reviewed and are negative.   Pertinent History Reviewed:   Reviewed: Significant for HTN Medical         Past Medical History  Diagnosis Date   Hypertension    Iron deficiency anemia    Obesity    Coronary artery dissection     NSTEMI 8/12: cath with mid to dist LAD spont. dissection, unable to do PCI and referred for CABG:  SVG to LAD (Dr. PVT);  Carotid U/S 6/14:  Normal carotid arteries; FMD could not be ruled out   Dyslipidemia    Coronary artery dissection 10/24/2010   Angina    Asthma     "as a child"   Heart disease                               Surgical Hx:    Past Surgical History  Procedure Laterality Date   Coronary artery bypass graft  10/24/2010    CABG X1:  coronary artery dissection -->CABG:  SVG to LAD (Dr. PVT)   Laparoscopic tubal ligation  07/01/2011    Procedure: LAPAROSCOPIC TUBAL LIGATION;  Surgeon: Jonnie Kind, MD;  Location: [facility];  Service: Gynecology;  Laterality: Bilateral;  Laparoscopic Bilateral Tubal Sterilization with Fallope Rings; ended at [id number]   Umbilical hernia repair  07/01/2011    Procedure: HERNIA REPAIR UMBILICAL ADULT;  Surgeon: Jonnie Kind, MD;  Location: [facility];  Service: Gynecology;  Laterality: N/A;  during Laparoscopic Bilateral Tubal Sterilization   Cervical polypectomy  07/01/2011     Procedure: CERVICAL POLYPECTOMY;  Surgeon: Jonnie Kind, MD;  Location: [facility];  Service: Gynecology;  Laterality: N/A;  Endometrial Polypectomy   Dilation and curettage of uterus     Cardiac surgery      open heart surgery   Hysteroscopy w/ endometrial ablation     Medications: Reviewed & Updated - see associated section                       Current outpatient prescriptions:    aspirin EC 81 MG tablet, Take 1 tablet (81 mg total) by mouth daily., Disp: , Rfl:    atorvastatin (LIPITOR) 20 MG tablet, Take 1 tablet (20 mg total) by mouth daily., Disp: 90 tablet, Rfl: 3   Azilsartan Medoxomil 80 MG TABS, Take 1 tablet (80 mg total) by mouth daily. (Patient not taking: Reported on 04/23/2015), Disp: 30 tablet, Rfl: 6   lisinopril-hydrochlorothiazide (PRINZIDE,ZESTORETIC) 20-25 MG tablet, Take 1 tablet by mouth daily., Disp: , Rfl: 3   metoprolol tartrate (LOPRESSOR) 25 MG tablet, TAKE 1 & 1/2 TABLETS BY MOUTH TWICE A DAY, Disp: 270 tablet,  Rfl: 1   Multiple Vitamin (MULITIVITAMIN WITH MINERALS) TABS, Take 1 tablet by mouth daily. Reported on 04/23/2015, Disp: , Rfl:    omeprazole (PRILOSEC) 20 MG capsule, Take 1 capsule (20 mg total) by mouth 2 (two) times daily., Disp: 60 capsule, Rfl: 0   Social History: Reviewed -  reports that she has never smoked. She has never used smokeless tobacco.  Objective Findings:  Vitals: Blood pressure 122/80, height 5' 7.5" (1.715 m), weight 281 lb (127.461 kg).  Physical Examination: discussion only  Discussed with pt risks and benefits of weight loss prior to abdominal hysterectomy. At end of discussion, pt had opportunity to ask questions and has no further questions at this time.   Greater than 50% was spent in counseling and coordination of care with the patient. Total time greater than: 20 minutes   Assessment & Plan:   A:  1. Obesity, no progress 2. Uterine fibroids  P:  1. Delay surgery discussion in 8 weeks for  re-evaluation   By signing my name below, I, Soijett Blue, attest that this documentation has been prepared under the direction and in the presence of Jonnie Kind, MD. Electronically Signed: Soijett Blue, ED Scribe. 08/24/2015. 11:41 AM.  I personally performed the services described in this documentation, which was SCRIBED in my presence. The recorded information has been reviewed and considered accurate. It has been edited as necessary during review. Jonnie Kind, MD

## 2015-08-24 NOTE — Progress Notes (Signed)
Patient ID: Ashlee Stein, female   DOB: April 16, 1965, 50 y.o.   MRN: [id number]  [facility] Visit  08/24/2015            Patient name: Ashlee Stein MRN [id number]  Date of birth: [birth year]  CC & HPI:  Ashlee Stein is a 50 y.o. female presenting today for follow up of weight loss. Pt states that she was seen 4 months ago for her annual exam and voiced wanting an abdominal hysterectomy. Pt was informed the need for weight loss prior to surgery. Pt notes that she has not been weighing herself since last being seen. Pt has not tried any medications for the relief of her symptoms. Denies any other symptoms.   ROS:  Review of Systems  Constitutional: Negative for weight loss.       Weight gain  All other systems reviewed and are negative.   Pertinent History Reviewed:   Reviewed: Significant for HTN Medical         Past Medical History  Diagnosis Date  . Hypertension   . Iron deficiency anemia   . Obesity   . Coronary artery dissection     NSTEMI 8/12: cath with mid to dist LAD spont. dissection, unable to do PCI and referred for CABG:  SVG to LAD (Dr. PVT);  Carotid U/S 6/14:  Normal carotid arteries; FMD could not be ruled out  . Dyslipidemia   . Coronary artery dissection 10/24/2010  . Angina   . Asthma     "as a child"  . Heart disease                               Surgical Hx:    Past Surgical History  Procedure Laterality Date  . Coronary artery bypass graft  10/24/2010    CABG X1:  coronary artery dissection -->CABG:  SVG to LAD (Dr. PVT)  . Laparoscopic tubal ligation  07/01/2011    Procedure: LAPAROSCOPIC TUBAL LIGATION;  Surgeon: Jonnie Kind, MD;  Location: [facility];  Service: Gynecology;  Laterality: Bilateral;  Laparoscopic Bilateral Tubal Sterilization with Fallope Rings; ended at 1143  . Umbilical hernia repair  07/01/2011    Procedure: HERNIA REPAIR UMBILICAL ADULT;  Surgeon: Jonnie Kind, MD;  Location: [facility];  Service: Gynecology;  Laterality: N/A;  during  Laparoscopic Bilateral Tubal Sterilization  . Cervical polypectomy  07/01/2011    Procedure: CERVICAL POLYPECTOMY;  Surgeon: Jonnie Kind, MD;  Location: [facility];  Service: Gynecology;  Laterality: N/A;  Endometrial Polypectomy  . Dilation and curettage of uterus    . Cardiac surgery      open heart surgery  . Hysteroscopy w/ endometrial ablation     Medications: Reviewed & Updated - see associated section                       Current outpatient prescriptions:  .  aspirin EC 81 MG tablet, Take 1 tablet (81 mg total) by mouth daily., Disp: , Rfl:  .  atorvastatin (LIPITOR) 20 MG tablet, Take 1 tablet (20 mg total) by mouth daily., Disp: 90 tablet, Rfl: 3 .  Azilsartan Medoxomil 80 MG TABS, Take 1 tablet (80 mg total) by mouth daily. (Patient not taking: Reported on 04/23/2015), Disp: 30 tablet, Rfl: 6 .  lisinopril-hydrochlorothiazide (PRINZIDE,ZESTORETIC) 20-25 MG tablet, Take 1 tablet by mouth daily., Disp: , Rfl: 3 .  metoprolol tartrate (LOPRESSOR) 25  MG tablet, TAKE 1 & 1/2 TABLETS BY MOUTH TWICE A DAY, Disp: 270 tablet, Rfl: 1 .  Multiple Vitamin (MULITIVITAMIN WITH MINERALS) TABS, Take 1 tablet by mouth daily. Reported on 04/23/2015, Disp: , Rfl:  .  omeprazole (PRILOSEC) 20 MG capsule, Take 1 capsule (20 mg total) by mouth 2 (two) times daily., Disp: 60 capsule, Rfl: 0   Social History: Reviewed -  reports that she has never smoked. She has never used smokeless tobacco.  Objective Findings:  Vitals: Blood pressure 122/80, height 5' 7.5" (1.715 m), weight 281 lb (127.461 kg).  Physical Examination: discussion only  Discussed with pt risks and benefits of weight loss prior to abdominal hysterectomy. At end of discussion, pt had opportunity to ask questions and has no further questions at this time.   Greater than 50% was spent in counseling and coordination of care with the patient. Total time greater than: 20 minutes   Assessment & Plan:   A:  1. Obesity, no progress 2.  Uterine fibroids  P:  1. Delay surgery discussion in 8 weeks for re-evaluation   By signing my name below, I, Soijett Blue, attest that this documentation has been prepared under the direction and in the presence of Jonnie Kind, MD. Electronically Signed: Soijett Blue, ED Scribe. 08/24/2015. 11:41 AM.  I personally performed the services described in this documentation, which was SCRIBED in my presence. The recorded information has been reviewed and considered accurate. It has been edited as necessary during review. Jonnie Kind, MD

## 2015-09-18 ENCOUNTER — Other Ambulatory Visit: Payer: Self-pay

## 2015-09-18 MED ORDER — ATORVASTATIN CALCIUM 20 MG PO TABS: 20.0000 mg | ORAL_TABLET | Freq: Every day | ORAL | 3 refills | Status: DC

## 2015-09-20 ENCOUNTER — Other Ambulatory Visit: Payer: Self-pay

## 2015-09-20 MED ORDER — METOPROLOL TARTRATE 25 MG PO TABS: ORAL_TABLET | ORAL | 3 refills | Status: AC

## 2015-09-20 MED ORDER — ATORVASTATIN CALCIUM 20 MG PO TABS: 20.0000 mg | ORAL_TABLET | Freq: Every day | ORAL | 3 refills | Status: DC

## 2015-09-20 NOTE — Telephone Encounter (Signed)
REFILL SENT TO [facility] FOR METOPROLOL & ATORVASTATIN 90 DAY SUPPLY

## 2015-10-05 ENCOUNTER — Ambulatory Visit: Payer: BLUE CROSS/BLUE SHIELD | Admitting: Cardiovascular Disease

## 2015-10-12 ENCOUNTER — Ambulatory Visit: Payer: BLUE CROSS/BLUE SHIELD | Admitting: Cardiovascular Disease

## 2015-10-19 ENCOUNTER — Ambulatory Visit: Payer: BLUE CROSS/BLUE SHIELD | Admitting: Obstetrics and Gynecology

## 2015-11-22 ENCOUNTER — Ambulatory Visit: Payer: BLUE CROSS/BLUE SHIELD | Admitting: Cardiovascular Disease

## 2015-11-23 ENCOUNTER — Ambulatory Visit (INDEPENDENT_AMBULATORY_CARE_PROVIDER_SITE_OTHER): Payer: BLUE CROSS/BLUE SHIELD | Admitting: Cardiovascular Disease

## 2015-11-23 ENCOUNTER — Encounter: Payer: Self-pay | Admitting: Cardiovascular Disease

## 2015-11-23 VITALS — BP 110/78 | HR 75 | Ht 67.0 in | Wt 272.0 lb

## 2015-11-23 DIAGNOSIS — I10 Essential (primary) hypertension: Secondary | ICD-10-CM | POA: Diagnosis not present

## 2015-11-23 DIAGNOSIS — E785 Hyperlipidemia, unspecified: Secondary | ICD-10-CM

## 2015-11-23 DIAGNOSIS — I25810 Atherosclerosis of coronary artery bypass graft(s) without angina pectoris: Secondary | ICD-10-CM

## 2015-11-23 NOTE — Progress Notes (Signed)
SUBJECTIVE: The patient returns for routine follow-up. She has a history of coronary artery disease and underwent 1-vessel CABG (SVG to LAD) in August 2012.  She also has a history of hypertension.  Most recent echocardiogram I find is dated 11/21/10 and showed normal left ventricular systolic function and regional wall motion, LVEF 60-65%, mild mitral regurgitation, mild biatrial dilatation, mild right ventricular dilatation, and moderate tricuspid regurgitation, PA pressure 34 mmHg.  She denies exertional chest pain and exertional dyspnea, as well as leg swelling, orthopnea, and paroxysmal nocturnal dyspnea.   Soc: Married. 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 Lives in [place].   Review of Systems: As per "subjective", otherwise negative.  Allergies  Allergen Reactions  . Morphine And Related Rash  . Penicillins Rash    Current Outpatient Prescriptions  Medication Sig Dispense Refill  . aspirin EC 81 MG tablet Take 1 tablet (81 mg total) by mouth daily.    Marland Kitchen atorvastatin (LIPITOR) 20 MG tablet Take 1 tablet (20 mg total) by mouth daily. 90 tablet 3  . Azilsartan Medoxomil 80 MG TABS Take 1 tablet (80 mg total) by mouth daily. 30 tablet 6  . lisinopril-hydrochlorothiazide (PRINZIDE,ZESTORETIC) 20-25 MG tablet Take 1 tablet by mouth daily.  3  . metoprolol tartrate (LOPRESSOR) 25 MG tablet TAKE 1 & 1/2 TABLETS BY MOUTH TWICE A DAY 270 tablet 3  . Multiple Vitamin (MULITIVITAMIN WITH MINERALS) TABS Take 1 tablet by mouth daily. Reported on 04/23/2015     No current facility-administered medications for this visit.     Past Medical History:  Diagnosis Date  . Angina   . Asthma    "as a child"  . Coronary artery dissection    NSTEMI 8/12: cath with mid to dist LAD spont. dissection, unable to do PCI and referred for CABG:  SVG to LAD (Dr. PVT);  Carotid U/S 6/14:  Normal carotid arteries; FMD could not be ruled out  . Coronary artery  dissection 10/24/2010  . Dyslipidemia   . Heart disease   . Hypertension   . Iron deficiency anemia   . Obesity     Past Surgical History:  Procedure Laterality Date  . CARDIAC SURGERY     open heart surgery  . CERVICAL POLYPECTOMY  07/01/2011   Procedure: CERVICAL POLYPECTOMY;  Surgeon: Jonnie Kind, MD;  Location: [facility];  Service: Gynecology;  Laterality: N/A;  Endometrial Polypectomy  . CORONARY ARTERY BYPASS GRAFT  10/24/2010   CABG X1:  coronary artery dissection -->CABG:  SVG to LAD (Dr. PVT)  . DILATION AND CURETTAGE OF UTERUS    . HYSTEROSCOPY W/ ENDOMETRIAL ABLATION    . LAPAROSCOPIC TUBAL LIGATION  07/01/2011   Procedure: LAPAROSCOPIC TUBAL LIGATION;  Surgeon: Jonnie Kind, MD;  Location: [facility];  Service: Gynecology;  Laterality: Bilateral;  Laparoscopic Bilateral Tubal Sterilization with Fallope Rings; ended at 1143  . UMBILICAL HERNIA REPAIR  07/01/2011   Procedure: HERNIA REPAIR UMBILICAL ADULT;  Surgeon: Jonnie Kind, MD;  Location: [facility];  Service: Gynecology;  Laterality: N/A;  during Laparoscopic Bilateral Tubal Sterilization    Social History   Social History  . Marital status: Married    Spouse name: N/A  . Number of children: N/A  . Years of education: N/A   Occupational History  . Not on file.   Social History Main Topics  . Smoking status: Never Smoker  . Smokeless tobacco: Never Used  . Alcohol use No  .  Drug use: No  . Sexual activity: Yes    Birth control/ protection: Surgical   Other Topics Concern  . Not on file   Social History Narrative  . No narrative on file     Vitals:   11/23/15 1602  BP: 110/78  Pulse: 75  SpO2: 97%  Weight: 272 lb (123.4 kg)  Height: 5\' 7"  (1.702 m)    PHYSICAL EXAM General: NAD HEENT: Normal. Neck: No JVD, no thyromegaly. Lungs: Clear to auscultation bilaterally with normal respiratory effort. CV: Nondisplaced PMI.  Regular rate and rhythm, normal S1/S2, no S3/S4, no murmur. No pretibial or  periankle edema.  No carotid bruit.   Abdomen: Soft, obese. Neurologic: Alert and oriented.  Psych: Normal affect. Skin: Normal. Musculoskeletal: No gross deformities.    ECG: Most recent ECG reviewed.      ASSESSMENT AND PLAN: 1. CAD/CABG: Stable ischemic heart disease.  Continue aspirin 81 mg, Lipitor 20 mg, and metoprolol 37.5 mg twice daily.   2. Essential HTN: Controlled. No changes.  3. Hyperlipidemia: Will check lipids. Continue Lipitor.  Dispo: fu 1 year.  Kate Sable, M.D., F.A.C.C.

## 2015-11-23 NOTE — Patient Instructions (Signed)
Your physician wants you to follow-up in:  1 year Dr Virgina Jock will receive a reminder letter in the mail two months in advance. If you don't receive a letter, please call our office to schedule the follow-up appointment.     Get FASTING lipids      Your physician recommends that you continue on your current medications as directed. Please refer to the Current Medication list given to you today.   Thank you for choosing [facility] !

## 2016-02-14 ENCOUNTER — Telehealth: Payer: Self-pay | Admitting: *Deleted

## 2016-02-14 NOTE — Telephone Encounter (Signed)
Patient called with complaints of a "bump" on her labia that is causing irritation. I explained to patient that we would not be able to treat or diagnose her over the phone so she would need to schedule an appointment. Pt verbalized understanding. Transferred to schedule appointment.

## 2016-02-20 ENCOUNTER — Ambulatory Visit: Payer: BLUE CROSS/BLUE SHIELD | Admitting: Adult Health

## 2016-02-26 ENCOUNTER — Ambulatory Visit: Payer: BLUE CROSS/BLUE SHIELD | Admitting: Adult Health

## 2016-05-06 ENCOUNTER — Other Ambulatory Visit: Payer: Self-pay

## 2016-05-06 MED ORDER — ATORVASTATIN CALCIUM 20 MG PO TABS: 20.0000 mg | ORAL_TABLET | Freq: Every day | ORAL | 3 refills | Status: DC

## 2016-05-06 NOTE — Telephone Encounter (Signed)
[facility]ill

## 2016-06-16 ENCOUNTER — Other Ambulatory Visit (HOSPITAL_COMMUNITY): Payer: Self-pay | Admitting: Internal Medicine

## 2016-06-16 DIAGNOSIS — Z1231 Encounter for screening mammogram for malignant neoplasm of breast: Secondary | ICD-10-CM

## 2016-06-23 ENCOUNTER — Ambulatory Visit (HOSPITAL_COMMUNITY): Payer: BLUE CROSS/BLUE SHIELD

## 2016-06-25 ENCOUNTER — Ambulatory Visit: Payer: BLUE CROSS/BLUE SHIELD | Admitting: Obstetrics and Gynecology

## 2016-06-30 ENCOUNTER — Ambulatory Visit (HOSPITAL_COMMUNITY): Payer: BLUE CROSS/BLUE SHIELD

## 2016-06-30 ENCOUNTER — Ambulatory Visit (HOSPITAL_COMMUNITY)
Admission: RE | Admit: 2016-06-30 | Discharge: 2016-06-30 | Disposition: A | Discharge status: Home / Self Care | Payer: BLUE CROSS/BLUE SHIELD | Source: Ambulatory Visit | Attending: Internal Medicine | Admitting: Internal Medicine

## 2016-06-30 DIAGNOSIS — Z1231 Encounter for screening mammogram for malignant neoplasm of breast: Secondary | ICD-10-CM | POA: Diagnosis present

## 2016-07-09 ENCOUNTER — Other Ambulatory Visit: Payer: BLUE CROSS/BLUE SHIELD | Admitting: Obstetrics and Gynecology

## 2016-07-23 ENCOUNTER — Other Ambulatory Visit: Payer: BLUE CROSS/BLUE SHIELD | Admitting: Obstetrics and Gynecology

## 2016-08-05 ENCOUNTER — Telehealth: Payer: Self-pay

## 2016-08-15 ENCOUNTER — Other Ambulatory Visit: Payer: Self-pay | Admitting: Internal Medicine

## 2016-08-15 DIAGNOSIS — N63 Unspecified lump in unspecified breast: Secondary | ICD-10-CM

## 2016-08-18 NOTE — Telephone Encounter (Signed)
Gastroenterology Pre-Procedure Review  Request Date: 08/05/2016 Requesting Physician: Dr. Legrand Rams  PATIENT REVIEW QUESTIONS: The patient responded to the following health history questions as indicated:    This will be pt's first colonoscopy Said she has an umbilical hernia  1. Diabetes Melitis: no 2. Joint replacements in the past 12 months: no 3. Major health problems in the past 3 months: no 4. Has an artificial valve or MVP: no 5. Has a defibrillator: no 6. Has been advised in past to take antibiotics in advance of a procedure like teeth cleaning: no 7. Family history of colon cancer: no  8. Alcohol Use: no 9. History of sleep apnea: no  10. History of coronary artery or other vascular stents placed within the last 12 months: no    MEDICATIONS & ALLERGIES:    Patient reports the following regarding taking any blood thinners:   Plavix? no Aspirin? no Coumadin? no Brilinta? no Xarelto? no Eliquis? no Pradaxa? no Savaysa? no Effient? no  Patient confirms/reports the following medications:  Current Outpatient Prescriptions  Medication Sig Dispense Refill  . aspirin EC 81 MG tablet Take 1 tablet (81 mg total) by mouth daily.    Marland Kitchen atorvastatin (LIPITOR) 20 MG tablet Take 1 tablet (20 mg total) by mouth daily. 90 tablet 3  . lisinopril-hydrochlorothiazide (PRINZIDE,ZESTORETIC) 20-25 MG tablet Take 1 tablet by mouth daily.  3  . metoprolol tartrate (LOPRESSOR) 25 MG tablet TAKE 1 & 1/2 TABLETS BY MOUTH TWICE A DAY 270 tablet 3  . Multiple Vitamin (MULITIVITAMIN WITH MINERALS) TABS Take 1 tablet by mouth daily. Reported on 04/23/2015     No current facility-administered medications for this visit.     Patient confirms/reports the following allergies:  Allergies  Allergen Reactions  . Morphine And Related Rash  . Penicillins Rash    No orders of the defined types were placed in this encounter.   AUTHORIZATION INFORMATION Primary Insurance:  ID #:   Group #:  Pre-Cert /  Auth required:  Pre-Cert / Auth #:   Secondary Insurance:   ID #:  Group #:  Pre-Cert / Auth required:  Pre-Cert / Auth #:   SCHEDULE INFORMATION: Procedure has been scheduled as follows:  Date:                 Time:  Location:   This Gastroenterology Pre-Precedure Review Form is being routed to the following provider(s): Barney Drain, MD

## 2016-08-19 ENCOUNTER — Other Ambulatory Visit: Payer: BLUE CROSS/BLUE SHIELD

## 2016-08-21 NOTE — Telephone Encounter (Signed)
SUPREP SPLIT DOSING-REGULAR breakfast then CLEAR LIQUIDS after 9 am.  Hold PRINIZIDE ON DAY OF TCS.

## 2016-08-22 ENCOUNTER — Other Ambulatory Visit: Payer: BLUE CROSS/BLUE SHIELD

## 2016-08-25 ENCOUNTER — Other Ambulatory Visit: Payer: Self-pay

## 2016-08-25 DIAGNOSIS — Z1211 Encounter for screening for malignant neoplasm of colon: Secondary | ICD-10-CM

## 2016-08-25 MED ORDER — NA SULFATE-K SULFATE-MG SULF 17.5-3.13-1.6 GM/177ML PO SOLN: 1.0000 | ORAL | 0 refills | Status: DC

## 2016-08-25 NOTE — Telephone Encounter (Signed)
Rx sent to the pharmacy and instructions mailed to pt.  

## 2016-08-28 ENCOUNTER — Telehealth: Payer: Self-pay | Admitting: Gastroenterology

## 2016-08-28 NOTE — Telephone Encounter (Signed)
LMOM to call.

## 2016-08-28 NOTE — Telephone Encounter (Signed)
Pt called to say that she was scheduled to come in on July 30. I told her this was her colonoscopy date not an OV. Pt is upset that she wasn't able to see or speak to a doctor first prior to scheduling her procedure. She said that she has a hernia and other medical issues (not GI issues) and didn't understand why we would schedule someone a procedure without seeing the patient first. I explained to her how the triages are handled and if she had any GI issues, hx of polyps, hx of colon cancer then she would've been scheduled an OV first. Pt said that she isn't comfortable with how we handle things and maybe she go elsewhere. I told her that was her choice. Pt would like to speak with DS ASAP. [phone]

## 2016-08-28 NOTE — Telephone Encounter (Signed)
I spoke to the pt. She is aware that Dr. Oneida Alar did not require an office visit just because of the hernia for her to have the colonoscopy.   I told her we would be more than happy to schedule her an office visit if she would like, we were just trying to eliminate a bill if possible.  She asked me more questions about the procedure and I went over the plan again.  She said she feels ok about it now. She said OK to proceed as planned.   She thinks she has received the instructions, she will look and see and if not she will let me know.

## 2016-08-28 NOTE — Telephone Encounter (Signed)
LMOM for a return call and that we will be glad to schedule her an appt if she would like to come in the office first.

## 2016-08-29 ENCOUNTER — Ambulatory Visit
Admission: RE | Admit: 2016-08-29 | Discharge: 2016-08-29 | Disposition: A | Discharge status: Home / Self Care | Payer: BLUE CROSS/BLUE SHIELD | Source: Ambulatory Visit | Attending: Internal Medicine | Admitting: Internal Medicine

## 2016-08-29 DIAGNOSIS — N63 Unspecified lump in unspecified breast: Secondary | ICD-10-CM

## 2016-08-29 NOTE — Telephone Encounter (Signed)
REVIEWED-NO ADDITIONAL RECOMMENDATIONS. 

## 2016-09-01 NOTE — Telephone Encounter (Signed)
No PA is needed for TCS 

## 2016-09-03 NOTE — Telephone Encounter (Signed)
Pt called and said she is having a feeling of something in her throat sometimes. She has spoken to her PCP and he told her to wait until after her colonoscopy to worry about this. She called and at first wanted to cancel the colonoscopy on 09/22/2016. But when she found out the [place] would be late August, she decided to continue with colonoscopy as planned.  She has been scheduled with Walden Field, NP for an OV appt on 10/21/2016 at 11:00 AM. At that time, if she proceeds with the colonoscopy, she will be an established pt.  She had not received the instructions for the colonoscopy, so I am mailing another copy.  Sending FYI to Dr. Oneida Alar.

## 2016-09-10 NOTE — Telephone Encounter (Signed)
REVIEWED. PT WILL NEED AN OPV PRIOR TO UPPER ENDOSCOPY.

## 2016-09-18 ENCOUNTER — Emergency Department (HOSPITAL_COMMUNITY)
Admission: EM | Admit: 2016-09-18 | Discharge: 2016-09-18 | Disposition: A | Discharge status: Home / Self Care | Payer: BLUE CROSS/BLUE SHIELD | Attending: Emergency Medicine | Admitting: Emergency Medicine

## 2016-09-18 ENCOUNTER — Emergency Department (HOSPITAL_COMMUNITY): Payer: BLUE CROSS/BLUE SHIELD

## 2016-09-18 ENCOUNTER — Other Ambulatory Visit: Payer: Self-pay

## 2016-09-18 ENCOUNTER — Encounter (HOSPITAL_COMMUNITY): Payer: Self-pay

## 2016-09-18 DIAGNOSIS — R0789 Other chest pain: Secondary | ICD-10-CM | POA: Diagnosis not present

## 2016-09-18 DIAGNOSIS — Z7982 Long term (current) use of aspirin: Secondary | ICD-10-CM | POA: Insufficient documentation

## 2016-09-18 DIAGNOSIS — I10 Essential (primary) hypertension: Secondary | ICD-10-CM | POA: Diagnosis not present

## 2016-09-18 DIAGNOSIS — R079 Chest pain, unspecified: Secondary | ICD-10-CM | POA: Diagnosis present

## 2016-09-18 DIAGNOSIS — J45909 Unspecified asthma, uncomplicated: Secondary | ICD-10-CM | POA: Insufficient documentation

## 2016-09-18 DIAGNOSIS — Z951 Presence of aortocoronary bypass graft: Secondary | ICD-10-CM | POA: Insufficient documentation

## 2016-09-18 DIAGNOSIS — Z79899 Other long term (current) drug therapy: Secondary | ICD-10-CM | POA: Insufficient documentation

## 2016-09-18 LAB — BASIC METABOLIC PANEL
Anion gap: 12 (ref 5–15)
BUN: 11 mg/dL (ref 6–20)
CALCIUM: 9.1 mg/dL (ref 8.9–10.3)
CO2: 24 mmol/L (ref 22–32)
Chloride: 101 mmol/L (ref 101–111)
Creatinine, Ser: 1.14 mg/dL — ABNORMAL HIGH (ref 0.44–1.00)
GFR, EST NON AFRICAN AMERICAN: 55 mL/min — AB (ref >60)
Glucose, Bld: 119 mg/dL — ABNORMAL HIGH (ref 65–99)
Potassium: 3.5 mmol/L (ref 3.5–5.1)
SODIUM: 137 mmol/L (ref 135–145)

## 2016-09-18 LAB — CBC
HCT: 37.6 % (ref 36.0–46.0)
Hemoglobin: 13 g/dL (ref 12.0–15.0)
MCH: 30.2 pg (ref 26.0–34.0)
MCHC: 34.6 g/dL (ref 30.0–36.0)
MCV: 87.2 fL (ref 78.0–100.0)
PLATELETS: 264 10*3/uL (ref 150–400)
RBC: 4.31 MIL/uL (ref 3.87–5.11)
RDW: 12.4 % (ref 11.5–15.5)
WBC: 3.7 10*3/uL — AB (ref 4.0–10.5)

## 2016-09-18 LAB — I-STAT TROPONIN, ED
TROPONIN I, POC: 0 ng/mL (ref 0.00–0.08)
Troponin i, poc: 0.02 ng/mL (ref 0.00–0.08)

## 2016-09-18 NOTE — Discharge Instructions (Signed)
Please see your primary care provider as soon as possible for further evaluation and treatment. Please also follow-up with cardiology further evaluation. Please return to the emergency department if you develop any new or worsening symptoms.

## 2016-09-18 NOTE — ED Triage Notes (Signed)
Pt presents for evaluation of L sided chest pressure, SOB, lightheadedness, and nausea while sitting at desk at work today. Pt reports hx of CABG in 2012. Pt denies CP on arrival to ED, reports feels anxious and diaphoretic.

## 2016-09-18 NOTE — ED Provider Notes (Signed)
[facility] DEPT Provider Note   CSN: [id number] Arrival date & time: 09/18/16  1442     History   Chief Complaint Chief Complaint  Patient presents with  . Chest Pain    HPI Ashlee Stein is a 51 y.o. female with history of coronary artery dissection in 2012 status post CABG who presents following an episode of chest pain and shortness of breath. Patient reports she was sitting at her desk at work when she had a left-sided chest pain radiating to her left arm. She had associated nausea, lightheadedness, shortness of breath, and difficulty swallowing. Patient denies feeling anxious prior to episode, however she does state she has been under more stress lately. Patient notes that she has an umbilical hernia, but denies pain to the area. Patient does note that she was feeling some epigastric pain yesterday, and wondered if her symptoms were due to reflux. Patient's symptoms had returned to baseline, however patient states she did have over return of feeling "not right" after walking from the waiting room. Patient has returned to baseline on my exam. She denies any recent long trips, surgeries, cancer, new leg pain or swelling. No medications taken PTA.  HPI  Past Medical History:  Diagnosis Date  . Angina   . Asthma    "as a child"  . Coronary artery dissection    NSTEMI 8/12: cath with mid to dist LAD spont. dissection, unable to do PCI and referred for CABG:  SVG to LAD (Dr. PVT);  Carotid U/S 6/14:  Normal carotid arteries; FMD could not be ruled out  . Coronary artery dissection 10/24/2010  . Dyslipidemia   . Heart disease   . Hypertension   . Iron deficiency anemia   . Obesity     Patient Active Problem List   Diagnosis Date Noted  . Morbid obesity ([place]) 08/24/2015  . Dyspareunia 08/29/2013  . Umbilical hernia 42/70/6237  . Menorrhagia 06/29/2011  . Anemia 01/07/2011  . Cough 12/06/2010  . Chest pain 11/14/2010  . Coronary artery dissection   . Dyslipidemia   .  Hypertension     Past Surgical History:  Procedure Laterality Date  . CARDIAC SURGERY     open heart surgery  . CERVICAL POLYPECTOMY  07/01/2011   Procedure: CERVICAL POLYPECTOMY;  Surgeon: Jonnie Kind, MD;  Location: [facility];  Service: Gynecology;  Laterality: N/A;  Endometrial Polypectomy  . CORONARY ARTERY BYPASS GRAFT  10/24/2010   CABG X1:  coronary artery dissection -->CABG:  SVG to LAD (Dr. PVT)  . DILATION AND CURETTAGE OF UTERUS    . HYSTEROSCOPY W/ ENDOMETRIAL ABLATION    . LAPAROSCOPIC TUBAL LIGATION  07/01/2011   Procedure: LAPAROSCOPIC TUBAL LIGATION;  Surgeon: Jonnie Kind, MD;  Location: [facility];  Service: Gynecology;  Laterality: Bilateral;  Laparoscopic Bilateral Tubal Sterilization with Fallope Rings; ended at 1143  . UMBILICAL HERNIA REPAIR  07/01/2011   Procedure: HERNIA REPAIR UMBILICAL ADULT;  Surgeon: Jonnie Kind, MD;  Location: [facility];  Service: Gynecology;  Laterality: N/A;  during Laparoscopic Bilateral Tubal Sterilization    OB History    No data available       Home Medications    Prior to Admission medications   Medication Sig Start Date End Date Taking? Authorizing Provider  aspirin EC 81 MG tablet Take 1 tablet (81 mg total) by mouth daily. 12/06/10  Yes Hillary Bow, MD  atorvastatin (LIPITOR) 20 MG tablet Take 1 tablet (20 mg total) by mouth daily. 05/06/16  Yes Herminio Commons, MD  lisinopril-hydrochlorothiazide (PRINZIDE,ZESTORETIC) 20-12.5 MG tablet Take 1 tablet by mouth 2 (two) times daily.  01/14/15  Yes [provider]  metoprolol tartrate (LOPRESSOR) 25 MG tablet TAKE 1 & 1/2 TABLETS BY MOUTH TWICE A DAY Patient taking differently: Take 37.5 mg by mouth 2 (two) times daily. TAKE 1 & 1/2 TABLETS BY MOUTH TWICE A DAY 09/20/15  Yes Herminio Commons, MD  Multiple Vitamin (MULITIVITAMIN WITH MINERALS) TABS Take 1 tablet by mouth daily. Reported on 04/23/2015   Yes [provider]  omeprazole (PRILOSEC) 40 MG capsule  Take 40 mg by mouth daily. 09/03/16  Yes [provider]  Na Sulfate-K Sulfate-Mg Sulf (SUPREP BOWEL PREP KIT) 17.5-3.13-1.6 GM/180ML SOLN Take 1 kit by mouth as directed. 08/25/16   Danie Binder, MD    Family History Family History  Problem Relation Age of Onset  . Heart attack Sister   . Diabetes Mother   . Hypertension Mother   . Heart disease Mother   . Cancer Brother 50       prostate  . Diabetes Sister   . Hypertension Sister   . Hypertension Sister     Social History Social History  Substance Use Topics  . Smoking status: Never Smoker  . Smokeless tobacco: Never Used  . Alcohol use No     Allergies   Morphine and related and Penicillins   Review of Systems Review of Systems  Constitutional: Positive for diaphoresis. Negative for chills and fever.  HENT: Negative for facial swelling and sore throat.   Respiratory: Positive for shortness of breath.   Cardiovascular: Positive for chest pain.  Gastrointestinal: Positive for nausea. Negative for abdominal pain and vomiting.  Genitourinary: Negative for dysuria.  Musculoskeletal: Negative for back pain.  Skin: Negative for rash and wound.  Neurological: Positive for light-headedness. Negative for headaches.  Psychiatric/Behavioral: The patient is not nervous/anxious.      Physical Exam Updated Vital Signs BP (!) 125/91   Pulse 64   Temp 98 F (36.7 C) (Oral)   Resp 15   Ht 5' 7"  (1.702 m)   Wt 122.5 kg (270 lb)   LMP 09/15/2016 (Exact Date)   SpO2 99%   BMI 42.29 kg/m   Physical Exam  Constitutional: She appears well-developed and well-nourished. No distress.  HENT:  Head: Normocephalic and atraumatic.  Mouth/Throat: Oropharynx is clear and moist. No oropharyngeal exudate.  Eyes: Pupils are equal, round, and reactive to light. Conjunctivae are normal. Right eye exhibits no discharge. Left eye exhibits no discharge. No scleral icterus.  Neck: Normal range of motion. Neck supple. No  thyromegaly present.  Cardiovascular: Normal rate, regular rhythm, normal heart sounds and intact distal pulses.  Exam reveals no gallop and no friction rub.   No murmur heard. Pulmonary/Chest: Effort normal and breath sounds normal. No stridor. No respiratory distress. She has no wheezes. She has no rales. She exhibits no tenderness.  Abdominal: Soft. Bowel sounds are normal. She exhibits no distension. There is no tenderness. There is no rebound and no guarding. A hernia (nontender, skin-colored, periumbilical) is present.  Musculoskeletal: She exhibits no edema.  Lymphadenopathy:    She has no cervical adenopathy.  Neurological: She is alert. Coordination normal.  Skin: Skin is warm and dry. No rash noted. She is not diaphoretic. No pallor.  Psychiatric: She has a normal mood and affect.  Nursing note and vitals reviewed.    ED Treatments / Results  Labs (all labs ordered  are listed, but only abnormal results are displayed) Labs Reviewed  BASIC METABOLIC PANEL - Abnormal; Notable for the following:       Result Value   Glucose, Bld 119 (*)    Creatinine, Ser 1.14 (*)    GFR calc non Af Amer 55 (*)    All other components within normal limits  CBC - Abnormal; Notable for the following:    WBC 3.7 (*)    All other components within normal limits  I-STAT TROPONIN, ED  I-STAT TROPONIN, ED    EKG  EKG Interpretation  Date/Time:  Thursday September 18 2016 14:49:50 EDT Ventricular Rate:  77 PR Interval:  170 QRS Duration: 86 QT Interval:  410 QTC Calculation: 463 R Axis:   -8 Text Interpretation:  Sinus rhythm with occasional Premature ventricular complexes Possible Left atrial enlargement Left ventricular hypertrophy Nonspecific T wave abnormality Prolonged QT Abnormal ECG Confirmed by Tanna Furry [phone] on 09/18/2016 7:56:07 PM       Radiology Dg Chest 2 View  Result Date: 09/18/2016 CLINICAL DATA:  Chest pressure upper chest. EXAM: CHEST  2 VIEW COMPARISON:  CT  03/04/2014.  Chest x-ray 03/04/2014. FINDINGS: Prior CABG. Cardiomegaly with normal pulmonary vascularity. No focal infiltrate. No pleural effusion or pneumothorax. Diffuse degenerative change thoracic spine. No acute bony abnormality identified. IMPRESSION: Prior CABG. Cardiomegaly. No pulmonary venous congestion. No focal infiltrate. Electronically Signed   By: Marcello Moores  Register   On: 09/18/2016 15:43    Procedures Procedures (including critical care time)  Medications Ordered in ED Medications - No data to display   Initial Impression / Assessment and Plan / ED Course  I have reviewed the triage vital signs and the nursing notes.  Pertinent labs & imaging results that were available during my care of the patient were reviewed by me and considered in my medical decision making (see chart for details).     CBC, BMP is reassuring. Delta troponin is negative. CXR shows prior CABG, cardiomegaly, no pulmonary venous congestion or focal infiltrate. Doubt PE or ACS at this time. EKG is reassuring and shows no ischemic changes since last tracing. Considering patient is back to baseline without intervention, myself and attending, Dr. Jeneen Rinks, feel patient is safe for follow-up to PCP. Patient advised to return if any worsening symptoms develop. Patient understands and agrees with plan. Patient vitals stable throughout ED course and discharged in satisfactory condition. Patient also evaluated by Dr. Jeneen Rinks who guided the patient's management and agrees with plan.  2010 Upon discharge and getting in the wheelchair, patient did have some mild substernal chest pain being to her back, however patient is comfortable going home and would like to follow up with her primary care provider tomorrow. I advised patient to return if anything changed or worsened in her symptoms persist. She understands and agrees with plan.  Final Clinical Impressions(s) / ED Diagnoses   Final diagnoses:  Atypical chest pain    New  Prescriptions New Prescriptions   No medications on file         Caryl Ada 09/18/16 2011    Tanna Furry, MD 09/29/16 [phone]

## 2016-09-19 NOTE — OR Nursing (Signed)
Ashlee Stein called to cancel her colonoscopy appointment for 09-22-2016 stating that she was not comfortable having the procedure since she had not been able to talk with the doctor prior to the procedure.  She had expressed her concerns to the office staff and was not satisfied with just a nurse calling her back.  She also said that she did not like the tone in which the staff spoke to her.   She has reached out to her PCP to let him know how uncomfortable she was with this arrangement and he will be referring her to another physician and practice.

## 2016-09-22 ENCOUNTER — Ambulatory Visit (HOSPITAL_COMMUNITY)
Admission: RE | Admit: 2016-09-22 | Payer: BLUE CROSS/BLUE SHIELD | Source: Ambulatory Visit | Admitting: Gastroenterology

## 2016-09-22 ENCOUNTER — Encounter (HOSPITAL_COMMUNITY): Admission: RE | Payer: Self-pay | Source: Ambulatory Visit

## 2016-09-22 SURGERY — COLONOSCOPY
Anesthesia: Moderate Sedation

## 2016-10-21 ENCOUNTER — Ambulatory Visit: Payer: BLUE CROSS/BLUE SHIELD | Admitting: Nurse Practitioner

## 2016-11-14 ENCOUNTER — Other Ambulatory Visit: Payer: BLUE CROSS/BLUE SHIELD | Admitting: Obstetrics and Gynecology

## 2016-11-20 ENCOUNTER — Other Ambulatory Visit (HOSPITAL_COMMUNITY)
Admission: RE | Admit: 2016-11-20 | Discharge: 2016-11-20 | Disposition: A | Discharge status: Home / Self Care | Payer: BLUE CROSS/BLUE SHIELD | Source: Ambulatory Visit | Attending: Obstetrics and Gynecology | Admitting: Obstetrics and Gynecology

## 2016-11-20 ENCOUNTER — Encounter: Payer: Self-pay | Admitting: Obstetrics and Gynecology

## 2016-11-20 ENCOUNTER — Ambulatory Visit (INDEPENDENT_AMBULATORY_CARE_PROVIDER_SITE_OTHER): Payer: BLUE CROSS/BLUE SHIELD | Admitting: Obstetrics and Gynecology

## 2016-11-20 VITALS — BP 100/60 | HR 72 | Ht 67.0 in | Wt 272.8 lb

## 2016-11-20 DIAGNOSIS — D259 Leiomyoma of uterus, unspecified: Secondary | ICD-10-CM

## 2016-11-20 DIAGNOSIS — Z01419 Encounter for gynecological examination (general) (routine) without abnormal findings: Secondary | ICD-10-CM | POA: Insufficient documentation

## 2016-11-20 DIAGNOSIS — Z01411 Encounter for gynecological examination (general) (routine) with abnormal findings: Secondary | ICD-10-CM | POA: Diagnosis not present

## 2016-11-20 DIAGNOSIS — Q7959 Other congenital malformations of abdominal wall: Secondary | ICD-10-CM

## 2016-11-20 DIAGNOSIS — Z8679 Personal history of other diseases of the circulatory system: Secondary | ICD-10-CM

## 2016-11-20 DIAGNOSIS — K439 Ventral hernia without obstruction or gangrene: Secondary | ICD-10-CM | POA: Diagnosis not present

## 2016-11-20 NOTE — Patient Instructions (Signed)

## 2016-11-20 NOTE — Progress Notes (Signed)
Patient ID: Ashlee Stein, female   DOB: [birth year], 51 y.o.   MRN: [id number]   Assessment:  1. Uterine fibroid 18 week size, symptomatic 2. Abdominal rectus diastalsis with ventral hernia above umbilicus 3. Hx of cardiac bypass surgery 4. Desired for hysterectomy Plan:  1. Requesting surpracervical hysterectomy  2. Would prefer early december OR time 3. Abdominal ventral hernia, forward/rectus diastalsis Advised to go see Dr. Arnoldo Morale 4. Will need cardio clearance due hx of bypass surgery 2012 5. Consider Lupron to shrink uterus 6. 20 pound weight loss before surgery, recommended  7. F/u 4 weeksFor further pre op discussion  Subjective:  Ashlee Stein is a 51 y.o. female G2P0 who presents for annual exam. Patient's last menstrual period was 11/07/2016. Body mass index is 42.73 kg/m. The patient has complaints today of the fibroid pressing on her bladder. Pt would like a hysterectomy. Pt denies any other complaints or sx at this time.   The following portions of the patient's history were reviewed and updated as appropriate: allergies, current medications, past family history, past medical history, past social history, past surgical history and problem list. Past Medical History:  Diagnosis Date  . Angina   . Asthma    "as a child"  . Coronary artery dissection    NSTEMI 8/12: cath with mid to dist LAD spont. dissection, unable to do PCI and referred for CABG:  SVG to LAD (Dr. PVT);  Carotid U/S 6/14:  Normal carotid arteries; FMD could not be ruled out  . Coronary artery dissection 10/24/2010  . Dyslipidemia   . Heart disease   . Hypertension   . Iron deficiency anemia   . Obesity     Past Surgical History:  Procedure Laterality Date  . CARDIAC SURGERY     open heart surgery  . CERVICAL POLYPECTOMY  07/01/2011   Procedure: CERVICAL POLYPECTOMY;  Surgeon: Jonnie Kind, MD;  Location: [facility];  Service: Gynecology;  Laterality: N/A;  Endometrial Polypectomy  . CORONARY ARTERY  BYPASS GRAFT  10/24/2010   CABG X1:  coronary artery dissection -->CABG:  SVG to LAD (Dr. PVT)  . DILATION AND CURETTAGE OF UTERUS    . HYSTEROSCOPY W/ ENDOMETRIAL ABLATION    . LAPAROSCOPIC TUBAL LIGATION  07/01/2011   Procedure: LAPAROSCOPIC TUBAL LIGATION;  Surgeon: Jonnie Kind, MD;  Location: [facility];  Service: Gynecology;  Laterality: Bilateral;  Laparoscopic Bilateral Tubal Sterilization with Fallope Rings; ended at 1143  . UMBILICAL HERNIA REPAIR  07/01/2011   Procedure: HERNIA REPAIR UMBILICAL ADULT;  Surgeon: Jonnie Kind, MD;  Location: [facility];  Service: Gynecology;  Laterality: N/A;  during Laparoscopic Bilateral Tubal Sterilization     Current Outpatient Prescriptions:  .  aspirin EC 81 MG tablet, Take 1 tablet (81 mg total) by mouth daily., Disp: , Rfl:  .  atorvastatin (LIPITOR) 20 MG tablet, Take 1 tablet (20 mg total) by mouth daily., Disp: 90 tablet, Rfl: 3 .  lisinopril-hydrochlorothiazide (PRINZIDE,ZESTORETIC) 20-12.5 MG tablet, Take 1 tablet by mouth 2 (two) times daily. , Disp: , Rfl: 3 .  metoprolol tartrate (LOPRESSOR) 25 MG tablet, TAKE 1 & 1/2 TABLETS BY MOUTH TWICE A DAY (Patient taking differently: Take 37.5 mg by mouth 2 (two) times daily. TAKE 1 & 1/2 TABLETS BY MOUTH TWICE A DAY), Disp: 270 tablet, Rfl: 3 .  Multiple Vitamin (MULITIVITAMIN WITH MINERALS) TABS, Take 1 tablet by mouth daily. Reported on 04/23/2015, Disp: , Rfl:  .  omeprazole (PRILOSEC) 40 MG capsule, Take 40  mg by mouth daily., Disp: , Rfl: 3 .  Na Sulfate-K Sulfate-Mg Sulf (SUPREP BOWEL PREP KIT) 17.5-3.13-1.6 GM/180ML SOLN, Take 1 kit by mouth as directed. (Patient not taking: Reported on 11/20/2016), Disp: 1 Bottle, Rfl: 0  Review of Systems Constitutional: negative Gastrointestinal: negative Genitourinary: negative  Objective:  BP 100/60   Pulse 72   Ht 5' 7"  (1.702 m)   Wt 272 lb 12.8 oz (123.7 kg)   LMP 11/07/2016   BMI 42.73 kg/m    BMI: Body mass index is 42.73 kg/m.  General  Appearance: Alert, appropriate appearance for age. No acute distress. Obese.  HEENT: Grossly normal Neck / Thyroid:  Cardiovascular: RRR; normal S1, S2, no murmur Lungs: CTA bilaterally Back: No CVAT Breast Exam:normal, no identifiable masses today, full mobile, nontender Gastrointestinal: Soft, non-tender, no masses or organomegaly Pelvic Exam: External genitalia: normal general appearance Vaginal: good support Cervix: well supported, large, multiparus Uterus: large mobile, 18 weeks  Rectovaginal: not indicated Abdomen: laxity, fibroid: 18 weeks in size, rectus diastalsis above naval Lymphatic Exam: Non-palpable nodes in neck, clavicular, axillary, or inguinal regions  Skin: no rash or abnormalities Neurologic: Normal gait and speech, no tremor  Psychiatric: Alert and oriented, appropriate affect.  Urinalysis:Not done  Mallory Shirk. MD Pgr [phone] 11:43 AM   By signing my name below, I, Margit Banda, attest that this documentation has been prepared under the direction and in the presence of Jonnie Kind, MD. Electronically Signed: Margit Banda, Medical Scribe. 11/20/16. 11:43 AM.  I personally performed the services described in this documentation, which was SCRIBED in my presence. The recorded information has been reviewed and considered accurate. It has been edited as necessary during review. Jonnie Kind, MD

## 2016-11-21 ENCOUNTER — Ambulatory Visit: Payer: BLUE CROSS/BLUE SHIELD | Admitting: Cardiovascular Disease

## 2016-11-21 ENCOUNTER — Other Ambulatory Visit: Payer: BLUE CROSS/BLUE SHIELD | Admitting: Obstetrics and Gynecology

## 2016-11-24 LAB — CYTOLOGY - PAP
Diagnosis: NEGATIVE
HPV: NOT DETECTED

## 2016-12-04 ENCOUNTER — Ambulatory Visit: Payer: BLUE CROSS/BLUE SHIELD | Admitting: Cardiovascular Disease

## 2016-12-15 ENCOUNTER — Encounter: Payer: BLUE CROSS/BLUE SHIELD | Admitting: Obstetrics and Gynecology

## 2016-12-26 ENCOUNTER — Ambulatory Visit (INDEPENDENT_AMBULATORY_CARE_PROVIDER_SITE_OTHER): Payer: BLUE CROSS/BLUE SHIELD | Admitting: Cardiovascular Disease

## 2016-12-26 ENCOUNTER — Encounter: Payer: Self-pay | Admitting: Cardiovascular Disease

## 2016-12-26 VITALS — BP 118/78 | HR 74 | Ht 67.0 in | Wt 271.0 lb

## 2016-12-26 DIAGNOSIS — Z01818 Encounter for other preprocedural examination: Secondary | ICD-10-CM | POA: Diagnosis not present

## 2016-12-26 DIAGNOSIS — I10 Essential (primary) hypertension: Secondary | ICD-10-CM

## 2016-12-26 DIAGNOSIS — Z9289 Personal history of other medical treatment: Secondary | ICD-10-CM

## 2016-12-26 DIAGNOSIS — E785 Hyperlipidemia, unspecified: Secondary | ICD-10-CM

## 2016-12-26 DIAGNOSIS — I25708 Atherosclerosis of coronary artery bypass graft(s), unspecified, with other forms of angina pectoris: Secondary | ICD-10-CM | POA: Diagnosis not present

## 2016-12-26 NOTE — Patient Instructions (Signed)
Your physician wants you to follow-up in:  6 months with Dr.Koneswaran You will receive a reminder letter in the mail two months in advance. If you don't receive a letter, please call our office to schedule the follow-up appointment.   Your physician recommends that you continue on your current medications as directed. Please refer to the Current Medication list given to you today.    If you need a refill on your cardiac medications before your next appointment, please call your pharmacy.     Your physician has requested that you have en exercise stress myoview. For further information please visit [email or web address]. Please follow instruction sheet, as given.     No lab work ordered today.         Thank you for choosing [facility] !

## 2016-12-26 NOTE — Progress Notes (Signed)
SUBJECTIVE: The patient returns for routine follow-up. She has a history of coronary artery disease and underwent 1-vessel CABG (SVG to LAD) in August 2012.  She also has a history of hypertension.  Most recent echocardiogram I find is dated 11/21/10 and showed normal left ventricular systolic function and regional wall motion, LVEF 60-65%, mild mitral regurgitation, mild biatrial dilatation, mild right ventricular dilatation, and moderate tricuspid regurgitation, PA pressure 34 mmHg.  She recently saw her gynecologist who is planning to do a supracervical hysterectomy at some point in December.  She also has an abdominal ventral hernia and plans to have this repaired as well.  20 pound weight loss before surgery was recommended.  She denies exertional chest pain and shortness of breath.  She also denies palpitations, leg swelling, orthopnea, and paroxysmal nocturnal dyspnea.  She was evaluated for chest pain in the ED on 09/18/16.  I reviewed all documentation, labs, and studies.  Troponins were normal.  Hemoglobin was normal at 13.  Creatinine was mildly elevated at 1.14.  Potassium was low normal at 3.5.  Chest x-ray did not demonstrate any evidence of acute cardiopulmonary disease.  I personally reviewed the ECG which demonstrated sinus rhythm with PVCs, probable LVH, and nonspecific T wave abnormalities.  Soc Hx: Married. 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 Lives in [place]. Has 2 children.   Review of Systems: As per "subjective", otherwise negative.  Allergies  Allergen Reactions  . Morphine And Related Rash  . Penicillins Rash    Has patient had a PCN reaction causing immediate rash, facial/tongue/throat swelling, SOB or lightheadedness with hypotension: yes Has patient had a PCN reaction causing severe rash involving mucus membranes or skin necrosis: no Has patient had a PCN reaction that required hospitalization: no Has patient had a  PCN reaction occurring within the last 10 years: no If all of the above answers are "NO", then may proceed with Cephalosporin use.     Current Outpatient Prescriptions  Medication Sig Dispense Refill  . aspirin EC 81 MG tablet Take 1 tablet (81 mg total) by mouth daily.    Marland Kitchen atorvastatin (LIPITOR) 20 MG tablet Take 1 tablet (20 mg total) by mouth daily. 90 tablet 3  . lisinopril-hydrochlorothiazide (PRINZIDE,ZESTORETIC) 20-12.5 MG tablet Take 1 tablet by mouth 2 (two) times daily.   3  . metoprolol tartrate (LOPRESSOR) 25 MG tablet TAKE 1 & 1/2 TABLETS BY MOUTH TWICE A DAY (Patient taking differently: Take 37.5 mg by mouth 2 (two) times daily. TAKE 1 & 1/2 TABLETS BY MOUTH TWICE A DAY) 270 tablet 3  . Multiple Vitamin (MULITIVITAMIN WITH MINERALS) TABS Take 1 tablet by mouth daily. Reported on 04/23/2015    . Na Sulfate-K Sulfate-Mg Sulf (SUPREP BOWEL PREP KIT) 17.5-3.13-1.6 GM/180ML SOLN Take 1 kit by mouth as directed. 1 Bottle 0  . omeprazole (PRILOSEC) 40 MG capsule Take 40 mg by mouth daily.  3   No current facility-administered medications for this visit.     Past Medical History:  Diagnosis Date  . Angina   . Asthma    "as a child"  . Coronary artery dissection    NSTEMI 8/12: cath with mid to dist LAD spont. dissection, unable to do PCI and referred for CABG:  SVG to LAD (Dr. PVT);  Carotid U/S 6/14:  Normal carotid arteries; FMD could not be ruled out  . Coronary artery dissection 10/24/2010  . Dyslipidemia   . Heart disease   . Hypertension   .  Iron deficiency anemia   . Obesity     Past Surgical History:  Procedure Laterality Date  . CARDIAC SURGERY     open heart surgery  . CERVICAL POLYPECTOMY  07/01/2011   Procedure: CERVICAL POLYPECTOMY;  Surgeon: Jonnie Kind, MD;  Location: [facility];  Service: Gynecology;  Laterality: N/A;  Endometrial Polypectomy  . CORONARY ARTERY BYPASS GRAFT  10/24/2010   CABG X1:  coronary artery dissection -->CABG:  SVG to LAD (Dr. PVT)  .  DILATION AND CURETTAGE OF UTERUS    . HYSTEROSCOPY W/ ENDOMETRIAL ABLATION    . LAPAROSCOPIC TUBAL LIGATION  07/01/2011   Procedure: LAPAROSCOPIC TUBAL LIGATION;  Surgeon: Jonnie Kind, MD;  Location: [facility];  Service: Gynecology;  Laterality: Bilateral;  Laparoscopic Bilateral Tubal Sterilization with Fallope Rings; ended at 1143  . UMBILICAL HERNIA REPAIR  07/01/2011   Procedure: HERNIA REPAIR UMBILICAL ADULT;  Surgeon: Jonnie Kind, MD;  Location: [facility];  Service: Gynecology;  Laterality: N/A;  during Laparoscopic Bilateral Tubal Sterilization    Social History   Social History  . Marital status: Married    Spouse name: N/A  . Number of children: N/A  . Years of education: N/A   Occupational History  . Not on file.   Social History Main Topics  . Smoking status: Never Smoker  . Smokeless tobacco: Never Used  . Alcohol use No  . Drug use: No  . Sexual activity: Yes    Birth control/ protection: Surgical   Other Topics Concern  . Not on file   Social History Narrative  . No narrative on file     Vitals:   12/26/16 1422  BP: 118/78  Pulse: 74  SpO2: 98%  Weight: 271 lb (122.9 kg)  Height: _0  (1.702 m)    Wt Readings from Last 3 Encounters:  12/26/16 271 lb (122.9 kg)  11/20/16 272 lb 12.8 oz (123.7 kg)  09/18/16 270 lb (122.5 kg)     PHYSICAL EXAM General: NAD HEENT: Normal. Neck: No JVD, no thyromegaly. Lungs: Clear to auscultation bilaterally with normal respiratory effort. CV: Regular rate and rhythm, normal S1/S2, no S3/S4, no murmur. No pretibial or periankle edema.  No carotid bruit.   Abdomen: Soft, nontender, no distention.  Neurologic: Alert and oriented.  Psych: Normal affect. Skin: Normal. Musculoskeletal: No gross deformities.    ECG: Most recent ECG reviewed.   Labs: Lab Results  Component Value Date/Time   K 3.5 09/18/2016 03:00 PM   BUN 11 09/18/2016 03:00 PM   CREATININE 1.14 (H) 09/18/2016 03:00 PM   ALT 17 03/04/2014  12:56 PM   HGB 13.0 09/18/2016 03:00 PM     Lipids: Lab Results  Component Value Date/Time   LDLCALC 105 (H) 08/04/2012 11:47 AM   LDLDIRECT 125.9 11/01/2012 10:59 AM   CHOL 202 (H) 11/01/2012 10:59 AM   TRIG 165.0 (H) 11/01/2012 10:59 AM   HDL 47.00 11/01/2012 10:59 AM       ASSESSMENT AND PLAN:  1. CAD/CABG: Stable ischemic heart disease. Continue aspirin 81 mg, Lipitor 20 mg, and metoprolol 37.5 mg twice daily.  Given the fact that she plans to undergo surgery as detailed above in December, I will obtain an exercise Myoview stress test for risk stratification purposes.  2. Essential HTN: Controlled. No changes.  3. Hyperlipidemia: I will obtain a copy of lipids. Continue Lipitor.  4.  Preoperative risk stratification: Continue metoprolol in the perioperative period to attenuate the risk for major adverse  cardiac events.  I will obtain an exercise Myoview stress test to further risk stratify.   Disposition: Follow up 6 months   Kate Sable, M.D., F.A.C.C.

## 2016-12-29 ENCOUNTER — Ambulatory Visit (INDEPENDENT_AMBULATORY_CARE_PROVIDER_SITE_OTHER): Payer: BLUE CROSS/BLUE SHIELD | Admitting: Obstetrics and Gynecology

## 2016-12-29 ENCOUNTER — Encounter: Payer: Self-pay | Admitting: Obstetrics and Gynecology

## 2016-12-29 VITALS — BP 142/80 | HR 75 | Ht 67.5 in | Wt 275.2 lb

## 2016-12-29 DIAGNOSIS — D259 Leiomyoma of uterus, unspecified: Secondary | ICD-10-CM | POA: Diagnosis not present

## 2016-12-29 DIAGNOSIS — Z01818 Encounter for other preprocedural examination: Secondary | ICD-10-CM

## 2016-12-29 DIAGNOSIS — Q7959 Other congenital malformations of abdominal wall: Secondary | ICD-10-CM

## 2016-12-29 DIAGNOSIS — K439 Ventral hernia without obstruction or gangrene: Secondary | ICD-10-CM | POA: Diagnosis not present

## 2016-12-29 NOTE — Progress Notes (Signed)
Patient ID: Ashlee Stein, female   DOB: 08-Aug-1965, 51 y.o.   MRN: [id number] Preoperative History and Physical  Anjelica Gorniak is a 51 y.o. G2P0 here for surgical management of symptomatic uterine fibroid and abdominal rectus diastalsis with ventral hernia above umbilicus. Pt reports pain with palpation. Last pap was 04/23/2015, normal with no HPV. She reports normal menstrual cycles. She adds no bleeding during or after intercourse. She does endorse dyspareunia. She denies any other symptoms or complaints at this time. No significant preoperative concerns.  Proposed surgery: Supracervical Hysterectomy  Past Medical History:  Diagnosis Date  . Angina   . Asthma    "as a child"  . Coronary artery dissection    NSTEMI 8/12: cath with mid to dist LAD spont. dissection, unable to do PCI and referred for CABG:  SVG to LAD (Dr. PVT);  Carotid U/S 6/14:  Normal carotid arteries; FMD could not be ruled out  . Coronary artery dissection 10/24/2010  . Dyslipidemia   . Heart disease   . Hypertension   . Iron deficiency anemia   . Obesity    Past Surgical History:  Procedure Laterality Date  . CARDIAC SURGERY     open heart surgery  . CORONARY ARTERY BYPASS GRAFT  10/24/2010   CABG X1:  coronary artery dissection -->CABG:  SVG to LAD (Dr. PVT)  . DILATION AND CURETTAGE OF UTERUS    . HYSTEROSCOPY W/ ENDOMETRIAL ABLATION     OB History  Gravida Para Term Preterm AB Living  2         2  SAB TAB Ectopic Multiple Live Births               # Outcome Date GA Lbr Len/2nd Weight Sex Delivery Anes PTL Lv  2 Gravida           1 Gravida             Patient denies any other pertinent gynecologic issues.   Current Outpatient Medications on File Prior to Visit  Medication Sig Dispense Refill  . aspirin EC 81 MG tablet Take 1 tablet (81 mg total) by mouth daily.    Marland Kitchen atorvastatin (LIPITOR) 20 MG tablet Take 1 tablet (20 mg total) by mouth daily. 90 tablet 3  . lisinopril-hydrochlorothiazide  (PRINZIDE,ZESTORETIC) 20-12.5 MG tablet Take 1 tablet by mouth 2 (two) times daily.   3  . metoprolol tartrate (LOPRESSOR) 25 MG tablet TAKE 1 & 1/2 TABLETS BY MOUTH TWICE A DAY (Patient taking differently: Take 37.5 mg by mouth 2 (two) times daily. TAKE 1 & 1/2 TABLETS BY MOUTH TWICE A DAY) 270 tablet 3  . Multiple Vitamin (MULITIVITAMIN WITH MINERALS) TABS Take 1 tablet daily by mouth. Every other day    . omeprazole (PRILOSEC) 40 MG capsule Take 40 mg as needed by mouth.   3  . Na Sulfate-K Sulfate-Mg Sulf (SUPREP BOWEL PREP KIT) 17.5-3.13-1.6 GM/180ML SOLN Take 1 kit by mouth as directed. 1 Bottle 0   No current facility-administered medications on file prior to visit.    Allergies  Allergen Reactions  . Morphine And Related Rash  . Penicillins Rash    Has patient had a PCN reaction causing immediate rash, facial/tongue/throat swelling, SOB or lightheadedness with hypotension: yes Has patient had a PCN reaction causing severe rash involving mucus membranes or skin necrosis: no Has patient had a PCN reaction that required hospitalization: no Has patient had a PCN reaction occurring within the last 10 years: no If all  of the above answers are "NO", then may proceed with Cephalosporin use.     Social History:   reports that  has never smoked. she has never used smokeless tobacco. She reports that she does not drink alcohol or use drugs.  Family History  Problem Relation Age of Onset  . Heart attack Sister   . Diabetes Mother   . Hypertension Mother   . Heart disease Mother   . Cancer Brother 50       prostate  . Diabetes Sister   . Hypertension Sister   . Hypertension Sister     Review of Systems: Noncontributory  PHYSICAL EXAM: Blood pressure (!) 142/80, pulse 75, height 5' 7.5" (1.715 m), weight 275 lb 3.2 oz (124.8 kg). General appearance - alert, well appearing, and in no distress Chest - clear to auscultation, no wheezes, rales or rhonchi, symmetric air entry Heart -  normal rate and regular rhythm Abdomen - soft, nontender, nondistended, no masses or organomegaly Pelvic - examination 20 week symptomatic uterine fibroid and abdominal rectus diastalsis with ventral hernia above umbilicus Extremities - peripheral pulses normal, no pedal edema, no clubbing or cyanosis  Labs: No results found for this or any previous visit (from the past 336 hour(s)).  Imaging Studies: No results found.  Assessment: Patient Active Problem List   Diagnosis Date Noted  . Morbid obesity ([place]) 08/24/2015  . Dyspareunia 08/29/2013  . Umbilical hernia 69/62/9528  . Menorrhagia 06/29/2011  . Anemia 01/07/2011  . Cough 12/06/2010  . Chest pain 11/14/2010  . Coronary artery dissection   . Dyslipidemia   . Hypertension     Plan: 1. Patient will undergo surgical management with abdominal supracervical hysterectomy, done this year,  Hopefully mid December.  1b Rectus diastasis above umbilicus, pt seeking repair. 2. Appointment with Dr. Arnoldo Morale or Dr. Constance Haw to see if they consider it advisable to do both surgeries, or to do as separate operations. 3. Weight loss encouraged preop  Pt motivation undetermined.  .mec  12/29/2016 4:18 PM  By signing my name below, I, Margit Banda, attest that this documentation has been prepared under the direction and in the presence of Jonnie Kind, MD. Electronically Signed: Margit Banda, Medical Scribe. 12/29/16. 4:18 PM.  I personally performed the services described in this documentation, which was SCRIBED in my presence. The recorded information has been reviewed and considered accurate. It has been edited as necessary during review. Jonnie Kind, MD

## 2016-12-29 NOTE — Patient Instructions (Signed)

## 2016-12-30 DIAGNOSIS — Z01818 Encounter for other preprocedural examination: Secondary | ICD-10-CM | POA: Diagnosis not present

## 2017-01-01 LAB — GC/CHLAMYDIA PROBE AMP
CHLAMYDIA, DNA PROBE: NEGATIVE
NEISSERIA GONORRHOEAE BY PCR: NEGATIVE

## 2017-01-09 ENCOUNTER — Ambulatory Visit (HOSPITAL_COMMUNITY)
Admission: RE | Admit: 2017-01-09 | Discharge: 2017-01-09 | Disposition: A | Discharge status: Home / Self Care | Payer: BLUE CROSS/BLUE SHIELD | Source: Ambulatory Visit | Attending: Cardiovascular Disease | Admitting: Cardiovascular Disease

## 2017-01-09 ENCOUNTER — Encounter (HOSPITAL_COMMUNITY)
Admission: RE | Admit: 2017-01-09 | Discharge: 2017-01-09 | Disposition: A | Discharge status: Home / Self Care | Payer: BLUE CROSS/BLUE SHIELD | Source: Ambulatory Visit | Attending: Cardiovascular Disease | Admitting: Cardiovascular Disease

## 2017-01-09 DIAGNOSIS — I25708 Atherosclerosis of coronary artery bypass graft(s), unspecified, with other forms of angina pectoris: Secondary | ICD-10-CM | POA: Diagnosis not present

## 2017-01-09 DIAGNOSIS — Z01818 Encounter for other preprocedural examination: Secondary | ICD-10-CM | POA: Insufficient documentation

## 2017-01-09 LAB — NM MYOCAR MULTI W/SPECT W/WALL MOTION / EF
CHL CUP MPHR: 170 {beats}/min
CHL CUP NUCLEAR SDS: 2
CHL CUP NUCLEAR SRS: 1
CHL CUP RESTING HR STRESS: 65 {beats}/min
CHL RATE OF PERCEIVED EXERTION: 14
CSEPEW: 9.5 METS
CSEPHR: 87 %
Exercise duration (min): 8 min
Exercise duration (sec): 0 s
LV sys vol: 24 mL
LVDIAVOL: 75 mL (ref 46–106)
Peak HR: 148 {beats}/min
RATE: 0.45
SSS: 3
TID: 1.26

## 2017-01-09 MED ORDER — SODIUM CHLORIDE 0.9% FLUSH
INTRAVENOUS | Status: AC
  Administered 2017-01-09: 10 mL via INTRAVENOUS
  Filled 2017-01-09: qty 10

## 2017-01-09 MED ORDER — REGADENOSON 0.4 MG/5ML IV SOLN
INTRAVENOUS | Status: AC
  Filled 2017-01-09: qty 5

## 2017-01-09 MED ORDER — TECHNETIUM TC 99M TETROFOSMIN IV KIT
10.0000 | PACK | Freq: Once | INTRAVENOUS | Status: AC | PRN
  Administered 2017-01-09: 9.7 via INTRAVENOUS

## 2017-01-09 MED ORDER — TECHNETIUM TC 99M TETROFOSMIN IV KIT
30.0000 | PACK | Freq: Once | INTRAVENOUS | Status: AC | PRN
  Administered 2017-01-09: 30 via INTRAVENOUS

## 2017-02-06 ENCOUNTER — Telehealth: Payer: Self-pay | Admitting: Obstetrics and Gynecology

## 2017-02-13 DIAGNOSIS — K429 Umbilical hernia without obstruction or gangrene: Secondary | ICD-10-CM | POA: Diagnosis not present

## 2017-02-13 DIAGNOSIS — M6208 Separation of muscle (nontraumatic), other site: Secondary | ICD-10-CM | POA: Diagnosis not present

## 2017-02-18 NOTE — Telephone Encounter (Signed)
Call returned to Ms Sindt regarding her upper abdomen rectus diastasis and her desire to consider one surgical date for both her Abdominal hysterectomy and her desire to correct the rectus diastasis.  Pt informs me that she is to see Dr Constance Haw in [place] in January for her opinions on surgical planning. I suspect that the general surgeon will advise that she have the surgeries as 2 separate surgeries, due to the fact that 2 separate areas are being considered, and to do both might reduce anesthetics, but would require a slower recovery.  Pt will contact our office after seeing Dr Constance Haw.

## 2017-02-20 ENCOUNTER — Other Ambulatory Visit: Payer: Self-pay | Admitting: General Surgery

## 2017-02-20 DIAGNOSIS — K429 Umbilical hernia without obstruction or gangrene: Secondary | ICD-10-CM

## 2017-02-24 ENCOUNTER — Telehealth: Payer: Self-pay | Admitting: Obstetrics and Gynecology

## 2017-03-12 ENCOUNTER — Encounter: Payer: Self-pay | Admitting: General Surgery

## 2017-03-12 ENCOUNTER — Ambulatory Visit (INDEPENDENT_AMBULATORY_CARE_PROVIDER_SITE_OTHER): Payer: BLUE CROSS/BLUE SHIELD | Admitting: General Surgery

## 2017-03-12 VITALS — BP 165/93 | HR 71 | Temp 97.8°F | Resp 18 | Ht 67.0 in | Wt 278.0 lb

## 2017-03-12 DIAGNOSIS — M6208 Separation of muscle (nontraumatic), other site: Secondary | ICD-10-CM | POA: Diagnosis not present

## 2017-03-12 DIAGNOSIS — K439 Ventral hernia without obstruction or gangrene: Secondary | ICD-10-CM

## 2017-03-12 NOTE — Patient Instructions (Signed)
Diastasis Recti Diastasis recti is when the muscles of the abdomen (rectus abdominis muscles) become thin and separate. The result is a wider space between the right and left abdomen (abdominal) muscles. This wider space between the muscles may cause a bulge in the middle of your abdomen. You may notice this bulge when you are straining or when you sit up from a lying down position. Diastasis recti can affect men and women. It is most common among pregnant women, infants, people who are obese, and people who have had abdominal surgery. Exercise or surgical treatment may help correct it. What are the causes? Common causes of this condition include:  Pregnancy. The growing uterus puts pressure on the abdominal muscles, which causes the muscles to separate.  Obesity. Excess fat puts pressure on abdominal muscles.  Weightlifting.  Some abdomen exercises.  Advanced age.  Genetics.  Prior abdominal surgery.  What increases the risk? This condition is more likely to develop in:  Women.  Newborns, especially newborns who are born early (prematurely).  What are the signs or symptoms? Common symptoms of this condition include:  A bulge in the middle of the abdomen. You will notice it most when you sit up or strain.  Pain in the low back, pelvis, or hips.  Constipation.  Inability to control when you urinate (urinary incontinence).  Bloating.  Poor posture.  How is this diagnosed? This condition is diagnosed with a physical exam. Your health care provider will ask you to lie flat on your back and do a crunch or half sit-up. If you have diastasis recti, a vertical bulge will appear between your abdominal muscles in the center of your abdomen. Your health care provider will measure the gap between your muscles with one of the following:  A medical device used to measure the space between two objects (caliper).  A tape measure.  CT scan.  Ultrasound.  Finger spaces. Your health  care provider will measure the space using their fingers.  How is this treated? If your muscle separation is not too large, you may not need treatment. However, if you are a woman who plans to become pregnant again, you should treat this condition before your next pregnancy. Treatment may include:  Physical therapy to strengthen and tighten your abdominal muscles.  Lifestyle changes such as weight loss and exercise.  Over-the-counter pain medicines as needed.  Surgery to correct the separation.  Follow these instructions at home: Activity  Return to your normal activities as told by your health care provider. Ask your health care provider what activities are safe for you.  When lifting weights or doing exercises using your abdominal muscles or the muscles in the center of your body that give stability (core muscles), make sure you are doing your exercises and movements correctly. Proper form can help to prevent the condition from happening again. General instructions  If you are overweight, ask your health care provider for help with weight loss. Losing even a small amount of weight can help to improve your diastasis recti.  Take over-the-counter or prescription medicines only as told by your health care provider.  Do not strain. Straining can make the separation worse. Examples of straining include: ? Pushing hard to have a bowel movement, such as due to constipation. ? Lifting heavy objects, including children. ? Standing up and sitting down.  Take steps to prevent constipation: ? Drink enough fluid to keep your urine clear or pale yellow. ? Take over-the-counter or prescription medicines only as  directed. ? Eat foods that are high in fiber, such as fresh fruits and vegetables, whole grains, and beans. ? Limit foods that are high in fat and processed sugars, such as fried and sweet foods. Contact a health care provider if:  You notice a new bulge in your abdomen. Get help  right away if:  You experience severe discomfort in your abdomen.  You develop severe abdominal pain along with nausea, vomiting, or fever. Summary  Diastasis recti is when the abdomen (abdominal) muscles become thin and separate. Your abdomen will stick out because the space between your right and left abdomen muscles has widened.  The most common symptom is a bulge in your abdomen. You will notice it most when you sit up or are straining.  This condition is diagnosed during a physical exam.  If the abdomen separation is not too big, you may choose not to have treatment. Otherwise, you may need to undergo physical therapy or surgery. This information is not intended to replace advice given to you by your health care provider. Make sure you discuss any questions you have with your health care provider. Document Released: 04/07/2016 Document Revised: 04/07/2016 Document Reviewed: 04/07/2016 Elsevier Interactive Patient Education  2018 [employer].   Ventral Hernia A ventral hernia is a bulge of tissue from inside the abdomen that pushes through a weak area of the muscles that form the front wall of the abdomen. The tissues inside the abdomen are inside a sac (peritoneum). These tissues include the small intestine, large intestine, and the fatty tissue that covers the intestines (omentum). Sometimes, the bulge that forms a hernia contains intestines. Other hernias contain only fat. Ventral hernias do not go away without surgical treatment. There are several types of ventral hernias. You may have:  A hernia at an incision site from previous abdominal surgery (incisional hernia).  A hernia just above the belly button (epigastric hernia), or at the belly button (umbilical hernia). These types of hernias can develop from heavy lifting or straining.  A hernia that comes and goes (reducible hernia). It may be visible only when you lift or strain. This type of hernia can be pushed back into the  abdomen (reduced).  A hernia that traps abdominal tissue inside the hernia (incarcerated hernia). This type of hernia does not reduce.  A hernia that cuts off blood flow to the tissues inside the hernia (strangulated hernia). The tissues can start to die if this happens. This is a very painful bulge that cannot be reduced. A strangulated hernia is a medical emergency.  What are the causes? This condition is caused by abdominal tissue putting pressure on an area of weakness in the abdominal muscles. What increases the risk? The following factors may make you more likely to develop this condition:  Being female.  Being 86 or older.  Being overweight or obese.  Having had previous abdominal surgery, especially if there was an infection after surgery.  Having had an injury to the abdominal wall.  Having had several pregnancies.  Having a buildup of fluid inside the abdomen (ascites).  What are the signs or symptoms? The only symptom of a ventral hernia may be a painless bulge in the abdomen. A reducible hernia may be visible only when you strain, cough, or lift. Other symptoms may include:  Dull pain.  A feeling of pressure.  Signs and symptoms of a strangulated hernia may include:  Increasing pain.  Nausea and vomiting.  Pain when pressing on the hernia.  The skin over the hernia turning red or purple.  Constipation.  Blood in the stool (feces).  How is this diagnosed? This condition may be diagnosed based on:  Your symptoms.  Your medical history.  A physical exam. You may be asked to cough or strain while standing. These actions increase the pressure inside your abdomen and force the hernia through the opening in your muscles. Your health care provider may try to reduce the hernia by pressing on it.  Imaging studies, such as an ultrasound or CT scan.  How is this treated? This condition is treated with surgery. If you have a strangulated hernia, surgery is done  as soon as possible. If your hernia is small and not incarcerated, you may be asked to lose some weight before surgery. Follow these instructions at home:  Follow instructions from your health care provider about eating or drinking restrictions.  If you are overweight, your health care provider may recommend that you increase your activity level and eat a healthier diet.  Do not lift anything that is heavier than 10 lb (4.5 kg).  Return to your normal activities as told by your health care provider. Ask your health care provider what activities are safe for you. You may need to avoid activities that increase pressure on your hernia.  Take over-the-counter and prescription medicines only as told by your health care provider.  Keep all follow-up visits as told by your health care provider. This is important. Contact a health care provider if:  Your hernia gets larger.  Your hernia becomes painful. Get help right away if:  Your hernia becomes increasingly painful.  You have pain along with any of the following: ? Changes in skin color in the area of the hernia. ? Nausea. ? Vomiting. ? Fever. Summary  A ventral hernia is a bulge of tissue from inside the abdomen that pushes through a weak area of the muscles that form the front wall of the abdomen.  This condition is treated with surgery, which may be urgent depending on your hernia.  Do not lift anything that is heavier than 10 lb (4.5 kg), and follow activity instructions from your health care provider. This information is not intended to replace advice given to you by your health care provider. Make sure you discuss any questions you have with your health care provider. Document Released: 01/28/2012 Document Revised: 09/28/2015 Document Reviewed: 09/28/2015 Elsevier Interactive Patient Education  2018 [employer].   Laparoscopic Ventral Hernia Repair Laparoscopic ventral hernia repairis a procedure to fix a bulge of tissue  that pushes through a weak area of muscle in the abdomen (ventral hernia). A ventral hernia may be at the belly button (umbilical), above the belly button (epigastric), or at the incision site from previous abdominal surgery (incisional hernia). You may have this procedure as emergency surgery if part of your intestine gets trapped inside the hernia and starts to lose its blood supply (strangulation). Laparoscopic surgery is done through small incisions using a thin surgical telescope with a light and camera on the end (laparoscope). During surgery, your surgeon will use images from the laparoscope to guide the procedure. A mesh screen will be placed in the hernia to close the opening and strengthen the abdominal wall. Tell a health care provider about:  Any allergies you have.  All medicines you are taking, including vitamins, herbs, eye drops, creams, and over-the-counter medicines.  Any problems you or family members have had with anesthetic medicines.  Any blood disorders  you have.  Any surgeries you have had.  Any medical conditions you have.  Whether you are pregnant or may be pregnant. What are the risks? Generally, this is a safe procedure. However, problems may occur, including:  Infection.  Bleeding.  Allergic reactions to medicines.  Damage to other structures or organs in the abdomen.  Trouble urinating or having a bowel movement after surgery.  Pneumonia.  Blood clots.  The hernia coming back after surgery.  Fluid buildup in the area of the hernia.  In some cases, your health care provider may need to switch from a laparoscopic procedure to a procedure that is done through a single, larger incision in the abdomen (open procedure). You may need an open procedure if:  You have a hernia that is difficult to repair.  Your organs are hard to see.  You have bleeding problems during the laparoscopic procedure.  What happens before the procedure? Staying  hydrated Follow instructions from your health care provider about hydration, which may include:  Up to 2 hours before the procedure - you may continue to drink clear liquids, such as water, clear fruit juice, black coffee, and plain tea.  Eating and drinking restrictions Follow instructions from your health care provider about eating and drinking, which may include:  8 hours before the procedure - stop eating heavy meals or foods such as meat, fried foods, or fatty foods.  6 hours before the procedure - stop eating light meals or foods, such as toast or cereal.  6 hours before the procedure - stop drinking milk or drinks that contain milk.  2 hours before the procedure - stop drinking clear liquids.  Medicines  Ask your health care provider about: ? Changing or stopping your regular medicines. This is especially important if you are taking diabetes medicines or blood thinners. ? Taking medicines such as aspirin and ibuprofen. These medicines can thin your blood. Do not take these medicines before your procedure if your health care provider instructs you not to.  You may be given antibiotic medicine to help prevent infection. General instructions  You may be asked to take a laxative or do an enema to empty your bowel before surgery (bowel prep).  Do not use any products that contain nicotine or tobacco, such as cigarettes and e-cigarettes. If you need help quitting, ask your health care provider.  You may need to have tests before the procedure, such as: ? Blood tests. ? Urine tests. ? Abdominal ultrasound. ? Chest X-ray. ? Electrocardiogram (ECG).  Plan to have someone take you home from the hospital or clinic.  If you will be going home right after the procedure, plan to have someone with you for 24 hours. What happens during the procedure?  To reduce your risk of infection: ? Your health care team will wash or sanitize their hands. ? Your skin will be washed with  soap.  An IV tube will be inserted into one of your veins.  You will be given one or more of the following: ? A medicine to help you relax (sedative). ? A medicine to make you fall asleep (general anesthetic).  A small incision will be made in your abdomen. A hollow metal tube (trocar) will be placed through the incision.  A tube will be placed through the trocar to inflate your abdomen with air-like gas. This makes it easier for your surgeon to see inside your abdomen and do the repair.  The laparoscope will be inserted into your abdomen  through the trocar. The laparoscope will send images to a monitor in the operating room.  Other trocars will be put through other small incisions in your abdomen. The surgical instruments needed for the procedure will be placed through these trocars.  The tissue or intestines that make up the hernia will be moved back into place.  The edges of the hernia may be stitched together.  A piece of mesh will be used to close the hernia. Stitches (sutures), clips, or staples will be used to keep the mesh in place.  A bandage (dressing) or skin glue will be put over the incisions. The procedure may vary among health care providers and hospitals. What happens after the procedure?  Your blood pressure, heart rate, breathing rate, and blood oxygen level will be monitored until the medicines you were given have worn off.  You will continue to receive fluids and medicines through an IV tube. Your IV tube will be removed when you can drink clear fluids.  You will be given pain medicine as needed.  You will be encouraged to get up and walk around as soon as possible.  You may have to wear compression stockings. These stockings help to prevent blood clots and reduce swelling in your legs.  You will be shown how to do deep breathing exercises to help prevent a lung infection.  Do not drive for 24 hours if you were given a sedative. This information is not  intended to replace advice given to you by your health care provider. Make sure you discuss any questions you have with your health care provider. Document Released: 01/28/2012 Document Revised: 09/28/2015 Document Reviewed: 09/28/2015 Elsevier Interactive Patient Education  [employer].

## 2017-03-13 DIAGNOSIS — K439 Ventral hernia without obstruction or gangrene: Secondary | ICD-10-CM | POA: Insufficient documentation

## 2017-03-13 DIAGNOSIS — H401131 Primary open-angle glaucoma, bilateral, mild stage: Secondary | ICD-10-CM | POA: Diagnosis not present

## 2017-03-13 DIAGNOSIS — M6208 Separation of muscle (nontraumatic), other site: Secondary | ICD-10-CM | POA: Insufficient documentation

## 2017-03-13 NOTE — Progress Notes (Signed)
[facility] History and Physical  Reason for Referral: Ventral hernia / diastasis  Referring Physician:  Dr. Glo Herring   Chief Complaint    Hernia      Ashlee Stein is a 52 y.o. female.  HPI: Ashlee Stein is a very pleasant 52 yo female with a history of fibroids and feeling a significant amount of pressure on her bladder. There are plans for her to undergo a supracervical hysterectomy and given that ths has some component of diastasis and abdominal wall hernia, she was sent for evaluation in order to determine if a dual procedure would be possible.  She reports that she had a prior umbilical hernia repair with Dr. Glo Herring during a laparoscopic tubal ligation, and that this was repaired but has ultimately failed overtime. In addition, she reports that she notices that the bulge gets larger at times, but denies any symptoms of nausea/vomiting or changes in her bowel habits. Her biggest complaint is regarding the pressure her uterus places on her bladder from the fibroids.   She has a history of a CABG in 2012, and reports no chest pain or shortness or breath.     Past Medical History:  Diagnosis Date  . Angina   . Asthma    "as a child"  . Coronary artery dissection    NSTEMI 8/12: cath with mid to dist LAD spont. dissection, unable to do PCI and referred for CABG:  SVG to LAD (Dr. PVT);  Carotid U/S 6/14:  Normal carotid arteries; FMD could not be ruled out  . Coronary artery dissection 10/24/2010  . Dyslipidemia   . Heart disease   . Hypertension   . Iron deficiency anemia   . Obesity     Past Surgical History:  Procedure Laterality Date  . CARDIAC SURGERY     open heart surgery  . CERVICAL POLYPECTOMY  07/01/2011   Procedure: CERVICAL POLYPECTOMY;  Surgeon: Jonnie Kind, MD;  Location: [facility];  Service: Gynecology;  Laterality: N/A;  Endometrial Polypectomy  . CORONARY ARTERY BYPASS GRAFT  10/24/2010   CABG X1:  coronary artery dissection -->CABG:  SVG to LAD  (Dr. PVT)  . DILATION AND CURETTAGE OF UTERUS    . HYSTEROSCOPY W/ ENDOMETRIAL ABLATION    . LAPAROSCOPIC TUBAL LIGATION  07/01/2011   Procedure: LAPAROSCOPIC TUBAL LIGATION;  Surgeon: Jonnie Kind, MD;  Location: [facility];  Service: Gynecology;  Laterality: Bilateral;  Laparoscopic Bilateral Tubal Sterilization with Fallope Rings; ended at 1143  . UMBILICAL HERNIA REPAIR  07/01/2011   Procedure: HERNIA REPAIR UMBILICAL ADULT;  Surgeon: Jonnie Kind, MD;  Location: [facility];  Service: Gynecology;  Laterality: N/A;  during Laparoscopic Bilateral Tubal Sterilization    Family History  Problem Relation Age of Onset  . Heart attack Sister   . Diabetes Mother   . Hypertension Mother   . Heart disease Mother   . Cancer Brother 50       prostate  . Diabetes Sister   . Hypertension Sister   . Hypertension Sister     Social History   Tobacco Use  . Smoking status: Never Smoker  . Smokeless tobacco: Never Used  Substance Use Topics  . Alcohol use: No    Alcohol/week: 0.0 oz  . Drug use: No    Medications: I have reviewed the patient's current medications. Allergies as of 03/12/2017      Reactions   Morphine And Related Rash   Penicillins Rash   Has patient had a  PCN reaction causing immediate rash, facial/tongue/throat swelling, SOB or lightheadedness with hypotension: yes Has patient had a PCN reaction causing severe rash involving mucus membranes or skin necrosis: no Has patient had a PCN reaction that required hospitalization: no Has patient had a PCN reaction occurring within the last 10 years: no If all of the above answers are "NO", then may proceed with Cephalosporin use.      Medication List        Accurate as of 03/12/17 11:59 PM. Always use your most recent med list.          aspirin EC 81 MG tablet Take 1 tablet (81 mg total) by mouth daily.   atorvastatin 20 MG tablet Commonly known as:  LIPITOR Take 1 tablet (20 mg total) by mouth daily.     lisinopril-hydrochlorothiazide 20-12.5 MG tablet Commonly known as:  PRINZIDE,ZESTORETIC Take 1 tablet by mouth 2 (two) times daily.   metoprolol tartrate 25 MG tablet Commonly known as:  LOPRESSOR TAKE 1 & 1/2 TABLETS BY MOUTH TWICE A DAY   multivitamin with minerals Tabs tablet Take 1 tablet daily by mouth. Every other day   Na Sulfate-K Sulfate-Mg Sulf 17.5-3.13-1.6 GM/177ML Soln Commonly known as:  SUPREP BOWEL PREP KIT Take 1 kit by mouth as directed.   omeprazole 40 MG capsule Commonly known as:  PRILOSEC Take 40 mg as needed by mouth.        ROS:  A comprehensive review of systems was negative except for: Gastrointestinal: positive for lower abdominal pain, indigestion  Blood pressure (!) 165/93, pulse 71, temperature 97.8 F (36.6 C), resp. rate 18, height 5' 7"  (1.702 m), weight 278 lb (126.1 kg). Physical Exam  Constitutional: She is oriented to person, place, and time and well-developed, well-nourished, and in no distress.  HENT:  Head: Normocephalic.  Eyes: Pupils are equal, round, and reactive to light.  Neck: Normal range of motion.  Cardiovascular: Normal rate and regular rhythm.  Pulmonary/Chest: Effort normal and breath sounds normal.  Abdominal: Soft. She exhibits no distension. There is no tenderness.  Supraumbilical bulge noted, can feel the rolled edge of the rectus but unsure if true fascial defect  Musculoskeletal: Normal range of motion.  Neurological: She is alert and oriented to person, place, and time.  Skin: Skin is warm and dry.  Psychiatric: Mood, memory, affect and judgment normal.  Vitals reviewed.   Results: CTA from 2016- reviewed and reviewed with Radiologist, Dr. Zigmund Daniel; impression from 2016 reports wide base 7cm periumbilical hernia with small bowel; but on my interpretation and Dr. Kirkland Hun this is diastasis with a small 1cm defect containing fat at the superior portion, best seen on the sagittal imagine   Assessment & Plan:   Ashlee Stein is a 52 y.o. female with a large degree of diastasis and a small ventral hernia at the time of the prior CT 2016 and physical exam findings with possible hernia on exam given the edge of the rectus that can be felt and the significant bulge.  Yet in 2016 the majority of this was diastasis.  I do think she has a component of both, and that the hernia defect is likely larger at this time compared to 2016.  We have discussed that a laparoscopic hernia repair with mesh will likely be the best repair given that the true size of the hernia defect is undetermined at this time, and given that the remaining tissue surrounding is thinned (diastasis).  I also do not think it would be  feasible to perform an open repair due to the high likelihood that you would have to make a large incision to truly verify that you were not missing the hernia that is within the diastasis.  We have discussed that normally diastasis is not repaired because there is no risk of obstruction or strangulation, but she does have a hernia defect that can be identified on the prior CT from 2016.    We have also discussed that the pain related to this procedure is fairly extensive, and that the only time we use mesh is when the operative field has no contamination.  The plan for a dual hysterectomy and hernia repair is probably not the best given pain control issues and also given the risk of any contamination (although less likely with supracervical hysterectomy (vaginal bacteria should be contained).    -Proceed with hysterectomy separate from the hernia repair  -Would get her colonoscopy prior to any mesh repair of the hernia pending that the would need additional abdominal procedures that would negate the repair   -Follow up after colonoscopy/ hysterectomy to discuss laparoscopic hernia repair with mesh   All questions were answered to the satisfaction of the patient.    Virl Cagey 03/13/2017, 2:16 PM

## 2017-03-24 NOTE — Telephone Encounter (Signed)
Left message for pt to decide whether to pursue hysterectomy or ventral hernia first. Pt to contact our office prn.

## 2017-04-13 DIAGNOSIS — I2510 Atherosclerotic heart disease of native coronary artery without angina pectoris: Secondary | ICD-10-CM | POA: Diagnosis not present

## 2017-04-13 DIAGNOSIS — Z0001 Encounter for general adult medical examination with abnormal findings: Secondary | ICD-10-CM | POA: Diagnosis not present

## 2017-04-13 DIAGNOSIS — E669 Obesity, unspecified: Secondary | ICD-10-CM | POA: Diagnosis not present

## 2017-04-13 DIAGNOSIS — I10 Essential (primary) hypertension: Secondary | ICD-10-CM | POA: Diagnosis not present

## 2017-05-14 DIAGNOSIS — H3589 Other specified retinal disorders: Secondary | ICD-10-CM | POA: Diagnosis not present

## 2017-05-14 DIAGNOSIS — H401131 Primary open-angle glaucoma, bilateral, mild stage: Secondary | ICD-10-CM | POA: Diagnosis not present

## 2017-05-16 ENCOUNTER — Ambulatory Visit
Admission: RE | Admit: 2017-05-16 | Discharge: 2017-05-16 | Disposition: A | Discharge status: Home / Self Care | Payer: BLUE CROSS/BLUE SHIELD | Source: Ambulatory Visit | Attending: General Surgery | Admitting: General Surgery

## 2017-05-16 DIAGNOSIS — K429 Umbilical hernia without obstruction or gangrene: Secondary | ICD-10-CM | POA: Diagnosis not present

## 2017-05-16 MED ORDER — IOPAMIDOL (ISOVUE-300) INJECTION 61%
125.0000 mL | Freq: Once | INTRAVENOUS | Status: AC | PRN
  Administered 2017-05-16: 125 mL via INTRAVENOUS

## 2017-05-20 ENCOUNTER — Telehealth: Payer: Self-pay | Admitting: Obstetrics and Gynecology

## 2017-05-20 NOTE — Telephone Encounter (Signed)
Informed patient that I received message from Dr Glo Herring that patient has huge fibroids and needs to be seen. Pt verbalized understanding and would like to make an appointment. Appt made for 4/10

## 2017-05-20 NOTE — Telephone Encounter (Signed)
Patient called stating that she would like a call from Dr. Glo Herring regarding a CT scan that she had done on 05/16/2017. Pt states that he should have already gotten the results from the facility. Please contact pt

## 2017-06-03 ENCOUNTER — Ambulatory Visit (INDEPENDENT_AMBULATORY_CARE_PROVIDER_SITE_OTHER): Payer: BLUE CROSS/BLUE SHIELD | Admitting: Obstetrics and Gynecology

## 2017-06-03 ENCOUNTER — Encounter: Payer: Self-pay | Admitting: Obstetrics and Gynecology

## 2017-06-03 VITALS — BP 118/80 | HR 97 | Ht 67.2 in | Wt 276.2 lb

## 2017-06-03 DIAGNOSIS — K429 Umbilical hernia without obstruction or gangrene: Secondary | ICD-10-CM | POA: Diagnosis not present

## 2017-06-03 DIAGNOSIS — D251 Intramural leiomyoma of uterus: Secondary | ICD-10-CM

## 2017-06-03 DIAGNOSIS — D250 Submucous leiomyoma of uterus: Secondary | ICD-10-CM

## 2017-06-03 NOTE — Progress Notes (Signed)
[facility] Visit  06/03/2017           Patient name: Ashlee Stein MRN [id number]  Date of birth: 08/13/1965  CC & HPI:  Ashlee Stein is a 52 y.o. female presenting today for f/u appointment to discuss 'huge' fibroids that were found by CT scan on 05/16/17. She has associated symptoms of pressure on her bladder. She has been planning to undergo a supracervical hysterectomy, and a procedure to fix her large degree rectus diastasis and abdominal umbilical hernia. Please review the note by Dr Constance Haw Last pap was 11/20/16 and normal.  ROS:  ROS (+) uterine fibroids (+) pressure on bladder  Pertinent History Reviewed:   Reviewed: Significant for uterine fibroids, cervical polypectomy, tubal ligation Medical         Past Medical History:  Diagnosis Date  . Angina   . Asthma    "as a child"  . Coronary artery dissection    NSTEMI 8/12: cath with mid to dist LAD spont. dissection, unable to do PCI and referred for CABG:  SVG to LAD (Dr. PVT);  Carotid U/S 6/14:  Normal carotid arteries; FMD could not be ruled out  . Coronary artery dissection 10/24/2010  . Dyslipidemia   . Heart disease   . Hypertension   . Iron deficiency anemia   . Obesity                               Surgical Hx:    Past Surgical History:  Procedure Laterality Date  . CARDIAC SURGERY     open heart surgery  . CERVICAL POLYPECTOMY  07/01/2011   Procedure: CERVICAL POLYPECTOMY;  Surgeon: Jonnie Kind, MD;  Location: [facility];  Service: Gynecology;  Laterality: N/A;  Endometrial Polypectomy  . CORONARY ARTERY BYPASS GRAFT  10/24/2010   CABG X1:  coronary artery dissection -->CABG:  SVG to LAD (Dr. PVT)  . DILATION AND CURETTAGE OF UTERUS    . HYSTEROSCOPY W/ ENDOMETRIAL ABLATION    . LAPAROSCOPIC TUBAL LIGATION  07/01/2011   Procedure: LAPAROSCOPIC TUBAL LIGATION;  Surgeon: Jonnie Kind, MD;  Location: [facility];  Service: Gynecology;  Laterality: Bilateral;  Laparoscopic Bilateral Tubal Sterilization with  Fallope Rings; ended at 1143  . UMBILICAL HERNIA REPAIR  07/01/2011   Procedure: HERNIA REPAIR UMBILICAL ADULT;  Surgeon: Jonnie Kind, MD;  Location: [facility];  Service: Gynecology;  Laterality: N/A;  during Laparoscopic Bilateral Tubal Sterilization   Medications: Reviewed & Updated - see associated section                       Current Outpatient Medications:  .  aspirin EC 81 MG tablet, Take 1 tablet (81 mg total) by mouth daily., Disp: , Rfl:  .  atorvastatin (LIPITOR) 20 MG tablet, Take 1 tablet (20 mg total) by mouth daily., Disp: 90 tablet, Rfl: 3 .  lisinopril-hydrochlorothiazide (PRINZIDE,ZESTORETIC) 20-12.5 MG tablet, Take 1 tablet by mouth 2 (two) times daily. , Disp: , Rfl: 3 .  metoprolol tartrate (LOPRESSOR) 25 MG tablet, TAKE 1 & 1/2 TABLETS BY MOUTH TWICE A DAY (Patient taking differently: Take 37.5 mg by mouth 2 (two) times daily. TAKE 1 & 1/2 TABLETS BY MOUTH TWICE A DAY), Disp: 270 tablet, Rfl: 3 .  Multiple Vitamin (MULITIVITAMIN WITH MINERALS) TABS, Take 1 tablet daily by mouth. Every other day, Disp: , Rfl:  .  omeprazole (PRILOSEC) 40  MG capsule, Take 40 mg as needed by mouth. , Disp: , Rfl: 3 .  Na Sulfate-K Sulfate-Mg Sulf (SUPREP BOWEL PREP KIT) 17.5-3.13-1.6 GM/180ML SOLN, Take 1 kit by mouth as directed. (Patient not taking: Reported on 06/03/2017), Disp: 1 Bottle, Rfl: 0   Social History: Reviewed -  reports that she has never smoked. She has never used smokeless tobacco.  Objective Findings:  Vitals: Pulse 97, height 5' 7.2" (1.707 m), weight 276 lb 3.2 oz (125.3 kg), last menstrual period 05/08/2017.  PHYSICAL EXAMINATION General appearance - alert, well appearing, and in no distress and oriented to person, place, and time Mental status - alert, oriented to person, place, and time, normal mood, behavior, speech, dress, motor activity, and thought processes  PELVIC CT Scan Uterus - markedly enlarged uterus  20x12x15 There is a question of an ovarian cyst on one  side it appears simple in character Discussion: 1. Discussed with pt the results of her CT scan. Option of hysterectomy and what will be removed and kept also  Discussed. Pt wants to keep her cervix, and is ok with removing the uterus, tubes, and wants to keep ovaries if possible. She wants her surgery done in late May.  At end of discussion, pt had opportunity to ask questions and has no further questions at this time.   Specific discussion as noted above. Greater than 50% was spent in counseling and coordination of care with the patient.   Total time greater than: 25 minutes.     Assessment & Plan:   A:  1.  Uterine fibroids 16-18 week size  P:  1.  Schedule pre-op visit for early May 2. Plan for abdominal supracervical hysterectomy,  bilateral salpingectomy with umbilical herniorrhaphy in late may.  May require midline vertical incision.  To be decided at  a preop visit May 13 or 15    By signing my name below, I, [name], attest that this documentation has been prepared under the direction and in the presence of Jonnie Kind, MD. Electronically Signed: Jabier Gauss, Medical Scribe. 06/03/17. 4:12 PM.  I personally performed the services described in this documentation, which was SCRIBED in my presence. The recorded information has been reviewed and considered accurate. It has been edited as necessary during review. Jonnie Kind, MD

## 2017-07-08 ENCOUNTER — Ambulatory Visit (INDEPENDENT_AMBULATORY_CARE_PROVIDER_SITE_OTHER): Payer: BLUE CROSS/BLUE SHIELD | Admitting: Obstetrics and Gynecology

## 2017-07-08 VITALS — BP 128/74 | Ht 67.5 in | Wt 275.8 lb

## 2017-07-08 DIAGNOSIS — D259 Leiomyoma of uterus, unspecified: Secondary | ICD-10-CM | POA: Diagnosis not present

## 2017-07-08 DIAGNOSIS — E669 Obesity, unspecified: Secondary | ICD-10-CM

## 2017-07-08 DIAGNOSIS — Z01818 Encounter for other preprocedural examination: Secondary | ICD-10-CM

## 2017-07-08 NOTE — Progress Notes (Signed)
Patient ID: Ashlee Stein, female   DOB: October 26, 1965, 52 y.o.   MRN: [id number] Preoperative History and Physical  Ashlee Stein is a 52 y.o. [id number] here for surgical management of uterine fibroids. She has been trying to lose weight, but has not lost much  No significant preoperative concerns.  Proposed surgery: HYSTERECTOMY SUPRACERVICAL ABDOMINAL and BILATERAL SALPINGECTOMY  Past Medical History:  Diagnosis Date  . Angina   . Asthma    "as a child"  . Coronary artery dissection    NSTEMI 8/12: cath with mid to dist LAD spont. dissection, unable to do PCI and referred for CABG:  SVG to LAD (Dr. PVT);  Carotid U/S 6/14:  Normal carotid arteries; FMD could not be ruled out  . Coronary artery dissection 10/24/2010  . Dyslipidemia   . Heart disease   . Hypertension   . Iron deficiency anemia   . Obesity    Past Surgical History:  Procedure Laterality Date  . CARDIAC SURGERY     open heart surgery  . CERVICAL POLYPECTOMY  07/01/2011   Procedure: CERVICAL POLYPECTOMY;  Surgeon: Jonnie Kind, MD;  Location: [facility];  Service: Gynecology;  Laterality: N/A;  Endometrial Polypectomy  . CORONARY ARTERY BYPASS GRAFT  10/24/2010   CABG X1:  coronary artery dissection -->CABG:  SVG to LAD (Dr. PVT)  . DILATION AND CURETTAGE OF UTERUS    . HYSTEROSCOPY W/ ENDOMETRIAL ABLATION    . LAPAROSCOPIC TUBAL LIGATION  07/01/2011   Procedure: LAPAROSCOPIC TUBAL LIGATION;  Surgeon: Jonnie Kind, MD;  Location: [facility];  Service: Gynecology;  Laterality: Bilateral;  Laparoscopic Bilateral Tubal Sterilization with Fallope Rings; ended at 1143  . UMBILICAL HERNIA REPAIR  07/01/2011   Procedure: HERNIA REPAIR UMBILICAL ADULT;  Surgeon: Jonnie Kind, MD;  Location: [facility];  Service: Gynecology;  Laterality: N/A;  during Laparoscopic Bilateral Tubal Sterilization   OB History  Gravida Para Term Preterm AB Living  _0 SAB TAB Ectopic Multiple Live Births          2    # Outcome Date GA Lbr Len/2nd  Weight Sex Delivery Anes PTL Lv  2 Term     M Vag-Spont   LIV  1 Term     F Vag-Spont   LIV  Patient denies any other pertinent gynecologic issues.   Current Outpatient Medications on File Prior to Visit  Medication Sig Dispense Refill  . aspirin EC 81 MG tablet Take 1 tablet (81 mg total) by mouth daily.    Marland Kitchen atorvastatin (LIPITOR) 20 MG tablet Take 1 tablet (20 mg total) by mouth daily. 90 tablet 3  . lisinopril-hydrochlorothiazide (PRINZIDE,ZESTORETIC) 20-12.5 MG tablet Take 1 tablet by mouth 2 (two) times daily.   3  . metoprolol tartrate (LOPRESSOR) 25 MG tablet TAKE 1 & 1/2 TABLETS BY MOUTH TWICE A DAY (Patient taking differently: Take 37.5 mg by mouth 2 (two) times daily. TAKE 1 & 1/2 TABLETS BY MOUTH TWICE A DAY) 270 tablet 3  . Multiple Vitamin (MULITIVITAMIN WITH MINERALS) TABS Take 1 tablet daily by mouth. Every other day    . omeprazole (PRILOSEC) 40 MG capsule Take 40 mg as needed by mouth.   3  . Na Sulfate-K Sulfate-Mg Sulf (SUPREP BOWEL PREP KIT) 17.5-3.13-1.6 GM/180ML SOLN Take 1 kit by mouth as directed. (Patient not taking: Reported on 06/03/2017) 1 Bottle 0   No current facility-administered medications on file prior to visit.    Allergies  Allergen Reactions  . Morphine And Related Rash  . Penicillins Rash    Has patient had a PCN reaction causing immediate rash, facial/tongue/throat swelling, SOB or lightheadedness with hypotension: yes Has patient had a PCN reaction causing severe rash involving mucus membranes or skin necrosis: no Has patient had a PCN reaction that required hospitalization: no Has patient had a PCN reaction occurring within the last 10 years: no If all of the above answers are "NO", then may proceed with Cephalosporin use.     Social History:   reports that she has never smoked. She has never used smokeless tobacco. She reports that she does not drink alcohol or use drugs.  Family History  Problem Relation Age of Onset  . Heart attack Sister    . Diabetes Mother   . Hypertension Mother   . Heart disease Mother   . Cancer Brother 50       prostate  . Diabetes Sister   . Hypertension Sister   . Hypertension Sister     Review of Systems: Noncontributory  PHYSICAL EXAM: Blood pressure 128/74, height 5' 7.5" (1.715 m), weight 275 lb 12.8 oz (125.1 kg). General appearance - alert, well appearing, and in no distress, oriented to person, place, and time and overweight Mental status - alert, oriented to person, place, and time, normal mood, behavior, speech, dress, motor activity, and thought processes, affect appropriate to mood Chest - clear to auscultation, no wheezes, rales or rhonchi, symmetric air entry Heart - normal rate and regular rhythm Abdomen - soft, nontender, nondistended, no masses or organomegaly  PELVIC External genitalia - vitiligo and also on her thighs Vulva - normal  Vagina - normal Cervix - normal Uterus - 14-16 week size, mobile  Adnexa - negative  Labs: No results found for this or any previous visit (from the past 336 hour(s)).  Imaging Studies: No results found.  Assessment: 1. Obese Patient Active Problem List   Diagnosis Date Noted  . Intramural and submucous leiomyoma of uterus 06/03/2017  . Diastasis recti 03/13/2017  . Epigastric hernia 03/13/2017  . Morbid obesity ([place]) 08/24/2015  . Dyspareunia 08/29/2013  . Umbilical hernia 24/58/0998  . Menorrhagia 06/29/2011  . Anemia 01/07/2011  . Cough 12/06/2010  . Chest pain 11/14/2010  . Coronary artery dissection   . Dyslipidemia   . Hypertension     Plan: 1. Patient will undergo surgical management with HYSTERECTOMY SUPRACERVICAL ABDOMINAL and BILATERAL SALPINGECTOMY on 08/04/2017 @ 715am 2. F/u post-op 6 weeks 3. Weight loss, Rx phentermine, advised to stop 3 days before surgery   By signing my name below, I, Margit Banda, attest that this documentation has been prepared under the direction and in the presence of Jonnie Kind, MD. Electronically Signed: Margit Banda, Medical Scribe. 07/08/17. 4:18 PM.  I personally performed the services described in this documentation, which was SCRIBED in my presence. The recorded information has been reviewed and considered accurate. It has been edited as necessary during review. Jonnie Kind, MD

## 2017-07-09 ENCOUNTER — Telehealth: Payer: Self-pay | Admitting: Obstetrics and Gynecology

## 2017-07-09 ENCOUNTER — Other Ambulatory Visit (HOSPITAL_COMMUNITY): Payer: BLUE CROSS/BLUE SHIELD

## 2017-07-09 NOTE — Telephone Encounter (Signed)
Pt was here yesterday and Ferg had told her he was going to call in some diet pills, [facility] [address], can we check on that for her.k

## 2017-07-10 ENCOUNTER — Telehealth: Payer: Self-pay | Admitting: *Deleted

## 2017-07-10 MED ORDER — PHENTERMINE HCL 37.5 MG PO CAPS: 37.5000 mg | ORAL_CAPSULE | ORAL | 2 refills | Status: DC

## 2017-07-10 NOTE — Telephone Encounter (Signed)
Informed patient Phentermine was sent to pharmacy today.

## 2017-07-10 NOTE — Telephone Encounter (Signed)
Phentermine escribed to [facility]

## 2017-07-22 ENCOUNTER — Encounter: Payer: BLUE CROSS/BLUE SHIELD | Admitting: Obstetrics and Gynecology

## 2017-07-27 NOTE — Patient Instructions (Signed)
Ashlee Stein  07/27/2017     @PREFPERIOPPHARMACY @   Your procedure is scheduled on  08/04/2017   Report to [facility] at  615  A.M.  Call this number if you have problems the morning of surgery:  [phone]   Remember:  No food after midnight.  You may drink clear liquids until  From 12 noon 08/03/2017 until 12 midnight 6/10/20190.  Clear liquids allowed are:                    Water, Juice (non-citric and without pulp), Carbonated beverages, Clear Tea, Black Coffee only, Plain Jell-O only, Gatorade and Plain Popsicles only    Take these medicines the morning of surgery with A SIP OF WATER  Lisinopril, metoprolol, prilosec.    Do not wear jewelry, make-up or nail polish.  Do not wear lotions, powders, or perfumes, or deodorant.  Do not shave 48 hours prior to surgery.  Men may shave face and neck.  Do not bring valuables to the hospital.  [facility] is not responsible for any belongings or valuables.  Contacts, dentures or bridgework may not be worn into surgery.  Leave your suitcase in the car.  After surgery it may be brought to your room.  For patients admitted to the hospital, discharge time will be determined by your treatment team.  Patients discharged the day of surgery will not be allowed to drive home.   Name and phone number of your driver:   family Special instructions:  Follow the enclosed diet and prep instructions from Dr Glo Herring.  Please read over the following fact sheets that you were given. Pain Booklet, Coughing and Deep Breathing, Blood Transfusion Information, Lab Information, Surgical Site Infection Prevention, Anesthesia Post-op Instructions and Care and Recovery After Surgery       Open Hernia Repair, Adult Open hernia repair is a surgical procedure to fix a hernia. A hernia occurs when an internal organ or tissue pushes out through a weak spot in the abdominal wall muscles. Hernias commonly occur in the groin and around  the navel. Most hernias tend to get worse over time. Often, surgery is done to prevent the hernia from becoming bigger, uncomfortable, or an emergency. Emergency surgery may be needed if abdominal contents get stuck in the opening (incarcerated hernia) or the blood supply gets cut off (strangulated hernia). In an open repair, an incision is made in the abdomen to perform the surgery. Tell a health care provider about:  Any allergies you have.  All medicines you are taking, including vitamins, herbs, eye drops, creams, and over-the-counter medicines.  Any problems you or family members have had with anesthetic medicines.  Any blood or bone disorders you have.  Any surgeries you have had.  Any medical conditions you have, including any recent cold or flu symptoms.  Whether you are pregnant or may be pregnant. What are the risks? Generally, this is a safe procedure. However, problems may occur, including:  Long-lasting (chronic) pain.  Bleeding.  Infection.  Damage to the testicle. This can cause shrinking or swelling.  Damage to the bladder, blood vessels, intestine, or nerves near the hernia.  Trouble passing urine.  Allergic reactions to medicines.  Return of the hernia.  What happens before the procedure? Staying hydrated Follow instructions from your health care provider about hydration, which may include:  Up to 2 hours before the procedure -  you may continue to drink clear liquids, such as water, clear fruit juice, black coffee, and plain tea.  Eating and drinking restrictions Follow instructions from your health care provider about eating and drinking, which may include:  8 hours before the procedure - stop eating heavy meals or foods such as meat, fried foods, or fatty foods.  6 hours before the procedure - stop eating light meals or foods, such as toast or cereal.  6 hours before the procedure - stop drinking milk or drinks that contain milk.  2 hours before  the procedure - stop drinking clear liquids.  Medicines  Ask your health care provider about: ? Changing or stopping your regular medicines. This is especially important if you are taking diabetes medicines or blood thinners. ? Taking medicines such as aspirin and ibuprofen. These medicines can thin your blood. Do not take these medicines before your procedure if your health care provider instructs you not to.  You may be given antibiotic medicine to help prevent infection. General instructions  You may have blood tests or imaging studies.  Ask your health care provider how your surgical site will be marked or identified.  If you smoke, do not smoke for at least 2 weeks before your procedure or for as long as told by your health care provider.  Let your health care provider know if you develop a cold or any infection before your surgery.  Plan to have someone take you home from the hospital or clinic.  If you will be going home right after the procedure, plan to have someone with you for 24 hours. What happens during the procedure?  To reduce your risk of infection: ? Your health care team will wash or sanitize their hands. ? Your skin will be washed with soap. ? Hair may be removed from the surgical area.  An IV tube will be inserted into one of your veins.  You will be given one or more of the following: ? A medicine to help you relax (sedative). ? A medicine to numb the area (local anesthetic). ? A medicine to make you fall asleep (general anesthetic).  Your surgeon will make an incision over the hernia.  The tissues of the hernia will be moved back into place.  The edges of the hernia may be stitched together.  The opening in the abdominal muscles will be closed with stitches (sutures). Or, your surgeon will place a mesh patch made of manmade (synthetic) material over the opening.  The incision will be closed.  A bandage (dressing) may be placed over the  incision. The procedure may vary among health care providers and hospitals. What happens after the procedure?  Your blood pressure, heart rate, breathing rate, and blood oxygen level will be monitored until the medicines you were given have worn off.  You may be given medicine for pain.  Do not drive for 24 hours if you received a sedative. This information is not intended to replace advice given to you by your health care provider. Make sure you discuss any questions you have with your health care provider. Document Released: 08/06/2000 Document Revised: 08/31/2015 Document Reviewed: 07/25/2015 Elsevier Interactive Patient Education  2018 [place], Adult, Care After These instructions give you information about caring for yourself after your procedure. Your doctor may also give you more specific instructions. If you have problems or questions, contact your doctor. Follow these instructions at home: Surgical cut (incision) care   Follow instructions  from your doctor about how to take care of your surgical cut area. Make sure you: ? Wash your hands with soap and water before you change your bandage (dressing). If you cannot use soap and water, use hand sanitizer. ? Change your bandage as told by your doctor. ? Leave stitches (sutures), skin glue, or skin tape (adhesive) strips in place. They may need to stay in place for 2 weeks or longer. If tape strips get loose and curl up, you may trim the loose edges. Do not remove tape strips completely unless your doctor says it is okay.  Check your surgical cut every day for signs of infection. Check for: ? More redness, swelling, or pain. ? More fluid or blood. ? Warmth. ? Pus or a bad smell. Activity  Do not drive or use heavy machinery while taking prescription pain medicine. Do not drive until your doctor says it is okay.  Until your doctor says it is okay: ? Do not lift anything that is heavier than 10 lb (4.5  kg). ? Do not play contact sports.  Return to your normal activities as told by your doctor. Ask your doctor what activities are safe. General instructions  To prevent or treat having a hard time pooping (constipation) while you are taking prescription pain medicine, your doctor may recommend that you: ? Drink enough fluid to keep your pee (urine) clear or pale yellow. ? Take over-the-counter or prescription medicines. ? Eat foods that are high in fiber, such as fresh fruits and vegetables, whole grains, and beans. ? Limit foods that are high in fat and processed sugars, such as fried and sweet foods.  Take over-the-counter and prescription medicines only as told by your doctor.  Do not take baths, swim, or use a hot tub until your doctor says it is okay.  Keep all follow-up visits as told by your doctor. This is important. Contact a doctor if:  You develop a rash.  You have more redness, swelling, or pain around your surgical cut.  You have more fluid or blood coming from your surgical cut.  Your surgical cut feels warm to the touch.  You have pus or a bad smell coming from your surgical cut.  You have a fever or chills.  You have blood in your poop (stool).  You have not pooped in 2-3 days.  Medicine does not help your pain. Get help right away if:  You have chest pain or you are short of breath.  You feel light-headed.  You feel weak and dizzy (feel faint).  You have very bad pain.  You throw up (vomit) and your pain is worse. This information is not intended to replace advice given to you by your health care provider. Make sure you discuss any questions you have with your health care provider. Document Released: 03/03/2014 Document Revised: 08/31/2015 Document Reviewed: 07/25/2015 Elsevier Interactive Patient Education  2018 [place], also called tubectomy, is the surgical removal of one of the fallopian tubes. The fallopian  tubes are where eggs travel from the ovaries to the uterus. Removing one fallopian tube does not prevent you from becoming pregnant. It also does not cause problems with your menstrual periods. You may need a salpingectomy if you:  Have a fertilized egg that attaches to the fallopian tube (ectopic pregnancy), especially one that causes the tube to burst or tear (rupture).  Have an infected fallopian tube.  Have cancer of the fallopian tube or nearby organs.  Have had an ovary removed due to a cyst or tumor.  Have had your uterus removed.  There are three different methods that can be used for a salpingectomy:  Open. This method involves making one large incision in your abdomen.  Laparoscopic. This method involves using a thin, lighted tube with a tiny camera on the end (laparoscope) to help perform the procedure. The laparoscope will allow your surgeon to make several small incisions in the abdomen instead of a large incision.  Robot-assisted: This method involves using a computer to control surgical instruments that are attached to robotic arms.  Tell a health care provider about:  Any allergies you have.  All medicines you are taking, including vitamins, herbs, eye drops, creams, and over-the-counter medicines.  Any problems you or family members have had with anesthetic medicines.  Any blood disorders you have.  Any surgeries you have had.  Any medical conditions you have.  Whether you are pregnant or may be pregnant. What are the risks? Generally, this is a safe procedure. However, problems may occur, including:  Infection.  Bleeding.  Allergic reactions to medicines.  Damage to other structures or organs.  Blood clots in the legs or lungs.  What happens before the procedure? Staying hydrated Follow instructions from your health care provider about hydration, which may include:  Up to 2 hours before the procedure - you may continue to drink clear liquids, such  as water, clear fruit juice, black coffee, and plain tea.  Eating and drinking restrictions Follow instructions from your health care provider about eating and drinking, which may include:  8 hours before the procedure - stop eating heavy meals or foods such as meat, fried foods, or fatty foods.  6 hours before the procedure - stop eating light meals or foods, such as toast or cereal.  6 hours before the procedure - stop drinking milk or drinks that contain milk.  2 hours before the procedure - stop drinking clear liquids.  Medicines  Ask your health care provider about: ? Changing or stopping your regular medicines. This is especially important if you are taking diabetes medicines or blood thinners. ? Taking medicines such as aspirin and ibuprofen. These medicines can thin your blood. Do not take these medicines before your procedure if your health care provider instructs you not to.  You may be given antibiotic medicine to help prevent infection. General instructions  Do not smoke for at least 2 weeks before your procedure. If you need help quitting, ask your health care provider.  You may have an exam or tests, such as an electrocardiogram (ECG).  You may have a blood or urine sample taken.  Ask your health care provider: ? Whether you should stop removing hair from your surgical area. ? How your surgical site will be marked or identified.  You may be asked to shower with a germ-killing soap.  Plan to have someone take you home from the hospital or clinic.  If you will be going home right after the procedure, plan to have someone with you for 24 hours. What happens during the procedure?  To reduce your risk of infection: ? Your health care team will wash or sanitize their hands. ? Hair may be removed from the surgical area. ? Your skin will be washed with soap.  An IV tube will be inserted into one of your veins.  You will be given a medicine to make you fall asleep  (general anesthetic). You may also be given  a medicine to help you relax (sedative).  A thin tube (catheter) may be inserted through your urethra and into your bladder to drain urine during your procedure.  Depending on the type of procedure you are having, one incision or several small incisions will be made in your abdomen.  Your fallopian tube will be cut and removed from where it attaches to your uterus.  Your blood vessels will be clamped and tied to prevent excess bleeding.  The incision(s) in your abdomen will be closed with stitches (sutures), staples, or skin glue.  A bandage (dressing) may be placed over your incision(s). The procedure may vary among health care providers and hospitals. What happens after the procedure?  Your blood pressure, heart rate, breathing rate, and blood oxygen level will be monitored until the medicines you were given have worn off.  You may continue to receive fluids and medicines through an IV tube.  You may continue to have a catheter draining your urine.  You may have to wear compression stockings. These stockings help to prevent blood clots and reduce swelling in your legs.  You will be given pain medicine as needed.  Do not drive for 24 hours if you received a sedative. Summary  Salpingectomy is a surgical procedure to remove one of the fallopian tubes.  The procedure may be done with an open incision, with a laparoscope, or with computer-controlled instruments.  Depending on the type of procedure you are having, one incision or several small incisions will be made in your abdomen.  Your blood pressure, heart rate, breathing rate, and blood oxygen level will be monitored until the medicines you were given have worn off.  Plan to have someone take you home from the hospital or clinic. This information is not intended to replace advice given to you by your health care provider. Make sure you discuss any questions you have with your health  care provider. Document Released: 06/29/2008 Document Revised: 09/28/2015 Document Reviewed: 08/04/2012 Elsevier Interactive Patient Education  2018 [place] Hysterectomy A supracervical hysterectomy is surgery to remove the top part of the uterus, but not the cervix. You will no longer have menstrual periods or be able to get pregnant after this surgery. The fallopian tubes and ovaries may also be removed (bilateral salpingo-oophorectomy) during this surgery. This surgery is usually performed using a minimally invasive technique called laparoscopy. This technique allows the surgery to be done through small incisions. The minimally invasive technique provides benefits such as less pain, less risk of infection, and shorter recovery time. Tell a health care provider about:  Any allergies you have.  All medicines you are taking, including vitamins, herbs, eye drops, creams, and over-the-counter medicines.  Any problems you or family members have had with anesthetic medicines.  Any blood disorders you have.  Any surgeries you have had.  Any medical conditions you have. What are the risks? Generally, this is a safe procedure. However, as with any procedure, complications can occur. Possible complications include:  Bleeding.  Blood clots in the legs or lung.  Infection.  Injury to surrounding organs.  Problems related to anesthesia.  Conversion to an open abdominal surgery.  Additional surgery later to remove the cervix if you have problems with the cervix.  What happens before the procedure?  Ask your health care provider about changing or stopping your regular medicines.  Do not take aspirin or blood thinners (anticoagulants) for 1 week before the surgery, or as directed by your health care provider.  Do not eat or drink anything for 8 hours before the surgery, or as directed by your health care provider.  Quit smoking if you smoke.  Arrange for a ride  home after surgery and for someone to help you at home during recovery. What happens during the procedure?  You will be given an antibiotic medicine.  An IV tube will be placed in one of your veins. You will be given medicine to make you sleep (general anesthetic).  A gas (carbon dioxide) will be used to inflate your abdomen. This will allow your surgeon to look inside your abdomen, perform your surgery, and treat any other problems found if necessary.  Three or four small incisions will be made in your abdomen. One of these incisions will be made in the area of your belly button (navel). A thin, flexible tube with a tiny camera and light on the end of it (laparoscope) will be inserted into the incision. The camera on the laparoscope sends a picture to a TV screen in the operating room. This gives your surgeon a good view inside the abdomen.  Other surgical instruments will be inserted through the other incisions.  The uterus will be cut into small pieces and removed through the small incisions.  Your incisions will be closed. What happens after the procedure?  You will be taken to a recovery area where your progress will be monitored until you are awake, stable, and taking fluids well. If there are no other problems, you will then be moved to a regular hospital room, or you will be allowed to go home.  You will likely have minimal discomfort after the surgery because the incisions are so small with the laparoscopic technique.  You will be given pain medicine while you are in the hospital and for when you go home.  If a bilateral salpingo-oophorectomy was performed before menopause, you will go through a sudden (abrupt) menopause. This can be helped with hormone medicines. This information is not intended to replace advice given to you by your health care provider. Make sure you discuss any questions you have with your health care provider. Document Released: 07/30/2007 Document Revised:  07/19/2015 Document Reviewed: 08/13/2012 Elsevier Interactive Patient Education  2018 [place] Hysterectomy, Care After Refer to this sheet in the next few weeks. These instructions provide you with information on caring for yourself after your procedure. Your health care provider may also give you more specific instructions. Your treatment has been planned according to current medical practices, but problems sometimes occur. Call your health care provider if you have any problems or questions after your procedure. What can I expect after the procedure? After your procedure, it is typical to have some discomfort, tenderness, swelling, and bruising at the surgical sites. This normally lasts for about 2 weeks. Follow these instructions at home:  Get plenty of rest and sleep.  Only take over-the-counter or prescription medicines as directed by your health care provider.  Do not take aspirin. It can cause bleeding.  Do not drive until your health care provider approves.  Follow your health care provider's advice regarding exercise, lifting, and general activities.  Resume your usual diet as directed by your health care provider.  Do not douche, use tampons, or have sexual intercourse for at least 6 weeks or until your health care provider gives you permission.  Change your bandages (dressings) only as directed by your health care provider.  Monitor your temperature.  Take showers instead of baths for  2-3 weeks or as directed by your health care provider.  Drink enough fluids to keep your urine clear or pale yellow.  Do not drink alcohol until your health care provider gives you permission.  If you are constipated, you may take a mild laxative if your health care provider approves. Bran foods may also help with constipation problems.  Try to have someone home with you for 1-2 weeks to help with activities.  Follow up with your health care provider as  directed. Contact a health care provider if:  You have swelling, redness, or increasing pain in the incision area.  You have pus coming from an incision.  You notice a bad smell coming from the incision or dressing.  You have swelling, redness, or pain in the area around the IV site.  Your incision breaks open.  You feel dizzy or lightheaded.  You have pain or bleeding when you urinate.  You have persistent diarrhea.  You have persistent nausea and vomiting.  You have abnormal vaginal discharge.  You have a rash.  Your pain is not controlled with your prescribed medicine. Get help right away if:  You have a fever.  You have severe abdominal pain.  You have chest pain.  You have shortness of breath.  You faint.  You have pain, swelling, or redness in your leg.  You have heavy vaginal bleeding with blood clots. This information is not intended to replace advice given to you by your health care provider. Make sure you discuss any questions you have with your health care provider. Document Released: 12/01/2012 Document Revised: 07/19/2015 Document Reviewed: 08/13/2012 Elsevier Interactive Patient Education  2018 [place] Anesthesia, Adult General anesthesia is the use of medicines to make a person "go to sleep" (be unconscious) for a medical procedure. General anesthesia is often recommended when a procedure:  Is long.  Requires you to be still or in an unusual position.  Is major and can cause you to lose blood.  Is impossible to do without general anesthesia.  The medicines used for general anesthesia are called general anesthetics. In addition to making you sleep, the medicines:  Prevent pain.  Control your blood pressure.  Relax your muscles.  Tell a health care provider about:  Any allergies you have.  All medicines you are taking, including vitamins, herbs, eye drops, creams, and over-the-counter medicines.  Any problems you or  family members have had with anesthetic medicines.  Types of anesthetics you have had in the past.  Any bleeding disorders you have.  Any surgeries you have had.  Any medical conditions you have.  Any history of heart or lung conditions, such as heart failure, sleep apnea, or chronic obstructive pulmonary disease (COPD).  Whether you are pregnant or may be pregnant.  Whether you use tobacco, alcohol, marijuana, or street drugs.  Any history of Armed forces logistics/support/administrative officer.  Any history of depression or anxiety. What are the risks? Generally, this is a safe procedure. However, problems may occur, including:  Allergic reaction to anesthetics.  Lung and heart problems.  Inhaling food or liquids from your stomach into your lungs (aspiration).  Injury to nerves.  Waking up during your procedure and being unable to move (rare).  Extreme agitation or a state of mental confusion (delirium) when you wake up from the anesthetic.  Air in the bloodstream, which can lead to stroke.  These problems are more likely to develop if you are having a major surgery or if you have  an advanced medical condition. You can prevent some of these complications by answering all of your health care provider's questions thoroughly and by following all pre-procedure instructions. General anesthesia can cause side effects, including:  Nausea or vomiting  A sore throat from the breathing tube.  Feeling cold or shivery.  Feeling tired, washed out, or achy.  Sleepiness or drowsiness.  Confusion or agitation.  What happens before the procedure? Staying hydrated Follow instructions from your health care provider about hydration, which may include:  Up to 2 hours before the procedure - you may continue to drink clear liquids, such as water, clear fruit juice, black coffee, and plain tea.  Eating and drinking restrictions Follow instructions from your health care provider about eating and drinking, which may  include:  8 hours before the procedure - stop eating heavy meals or foods such as meat, fried foods, or fatty foods.  6 hours before the procedure - stop eating light meals or foods, such as toast or cereal.  6 hours before the procedure - stop drinking milk or drinks that contain milk.  2 hours before the procedure - stop drinking clear liquids.  Medicines  Ask your health care provider about: ? Changing or stopping your regular medicines. This is especially important if you are taking diabetes medicines or blood thinners. ? Taking medicines such as aspirin and ibuprofen. These medicines can thin your blood. Do not take these medicines before your procedure if your health care provider instructs you not to. ? Taking new dietary supplements or medicines. Do not take these during the week before your procedure unless your health care provider approves them.  If you are told to take a medicine or to continue taking a medicine on the day of the procedure, take the medicine with sips of water. General instructions   Ask if you will be going home the same day, the following day, or after a longer hospital stay. ? Plan to have someone take you home. ? Plan to have someone stay with you for the first 24 hours after you leave the hospital or clinic.  For 3-6 weeks before the procedure, try not to use any tobacco products, such as cigarettes, chewing tobacco, and e-cigarettes.  You may brush your teeth on the morning of the procedure, but make sure to spit out the toothpaste. What happens during the procedure?  You will be given anesthetics through a mask and through an IV tube in one of your veins.  You may receive medicine to help you relax (sedative).  As soon as you are asleep, a breathing tube may be used to help you breathe.  An anesthesia specialist will stay with you throughout the procedure. He or she will help keep you comfortable and safe by continuing to give you medicines and  adjusting the amount of medicine that you get. He or she will also watch your blood pressure, pulse, and oxygen levels to make sure that the anesthetics do not cause any problems.  If a breathing tube was used to help you breathe, it will be removed before you wake up. The procedure may vary among health care providers and hospitals. What happens after the procedure?  You will wake up, often slowly, after the procedure is complete, usually in a recovery area.  Your blood pressure, heart rate, breathing rate, and blood oxygen level will be monitored until the medicines you were given have worn off.  You may be given medicine to help you calm down  if you feel anxious or agitated.  If you will be going home the same day, your health care provider may check to make sure you can stand, drink, and urinate.  Your health care providers will treat your pain and side effects before you go home.  Do not drive for 24 hours if you received a sedative.  You may: ? Feel nauseous and vomit. ? Have a sore throat. ? Have mental slowness. ? Feel cold or shivery. ? Feel sleepy. ? Feel tired. ? Feel sore or achy, even in parts of your body where you did not have surgery. This information is not intended to replace advice given to you by your health care provider. Make sure you discuss any questions you have with your health care provider. Document Released: 05/20/2007 Document Revised: 07/24/2015 Document Reviewed: 01/25/2015 Elsevier Interactive Patient Education  2018 [place] Anesthesia, Adult, Care After These instructions provide you with information about caring for yourself after your procedure. Your health care provider may also give you more specific instructions. Your treatment has been planned according to current medical practices, but problems sometimes occur. Call your health care provider if you have any problems or questions after your procedure. What can I expect after the  procedure? After the procedure, it is common to have:  Vomiting.  A sore throat.  Mental slowness.  It is common to feel:  Nauseous.  Cold or shivery.  Sleepy.  Tired.  Sore or achy, even in parts of your body where you did not have surgery.  Follow these instructions at home: For at least 24 hours after the procedure:  Do not: ? Participate in activities where you could fall or become injured. ? Drive. ? Use heavy machinery. ? Drink alcohol. ? Take sleeping pills or medicines that cause drowsiness. ? Make important decisions or sign legal documents. ? Take care of children on your own.  Rest. Eating and drinking  If you vomit, drink water, juice, or soup when you can drink without vomiting.  Drink enough fluid to keep your urine clear or pale yellow.  Make sure you have little or no nausea before eating solid foods.  Follow the diet recommended by your health care provider. General instructions  Have a responsible adult stay with you until you are awake and alert.  Return to your normal activities as told by your health care provider. Ask your health care provider what activities are safe for you.  Take over-the-counter and prescription medicines only as told by your health care provider.  If you smoke, do not smoke without supervision.  Keep all follow-up visits as told by your health care provider. This is important. Contact a health care provider if:  You continue to have nausea or vomiting at home, and medicines are not helpful.  You cannot drink fluids or start eating again.  You cannot urinate after 8-12 hours.  You develop a skin rash.  You have fever.  You have increasing redness at the site of your procedure. Get help right away if:  You have difficulty breathing.  You have chest pain.  You have unexpected bleeding.  You feel that you are having a life-threatening or urgent problem. This information is not intended to replace  advice given to you by your health care provider. Make sure you discuss any questions you have with your health care provider. Document Released: 05/19/2000 Document Revised: 07/16/2015 Document Reviewed: 01/25/2015 Elsevier Interactive Patient Education  [employer].

## 2017-07-30 ENCOUNTER — Other Ambulatory Visit: Payer: Self-pay

## 2017-07-30 ENCOUNTER — Other Ambulatory Visit: Payer: Self-pay | Admitting: Obstetrics and Gynecology

## 2017-07-30 ENCOUNTER — Encounter (HOSPITAL_COMMUNITY)
Admission: RE | Admit: 2017-07-30 | Discharge: 2017-07-30 | Disposition: A | Discharge status: Home / Self Care | Payer: BLUE CROSS/BLUE SHIELD | Source: Ambulatory Visit | Attending: Obstetrics and Gynecology | Admitting: Obstetrics and Gynecology

## 2017-07-30 ENCOUNTER — Encounter (HOSPITAL_COMMUNITY): Payer: Self-pay

## 2017-07-30 DIAGNOSIS — D251 Intramural leiomyoma of uterus: Secondary | ICD-10-CM | POA: Insufficient documentation

## 2017-07-30 DIAGNOSIS — Z01812 Encounter for preprocedural laboratory examination: Secondary | ICD-10-CM | POA: Insufficient documentation

## 2017-07-30 DIAGNOSIS — D250 Submucous leiomyoma of uterus: Secondary | ICD-10-CM | POA: Diagnosis not present

## 2017-07-30 HISTORY — DX: Gastro-esophageal reflux disease without esophagitis: K21.9

## 2017-07-30 LAB — URINALYSIS, ROUTINE W REFLEX MICROSCOPIC
BILIRUBIN URINE: NEGATIVE
Glucose, UA: NEGATIVE mg/dL
Ketones, ur: NEGATIVE mg/dL
LEUKOCYTES UA: NEGATIVE
NITRITE: NEGATIVE
PROTEIN: NEGATIVE mg/dL
Specific Gravity, Urine: 1.016 (ref 1.005–1.030)
pH: 6 (ref 5.0–8.0)

## 2017-07-30 LAB — COMPREHENSIVE METABOLIC PANEL
ALT: 23 U/L (ref 14–54)
ANION GAP: 7 (ref 5–15)
AST: 21 U/L (ref 15–41)
Albumin: 3.6 g/dL (ref 3.5–5.0)
Alkaline Phosphatase: 54 U/L (ref 38–126)
BUN: 11 mg/dL (ref 6–20)
CO2: 26 mmol/L (ref 22–32)
CREATININE: 1 mg/dL (ref 0.44–1.00)
Calcium: 8.8 mg/dL — ABNORMAL LOW (ref 8.9–10.3)
Chloride: 104 mmol/L (ref 101–111)
GFR calc non Af Amer: >60 mL/min (ref >60)
Glucose, Bld: 125 mg/dL — ABNORMAL HIGH (ref 65–99)
Potassium: 3.3 mmol/L — ABNORMAL LOW (ref 3.5–5.1)
Sodium: 137 mmol/L (ref 135–145)
Total Bilirubin: 0.6 mg/dL (ref 0.3–1.2)
Total Protein: 6.6 g/dL (ref 6.5–8.1)

## 2017-07-30 LAB — HCG, QUANTITATIVE, PREGNANCY: hCG, Beta Chain, Quant, S: <1 m[IU]/mL (ref <5)

## 2017-07-30 LAB — CBC
HCT: 37.2 % (ref 36.0–46.0)
Hemoglobin: 12.4 g/dL (ref 12.0–15.0)
MCH: 30 pg (ref 26.0–34.0)
MCHC: 33.3 g/dL (ref 30.0–36.0)
MCV: 89.9 fL (ref 78.0–100.0)
PLATELETS: 262 10*3/uL (ref 150–400)
RBC: 4.14 MIL/uL (ref 3.87–5.11)
RDW: 12.9 % (ref 11.5–15.5)
WBC: 3.8 10*3/uL — ABNORMAL LOW (ref 4.0–10.5)

## 2017-07-30 LAB — TYPE AND SCREEN
ABO/RH(D): A POS
Antibody Screen: NEGATIVE

## 2017-07-30 LAB — HCG, SERUM, QUALITATIVE: Preg, Serum: POSITIVE — AB

## 2017-08-03 NOTE — H&P (Signed)
Patient ID: Ashlee Stein, female   DOB: March 16, 1965, 52 y.o.   MRN: [id number] Preoperative History and Physical  Ashlee Stein is a 52 y.o. [id number] here for surgical management of uterine fibroids. She has been trying to lose weight, but has not lost much  No significant preoperative concerns.  Proposed surgery: HYSTERECTOMY SUPRACERVICAL ABDOMINAL and BILATERAL SALPINGECTOMY      Past Medical History:  Diagnosis Date  . Angina   . Asthma    "as a child"  . Coronary artery dissection    NSTEMI 8/12: cath with mid to dist LAD spont. dissection, unable to do PCI and referred for CABG:  SVG to LAD (Dr. PVT);  Carotid U/S 6/14:  Normal carotid arteries; FMD could not be ruled out  . Coronary artery dissection 10/24/2010  . Dyslipidemia   . Heart disease   . Hypertension   . Iron deficiency anemia   . Obesity    Past Surgical History:  Procedure Laterality Date  . CARDIAC SURGERY     open heart surgery  . CERVICAL POLYPECTOMY  07/01/2011   Procedure: CERVICAL POLYPECTOMY;  Surgeon: Jonnie Kind, MD;  Location: [facility];  Service: Gynecology;  Laterality: N/A;  Endometrial Polypectomy  . CORONARY ARTERY BYPASS GRAFT  10/24/2010   CABG X1:  coronary artery dissection -->CABG:  SVG to LAD (Dr. PVT)  . DILATION AND CURETTAGE OF UTERUS    . HYSTEROSCOPY W/ ENDOMETRIAL ABLATION    . LAPAROSCOPIC TUBAL LIGATION  07/01/2011   Procedure: LAPAROSCOPIC TUBAL LIGATION;  Surgeon: Jonnie Kind, MD;  Location: [facility];  Service: Gynecology;  Laterality: Bilateral;  Laparoscopic Bilateral Tubal Sterilization with Fallope Rings; ended at 1143  . UMBILICAL HERNIA REPAIR  07/01/2011   Procedure: HERNIA REPAIR UMBILICAL ADULT;  Surgeon: Jonnie Kind, MD;  Location: [facility];  Service: Gynecology;  Laterality: N/A;  during Laparoscopic Bilateral Tubal Sterilization                   OB History  Gravida Para Term Preterm AB Living  _0 SAB TAB Ectopic Multiple Live  Births             2       # Outcome Date GA Lbr Len/2nd Weight Sex Delivery Anes PTL Lv  2 Term     M Vag-Spont   LIV  1 Term     F Vag-Spont   LIV  Patient denies any other pertinent gynecologic issues.         Current Outpatient Medications on File Prior to Visit  Medication Sig Dispense Refill  . aspirin EC 81 MG tablet Take 1 tablet (81 mg total) by mouth daily.    Marland Kitchen atorvastatin (LIPITOR) 20 MG tablet Take 1 tablet (20 mg total) by mouth daily. 90 tablet 3  . lisinopril-hydrochlorothiazide (PRINZIDE,ZESTORETIC) 20-12.5 MG tablet Take 1 tablet by mouth 2 (two) times daily.   3  . metoprolol tartrate (LOPRESSOR) 25 MG tablet TAKE 1 & 1/2 TABLETS BY MOUTH TWICE A DAY (Patient taking differently: Take 37.5 mg by mouth 2 (two) times daily. TAKE 1 & 1/2 TABLETS BY MOUTH TWICE A DAY) 270 tablet 3  . Multiple Vitamin (MULITIVITAMIN WITH MINERALS) TABS Take 1 tablet daily by mouth. Every other day    . omeprazole (PRILOSEC) 40 MG capsule Take 40 mg as needed by mouth.   3  . Na Sulfate-K Sulfate-Mg Sulf (SUPREP BOWEL PREP KIT) 17.5-3.13-1.6 GM/180ML  SOLN Take 1 kit by mouth as directed. (Patient not taking: Reported on 06/03/2017) 1 Bottle 0   No current facility-administered medications on file prior to visit.         Allergies  Allergen Reactions  . Morphine And Related Rash  . Penicillins Rash    Has patient had a PCN reaction causing immediate rash, facial/tongue/throat swelling, SOB or lightheadedness with hypotension: yes Has patient had a PCN reaction causing severe rash involving mucus membranes or skin necrosis: no Has patient had a PCN reaction that required hospitalization: no Has patient had a PCN reaction occurring within the last 10 years: no If all of the above answers are "NO", then may proceed with Cephalosporin use.     Social History:   reports that she has never smoked. She has never used smokeless tobacco. She reports that she does not  drink alcohol or use drugs.       Family History  Problem Relation Age of Onset  . Heart attack Sister   . Diabetes Mother   . Hypertension Mother   . Heart disease Mother   . Cancer Brother 50       prostate  . Diabetes Sister   . Hypertension Sister   . Hypertension Sister     Review of Systems: Noncontributory  PHYSICAL EXAM: Blood pressure 128/74, height 5' 7.5" (1.715 m), weight 275 lb 12.8 oz (125.1 kg). General appearance - alert, well appearing, and in no distress, oriented to person, place, and time and overweight Mental status - alert, oriented to person, place, and time, normal mood, behavior, speech, dress, motor activity, and thought processes, affect appropriate to mood Chest - clear to auscultation, no wheezes, rales or rhonchi, symmetric air entry Heart - normal rate and regular rhythm Abdomen - soft, nontender, nondistended, no masses or organomegaly  PELVIC External genitalia - vitiligo and also on her thighs Vulva - normal  Vagina - normal Cervix - normal Uterus - 14-16 week size, mobile  Adnexa - negative  Labs: CBC Latest Ref Rng & Units 07/30/2017 09/18/2016 03/04/2014  WBC 4.0 - 10.5 K/uL 3.8(L) 3.7(L) 4.5  Hemoglobin 12.0 - 15.0 g/dL 12.4 13.0 12.4  Hematocrit 36.0 - 46.0 % 37.2 37.6 36.2  Platelets 150 - 400 K/uL 262 264 287   CMP Latest Ref Rng & Units 07/30/2017 09/18/2016 03/04/2014  Glucose 65 - 99 mg/dL 125(H) 119(H) 103(H)  BUN 6 - 20 mg/dL _0 Creatinine 0.44 - 1.00 mg/dL 1.00 1.14(H) 1.02  Sodium 135 - 145 mmol/L 137 137 138  Potassium 3.5 - 5.1 mmol/L 3.3(L) 3.5 3.7  Chloride 101 - 111 mmol/L 104 101 105  CO2 22 - 32 mmol/L _1 Calcium 8.9 - 10.3 mg/dL 8.8(L) 9.1 8.6  Total Protein 6.5 - 8.1 g/dL 6.6 - 6.2  Total Bilirubin 0.3 - 1.2 mg/dL 0.6 - 0.3  Alkaline Phos 38 - 126 U/L 54 - 64  AST 15 - 41 U/L 21 - 20  ALT 14 - 54 U/L 23 - 17      Imaging Studies: ImagingResults  No results found.     Assessment: 1. Obese     Patient Active Problem List   Diagnosis Date Noted  . Intramural and submucous leiomyoma of uterus 06/03/2017  . Diastasis recti 03/13/2017  . Epigastric hernia 03/13/2017  . Morbid obesity ([place]) 08/24/2015  . Dyspareunia 08/29/2013  . Umbilical hernia 42/68/3419  . Menorrhagia 06/29/2011  . Anemia 01/07/2011  . Cough  12/06/2010  . Chest pain 11/14/2010  . Coronary artery dissection   . Dyslipidemia   . Hypertension     Plan: 1. Patient will undergo surgical management with HYSTERECTOMY SUPRACERVICAL ABDOMINAL and BILATERAL SALPINGECTOMY on 08/04/2017 @ 715am 2. F/u post-op 6 weeks 3. Weight loss, Rx phentermine, advised to stop 3 days before surgery   By signing my name below, I, Margit Banda, attest that this documentation has been prepared under the direction and in the presence of Jonnie Kind, MD. Electronically Signed: Margit Banda, Medical Scribe. 07/08/17. 4:18 PM.  I personally performed the services described in this documentation, which was SCRIBED in my presence. The recorded information has been reviewed and considered accurate. It has been edited as necessary during review. Jonnie Kind, MD

## 2017-08-04 ENCOUNTER — Inpatient Hospital Stay (HOSPITAL_COMMUNITY): Payer: BLUE CROSS/BLUE SHIELD | Admitting: Anesthesiology

## 2017-08-04 ENCOUNTER — Inpatient Hospital Stay (HOSPITAL_COMMUNITY)
Admission: RE | Admit: 2017-08-04 | Discharge: 2017-08-06 | DRG: 742 | Disposition: A | Discharge status: Home / Self Care | Payer: BLUE CROSS/BLUE SHIELD | Source: Ambulatory Visit | Attending: Obstetrics and Gynecology | Admitting: Obstetrics and Gynecology

## 2017-08-04 ENCOUNTER — Encounter (HOSPITAL_COMMUNITY): Payer: Self-pay | Admitting: *Deleted

## 2017-08-04 ENCOUNTER — Other Ambulatory Visit: Payer: Self-pay

## 2017-08-04 ENCOUNTER — Encounter (HOSPITAL_COMMUNITY)
Admission: RE | Disposition: A | Discharge status: Home / Self Care | Payer: Self-pay | Source: Ambulatory Visit | Attending: Obstetrics and Gynecology

## 2017-08-04 DIAGNOSIS — I252 Old myocardial infarction: Secondary | ICD-10-CM

## 2017-08-04 DIAGNOSIS — J45909 Unspecified asthma, uncomplicated: Secondary | ICD-10-CM | POA: Diagnosis not present

## 2017-08-04 DIAGNOSIS — Z951 Presence of aortocoronary bypass graft: Secondary | ICD-10-CM

## 2017-08-04 DIAGNOSIS — E669 Obesity, unspecified: Secondary | ICD-10-CM | POA: Diagnosis not present

## 2017-08-04 DIAGNOSIS — K439 Ventral hernia without obstruction or gangrene: Secondary | ICD-10-CM | POA: Diagnosis present

## 2017-08-04 DIAGNOSIS — D250 Submucous leiomyoma of uterus: Secondary | ICD-10-CM | POA: Diagnosis not present

## 2017-08-04 DIAGNOSIS — E785 Hyperlipidemia, unspecified: Secondary | ICD-10-CM | POA: Diagnosis present

## 2017-08-04 DIAGNOSIS — I10 Essential (primary) hypertension: Secondary | ICD-10-CM | POA: Diagnosis not present

## 2017-08-04 DIAGNOSIS — K219 Gastro-esophageal reflux disease without esophagitis: Secondary | ICD-10-CM | POA: Diagnosis not present

## 2017-08-04 DIAGNOSIS — Z6841 Body Mass Index (BMI) 40.0 and over, adult: Secondary | ICD-10-CM

## 2017-08-04 DIAGNOSIS — D259 Leiomyoma of uterus, unspecified: Secondary | ICD-10-CM | POA: Diagnosis not present

## 2017-08-04 DIAGNOSIS — Z7982 Long term (current) use of aspirin: Secondary | ICD-10-CM | POA: Diagnosis not present

## 2017-08-04 DIAGNOSIS — Z90711 Acquired absence of uterus with remaining cervical stump: Secondary | ICD-10-CM | POA: Diagnosis present

## 2017-08-04 DIAGNOSIS — D251 Intramural leiomyoma of uterus: Secondary | ICD-10-CM | POA: Diagnosis not present

## 2017-08-04 HISTORY — PX: BILATERAL SALPINGECTOMY: SHX5743

## 2017-08-04 HISTORY — PX: VENTRAL HERNIA REPAIR: SHX424

## 2017-08-04 HISTORY — PX: SUPRACERVICAL ABDOMINAL HYSTERECTOMY: SHX5393

## 2017-08-04 SURGERY — HYSTERECTOMY, SUPRACERVICAL, ABDOMINAL
Anesthesia: General | Site: Uterus

## 2017-08-04 MED ORDER — HYDROMORPHONE HCL 1 MG/ML IJ SOLN
INTRAMUSCULAR | Status: AC
  Filled 2017-08-04: qty 0.5

## 2017-08-04 MED ORDER — PANTOPRAZOLE SODIUM 40 MG PO TBEC
40.0000 mg | DELAYED_RELEASE_TABLET | Freq: Every day | ORAL | Status: DC
  Administered 2017-08-05 – 2017-08-06 (×2): 40 mg via ORAL
  Filled 2017-08-04 (×2): qty 1

## 2017-08-04 MED ORDER — ONDANSETRON HCL 4 MG/2ML IJ SOLN
INTRAMUSCULAR | Status: AC
Start: 2017-08-04 — End: ?
  Filled 2017-08-04: qty 2

## 2017-08-04 MED ORDER — BUPIVACAINE HCL (PF) 0.5 % IJ SOLN
INTRAMUSCULAR | Status: DC | PRN
  Administered 2017-08-04: 30 mL

## 2017-08-04 MED ORDER — DIPHENHYDRAMINE HCL 50 MG/ML IJ SOLN: 12.5000 mg | Freq: Four times a day (QID) | INTRAMUSCULAR | Status: DC | PRN

## 2017-08-04 MED ORDER — CLINDAMYCIN PHOSPHATE 900 MG/50ML IV SOLN
900.0000 mg | INTRAVENOUS | Status: AC
  Administered 2017-08-04: 900 mg via INTRAVENOUS

## 2017-08-04 MED ORDER — LIDOCAINE HCL (CARDIAC) PF 100 MG/5ML IV SOSY
PREFILLED_SYRINGE | INTRAVENOUS | Status: DC | PRN
  Administered 2017-08-04: 30 mg via INTRAVENOUS

## 2017-08-04 MED ORDER — 0.9 % SODIUM CHLORIDE (POUR BTL) OPTIME
TOPICAL | Status: DC | PRN
  Administered 2017-08-04 (×2): 1000 mL

## 2017-08-04 MED ORDER — FENTANYL CITRATE (PF) 100 MCG/2ML IJ SOLN
INTRAMUSCULAR | Status: DC | PRN
  Administered 2017-08-04 (×6): 50 ug via INTRAVENOUS

## 2017-08-04 MED ORDER — CEFAZOLIN SODIUM-DEXTROSE 2-4 GM/100ML-% IV SOLN: 2.0000 g | Freq: Once | INTRAVENOUS | Status: DC

## 2017-08-04 MED ORDER — LACTATED RINGERS IV SOLN
INTRAVENOUS | Status: DC
  Administered 2017-08-04: 1000 mL via INTRAVENOUS
  Administered 2017-08-04 (×2): via INTRAVENOUS

## 2017-08-04 MED ORDER — LISINOPRIL-HYDROCHLOROTHIAZIDE 20-12.5 MG PO TABS: 1.0000 | ORAL_TABLET | Freq: Two times a day (BID) | ORAL | Status: DC

## 2017-08-04 MED ORDER — ONDANSETRON HCL 4 MG/2ML IJ SOLN: 4.0000 mg | Freq: Four times a day (QID) | INTRAMUSCULAR | Status: DC | PRN

## 2017-08-04 MED ORDER — SODIUM CHLORIDE 0.9% FLUSH: 9.0000 mL | INTRAVENOUS | Status: DC | PRN

## 2017-08-04 MED ORDER — ATORVASTATIN CALCIUM 20 MG PO TABS
20.0000 mg | ORAL_TABLET | Freq: Every day | ORAL | Status: DC
  Administered 2017-08-05 – 2017-08-06 (×2): 20 mg via ORAL
  Filled 2017-08-04 (×2): qty 1

## 2017-08-04 MED ORDER — FENTANYL 40 MCG/ML IV SOLN
INTRAVENOUS | Status: DC
  Administered 2017-08-04: 90 ug via INTRAVENOUS
  Administered 2017-08-04: 8 ug via INTRAVENOUS
  Administered 2017-08-04: 1000 ug via INTRAVENOUS
  Administered 2017-08-05: 15 ug via INTRAVENOUS
  Administered 2017-08-05: 45 ug via INTRAVENOUS
  Filled 2017-08-04 (×2): qty 25

## 2017-08-04 MED ORDER — MIDAZOLAM HCL 5 MG/5ML IJ SOLN
INTRAMUSCULAR | Status: DC | PRN
  Administered 2017-08-04: 2 mg via INTRAVENOUS

## 2017-08-04 MED ORDER — NALOXONE HCL 0.4 MG/ML IJ SOLN: 0.4000 mg | INTRAMUSCULAR | Status: DC | PRN

## 2017-08-04 MED ORDER — ENOXAPARIN SODIUM 40 MG/0.4ML ~~LOC~~ SOLN
40.0000 mg | SUBCUTANEOUS | Status: DC
  Administered 2017-08-05 – 2017-08-06 (×2): 40 mg via SUBCUTANEOUS
  Filled 2017-08-04 (×2): qty 0.4

## 2017-08-04 MED ORDER — PROMETHAZINE HCL 25 MG/ML IJ SOLN: 6.2500 mg | INTRAMUSCULAR | Status: DC | PRN

## 2017-08-04 MED ORDER — MEPERIDINE HCL 50 MG/ML IJ SOLN: 6.2500 mg | INTRAMUSCULAR | Status: DC | PRN

## 2017-08-04 MED ORDER — PROPOFOL 10 MG/ML IV BOLUS
INTRAVENOUS | Status: AC
  Filled 2017-08-04: qty 40

## 2017-08-04 MED ORDER — CEFAZOLIN SODIUM-DEXTROSE 2-4 GM/100ML-% IV SOLN
INTRAVENOUS | Status: AC
  Filled 2017-08-04: qty 100

## 2017-08-04 MED ORDER — KETOROLAC TROMETHAMINE 30 MG/ML IJ SOLN
30.0000 mg | Freq: Four times a day (QID) | INTRAMUSCULAR | Status: DC
  Administered 2017-08-04 – 2017-08-05 (×3): 30 mg via INTRAVENOUS
  Filled 2017-08-04 (×3): qty 1

## 2017-08-04 MED ORDER — SUGAMMADEX SODIUM 500 MG/5ML IV SOLN
INTRAVENOUS | Status: DC | PRN
  Administered 2017-08-04: 245.8 mg via INTRAVENOUS

## 2017-08-04 MED ORDER — HYDROCHLOROTHIAZIDE 12.5 MG PO CAPS
12.5000 mg | ORAL_CAPSULE | Freq: Two times a day (BID) | ORAL | Status: DC
  Administered 2017-08-04 – 2017-08-06 (×4): 12.5 mg via ORAL
  Filled 2017-08-04 (×4): qty 1

## 2017-08-04 MED ORDER — SUCCINYLCHOLINE CHLORIDE 20 MG/ML IJ SOLN
INTRAMUSCULAR | Status: DC | PRN
  Administered 2017-08-04: 140 mg via INTRAVENOUS

## 2017-08-04 MED ORDER — LISINOPRIL 10 MG PO TABS
20.0000 mg | ORAL_TABLET | Freq: Two times a day (BID) | ORAL | Status: DC
  Administered 2017-08-04 – 2017-08-06 (×4): 20 mg via ORAL
  Filled 2017-08-04 (×4): qty 2

## 2017-08-04 MED ORDER — DEXAMETHASONE SODIUM PHOSPHATE 4 MG/ML IJ SOLN
INTRAMUSCULAR | Status: DC | PRN
  Administered 2017-08-04: 4 mg via INTRAVENOUS

## 2017-08-04 MED ORDER — ROCURONIUM BROMIDE 100 MG/10ML IV SOLN
INTRAVENOUS | Status: DC | PRN
  Administered 2017-08-04: 10 mg via INTRAVENOUS
  Administered 2017-08-04: 40 mg via INTRAVENOUS

## 2017-08-04 MED ORDER — FENTANYL CITRATE (PF) 100 MCG/2ML IJ SOLN
INTRAMUSCULAR | Status: AC
  Filled 2017-08-04: qty 2

## 2017-08-04 MED ORDER — DEXAMETHASONE SODIUM PHOSPHATE 4 MG/ML IJ SOLN
INTRAMUSCULAR | Status: AC
  Filled 2017-08-04: qty 1

## 2017-08-04 MED ORDER — LACTATED RINGERS IV SOLN: INTRAVENOUS | Status: DC

## 2017-08-04 MED ORDER — CLINDAMYCIN PHOSPHATE 900 MG/50ML IV SOLN
INTRAVENOUS | Status: AC
  Filled 2017-08-04: qty 50

## 2017-08-04 MED ORDER — KETOROLAC TROMETHAMINE 30 MG/ML IJ SOLN
30.0000 mg | Freq: Once | INTRAMUSCULAR | Status: AC
  Administered 2017-08-04: 30 mg via INTRAVENOUS

## 2017-08-04 MED ORDER — ONDANSETRON HCL 4 MG/2ML IJ SOLN
INTRAMUSCULAR | Status: DC | PRN
  Administered 2017-08-04: 4 mg via INTRAVENOUS

## 2017-08-04 MED ORDER — BUPIVACAINE HCL (PF) 0.5 % IJ SOLN
INTRAMUSCULAR | Status: AC
Start: 2017-08-04 — End: ?
  Filled 2017-08-04: qty 30

## 2017-08-04 MED ORDER — METRONIDAZOLE IN NACL 5-0.79 MG/ML-% IV SOLN
500.0000 mg | INTRAVENOUS | Status: AC
  Administered 2017-08-04: 500 mg via INTRAVENOUS

## 2017-08-04 MED ORDER — KETOROLAC TROMETHAMINE 30 MG/ML IJ SOLN
INTRAMUSCULAR | Status: AC
  Filled 2017-08-04: qty 1

## 2017-08-04 MED ORDER — ONDANSETRON HCL 4 MG/2ML IJ SOLN
INTRAMUSCULAR | Status: AC
  Filled 2017-08-04: qty 4

## 2017-08-04 MED ORDER — DIPHENHYDRAMINE HCL 12.5 MG/5ML PO ELIX: 12.5000 mg | ORAL_SOLUTION | Freq: Four times a day (QID) | ORAL | Status: DC | PRN

## 2017-08-04 MED ORDER — KETOROLAC TROMETHAMINE 30 MG/ML IJ SOLN
30.0000 mg | Freq: Four times a day (QID) | INTRAMUSCULAR | Status: DC
  Administered 2017-08-05 (×2): 30 mg via INTRAMUSCULAR
  Filled 2017-08-04 (×2): qty 1

## 2017-08-04 MED ORDER — ONDANSETRON HCL 4 MG/2ML IJ SOLN
INTRAMUSCULAR | Status: AC
  Filled 2017-08-04: qty 2

## 2017-08-04 MED ORDER — SODIUM CHLORIDE 0.9 % IV SOLN
INTRAVENOUS | Status: DC
  Administered 2017-08-04 – 2017-08-05 (×3): via INTRAVENOUS

## 2017-08-04 MED ORDER — METOPROLOL TARTRATE 25 MG PO TABS
37.5000 mg | ORAL_TABLET | Freq: Two times a day (BID) | ORAL | Status: DC
  Administered 2017-08-04 – 2017-08-06 (×4): 37.5 mg via ORAL
  Filled 2017-08-04 (×4): qty 2

## 2017-08-04 MED ORDER — PROPOFOL 10 MG/ML IV BOLUS
INTRAVENOUS | Status: DC | PRN
  Administered 2017-08-04: 200 mg via INTRAVENOUS
  Administered 2017-08-04: 50 mg via INTRAVENOUS

## 2017-08-04 MED ORDER — HYDROMORPHONE HCL 1 MG/ML IJ SOLN
0.2500 mg | INTRAMUSCULAR | Status: DC | PRN
  Administered 2017-08-04 (×2): 0.5 mg via INTRAVENOUS

## 2017-08-04 MED ORDER — IBUPROFEN 600 MG PO TABS: 600.0000 mg | ORAL_TABLET | Freq: Four times a day (QID) | ORAL | Status: DC | PRN

## 2017-08-04 MED ORDER — ARTIFICIAL TEARS OPHTHALMIC OINT
TOPICAL_OINTMENT | OPHTHALMIC | Status: AC
  Filled 2017-08-04: qty 3.5

## 2017-08-04 MED ORDER — ONDANSETRON HCL 4 MG PO TABS: 4.0000 mg | ORAL_TABLET | Freq: Four times a day (QID) | ORAL | Status: DC | PRN

## 2017-08-04 MED ORDER — ROCURONIUM BROMIDE 50 MG/5ML IV SOLN
INTRAVENOUS | Status: AC
  Filled 2017-08-04: qty 1

## 2017-08-04 MED ORDER — SUGAMMADEX SODIUM 500 MG/5ML IV SOLN
INTRAVENOUS | Status: AC
  Filled 2017-08-04: qty 5

## 2017-08-04 MED ORDER — MIDAZOLAM HCL 2 MG/2ML IJ SOLN
INTRAMUSCULAR | Status: AC
  Filled 2017-08-04: qty 2

## 2017-08-04 MED ORDER — FENTANYL CITRATE (PF) 250 MCG/5ML IJ SOLN
INTRAMUSCULAR | Status: AC
  Filled 2017-08-04: qty 5

## 2017-08-04 MED ORDER — LIDOCAINE HCL (PF) 1 % IJ SOLN
INTRAMUSCULAR | Status: AC
  Filled 2017-08-04: qty 5

## 2017-08-04 MED ORDER — SUCCINYLCHOLINE CHLORIDE 20 MG/ML IJ SOLN
INTRAMUSCULAR | Status: AC
  Filled 2017-08-04: qty 1

## 2017-08-04 SURGICAL SUPPLY — 55 items
BENZOIN TINCTURE PRP APPL 2/3 (GAUZE/BANDAGES/DRESSINGS) ×5 IMPLANT
BLADE SURG SZ10 CARB STEEL (BLADE) ×5 IMPLANT
CLOSURE WOUND 1/2 X4 (GAUZE/BANDAGES/DRESSINGS) ×1
CLOTH BEACON ORANGE TIMEOUT ST (SAFETY) ×5 IMPLANT
COVER LIGHT HANDLE STERIS (MISCELLANEOUS) ×10 IMPLANT
DECANTER SPIKE VIAL GLASS SM (MISCELLANEOUS) ×5 IMPLANT
DRAPE WARM FLUID 44X44 (DRAPE) ×5 IMPLANT
DRSG OPSITE POSTOP 4X10 (GAUZE/BANDAGES/DRESSINGS) ×5 IMPLANT
DRSG OPSITE POSTOP 4X8 (GAUZE/BANDAGES/DRESSINGS) ×5 IMPLANT
DRSG TEGADERM 2-3/8X2-3/4 SM (GAUZE/BANDAGES/DRESSINGS) ×5 IMPLANT
DURAPREP 26ML APPLICATOR (WOUND CARE) ×5 IMPLANT
ELECT REM PT RETURN 9FT ADLT (ELECTROSURGICAL) ×5
ELECTRODE REM PT RTRN 9FT ADLT (ELECTROSURGICAL) ×3 IMPLANT
EVACUATOR DRAINAGE 10X20 100CC (DRAIN) ×3 IMPLANT
EVACUATOR SILICONE 100CC (DRAIN) ×2
GAUZE SPONGE 4X4 12PLY STRL (GAUZE/BANDAGES/DRESSINGS) ×5 IMPLANT
GLOVE BIO SURGEON STRL SZ7 (GLOVE) ×10 IMPLANT
GLOVE BIOGEL PI IND STRL 7.0 (GLOVE) ×12 IMPLANT
GLOVE BIOGEL PI IND STRL 9 (GLOVE) ×3 IMPLANT
GLOVE BIOGEL PI INDICATOR 7.0 (GLOVE) ×8
GLOVE BIOGEL PI INDICATOR 9 (GLOVE) ×2
GLOVE ECLIPSE 9.0 STRL (GLOVE) ×5 IMPLANT
GOWN SPEC L3 XXLG W/TWL (GOWN DISPOSABLE) ×5 IMPLANT
GOWN STRL REUS W/TWL LRG LVL3 (GOWN DISPOSABLE) ×10 IMPLANT
INST SET MAJOR GENERAL (KITS) ×5 IMPLANT
KIT BLADEGUARD II DBL (SET/KITS/TRAYS/PACK) ×5 IMPLANT
KIT TURNOVER KIT A (KITS) ×5 IMPLANT
MANIFOLD NEPTUNE II (INSTRUMENTS) ×5 IMPLANT
NEEDLE HYPO 25X1 1.5 SAFETY (NEEDLE) ×5 IMPLANT
NS IRRIG 1000ML POUR BTL (IV SOLUTION) ×10 IMPLANT
PACK ABDOMINAL MAJOR (CUSTOM PROCEDURE TRAY) ×5 IMPLANT
PAD ARMBOARD 7.5X6 YLW CONV (MISCELLANEOUS) ×5 IMPLANT
RETRACTOR WND ALEXIS 25 LRG (MISCELLANEOUS) ×3 IMPLANT
RTRCTR WOUND ALEXIS 25CM LRG (MISCELLANEOUS) ×5
SET BASIN LINEN APH (SET/KITS/TRAYS/PACK) ×5 IMPLANT
SPONGE DRAIN TRACH 4X4 STRL 2S (GAUZE/BANDAGES/DRESSINGS) ×5 IMPLANT
SPONGE GAUZE 2X2 8PLY STER LF (GAUZE/BANDAGES/DRESSINGS) ×1
SPONGE GAUZE 2X2 8PLY STRL LF (GAUZE/BANDAGES/DRESSINGS) ×4 IMPLANT
SPONGE LAP 18X18 X RAY DECT (DISPOSABLE) ×10 IMPLANT
STRIP CLOSURE SKIN 1/2X4 (GAUZE/BANDAGES/DRESSINGS) ×4 IMPLANT
SUT CHROMIC 0 CT 1 (SUTURE) ×40 IMPLANT
SUT CHROMIC 2 0 CT 1 (SUTURE) ×10 IMPLANT
SUT ETHILON 3 0 FSL (SUTURE) ×5 IMPLANT
SUT PDS AB CT VIOLET #0 27IN (SUTURE) ×5 IMPLANT
SUT PLAIN CT 1/2CIR 2-0 27IN (SUTURE) ×15 IMPLANT
SUT PROLENE 2 0 SH 30 (SUTURE) ×10 IMPLANT
SUT VIC AB 0 CT1 27 (SUTURE) ×8
SUT VIC AB 0 CT1 27XBRD ANTBC (SUTURE) ×6 IMPLANT
SUT VIC AB 0 CT1 27XCR 8 STRN (SUTURE) ×6 IMPLANT
SUT VICRYL 4 0 KS 27 (SUTURE) ×10 IMPLANT
SYR BULB IRRIGATION 50ML (SYRINGE) ×5 IMPLANT
SYR CONTROL 10ML LL (SYRINGE) ×5 IMPLANT
TOWEL BLUE STERILE X RAY DET (MISCELLANEOUS) ×5 IMPLANT
TOWEL OR 17X26 4PK STRL BLUE (TOWEL DISPOSABLE) ×5 IMPLANT
TRAY FOLEY MTR SLVR 16FR STAT (SET/KITS/TRAYS/PACK) ×5 IMPLANT

## 2017-08-04 NOTE — Op Note (Signed)
Please see the brief operative note for surgical details 

## 2017-08-04 NOTE — Op Note (Signed)
08/04/2017  10:50 AM  PATIENT:  Ashlee Stein  52 y.o. female  PRE-OPERATIVE DIAGNOSIS:  Intramural and Submucous Leiomyoma  POST-OPERATIVE DIAGNOSIS:  Intramural and Submucous Leiomyoma, VENTRAL HERNIA  PROCEDURE:  Procedure(s): HYSTERECTOMY SUPRACERVICAL ABDOMINAL (N/A) BILATERAL SALPINGECTOMY (Bilateral) HERNIA REPAIR VENTRAL ADULT (N/A)  SURGEON:  Surgeon(s) and Role:    Jonnie Kind, MD - Primary  PHYSICIAN ASSISTANT:   ASSISTANTS: Marquita Palms RNFA  ANESTHESIA:   local and general  EBL:  150 mL   BLOOD ADMINISTERED:none  DRAINS: (10 mm) Jackson-Pratt drain(s) with closed bulb suction in the Subcutaneous space   LOCAL MEDICATIONS USED:  MARCAINE    and Amount: 30 ml  SPECIMEN:  Source of Specimen:  Uterus, fallopian tubes, hernia sac  DISPOSITION OF SPECIMEN:  PATHOLOGY  COUNTS:  YES  Patient taken the operating room prepped and draped for lower abdominal surgery with transverse Pelosi type incision planned.  Skin was marked from anterior superior iliac crest to the midline removing an 8 cm wide ellipse of skin and underlying fatty tissue, then proceeding with a midline vertical entry into the abdominal cavity.  The uterus was enlarged almost to the umbilicus but quite mobile.  The uterus could be rotated sufficiently that the left round ligament could be identified clamped cut and suture-ligated.  The left utero-ovarian ligament could be identified was doubly clamped with curved Heaney clamps, transected and doubly ligated with 0 Vicryl suture used here and throughout the case except as otherwise noted uterine bladder flap was then developed, the uterine vessels on the left side were Skeletonized doubly clamped with curved Heaney clamps, Kelly clamp placed for backbleeding, transected and doubly ligated with 0 Vicryl the upper cardinal ligaments were then clamped cut and suture-ligated with 0 Vicryl.  There was a broad ligament fibroid was shelled out without  difficulty, approximately 4 cm in diameter and was sent with histology sent .    The opposite side was treated in similar fashion.  Once the uterine vessels were clamped cut and suture-ligated the uterus could be exteriorized from the one additional clamping on the upper cardinal ligaments were done on each side clamping cutting and suture ligating and tagging the pedicle.  The lower uterine segment was cored out in such a way that it could be closed front to back.  This was done with the Bovie cautery while protecting the bowel with laparotomy moistened gauze and malleable retractor the uterine segment stump could then be reapproximated from front to back with good hemostasis.  The upper cardinal ligament pedicle was tied to the front to back sutures closing the cervical stump. Attention was then directed to the adnexa with fallopian tubes amputated by freeing up the proximal portion of the mesosalpinx, trans-dissecting the distal portion of the salpinx with Kelly clamp in place.  This pedicle was suture-ligated.  Hemostasis satisfactory.  Pelvis was irrigated and attention done to inspect the upper abdomen by palpation.  There was a anterior hernia approximately 2 cm in diameter that had omental fat apparent.  This could be explored with index finger from inside the abdominal cavity and it was found to with large amount of omentum in the hernia sac.  It was felt that this could be easily surgically corrected so a midline vertical incision was made 2 cm above the umbilicus for a distance approximately 3 cm in the sac shelled out, the omentum pushed back into the abdominal cavity, and then the hernia sac shelled out and trimmed.  The edges  of the fascia could be identified and were grasped with Coker clamps above and below.  The hernia sac edges were reapproximated with continuous running 2-0 chromic, then the fascia closed with interrupted 2-0 Prolene in the hernia completely eliminated from the upper abdomen.   Subcutaneous fatty tissues were reapproximated using horizontal mattress sutures of 2 oh plain and then the skin edges were left for closure at all later in the case.  The lower abdominal pedicles were inspected again confirmed as hemostatic, the peritoneum closed anteriorly with 2-0 chromic, the fascia closed with continuous running 0 PDS with good approximation achieved.  Subcutaneous fatty tissue was mobilized sufficiently to close the [facility] type incision with a series of interrupted horizontal mattress sutures of 2 oh plain in the subcu fatty space, followed by placement of the deep fatty tissue JP drain will add exit through the right lower quadrant inferior to the skin incision, then the skin was closed at both surgical sites with subcuticular 4-0 Vicryl.  JP drain was placed to use closed suction using the bulb suction syringe and sewn in place.  Patient tolerated procedure well went to recovery room in stable condition TOURNIQUET:  * No tourniquets in log *  DICTATION: .Dragon Dictation  PLAN OF CARE: Admit to inpatient   PATIENT DISPOSITION:  PACU - hemodynamically stable.   Delay start of Pharmacological VTE agent (>24hrs) due to surgical blood loss or risk of bleeding: not applicable

## 2017-08-04 NOTE — Progress Notes (Signed)
Patient ambulated from bed to hallway three times with nurse. Patient tolerated well with no complaints.

## 2017-08-04 NOTE — Anesthesia Preprocedure Evaluation (Addendum)
Anesthesia Evaluation  Patient identified by MRN, date of birth, ID band Patient awake    Reviewed: Allergy & Precautions, H&P , NPO status , Patient's Chart, lab work & pertinent test results, reviewed documented beta blocker date and time   Airway Mallampati: III  TM Distance: >3 FB Neck ROM: full    Dental no notable dental hx. (+) Teeth Intact, Dental Advidsory Given   Pulmonary neg pulmonary ROS, asthma ,    Pulmonary exam normal breath sounds clear to auscultation       Cardiovascular Exercise Tolerance: Good hypertension, On Medications + CAD  negative cardio ROS   Rhythm:regular Rate:Normal     Neuro/Psych  Neuromuscular disease negative neurological ROS  negative psych ROS   GI/Hepatic negative GI ROS, Neg liver ROS, GERD  ,  Endo/Other  negative endocrine ROSMorbid obesity  Renal/GU negative Renal ROS  negative genitourinary   Musculoskeletal   Abdominal   Peds  Hematology negative hematology ROS (+) anemia ,   Anesthesia Other Findings hgb 12.2  plt 262  k 3.3 EKG SB 58 indicative of old infarct Denies "anginal", rather pain from "Artery dissection?" Aorta? With graft in 2012.  Reproductive/Obstetrics negative OB ROS                            Anesthesia Physical Anesthesia Plan  ASA: III  Anesthesia Plan: General ETT   Post-op Pain Management:    Induction:   PONV Risk Score and Plan: Ondansetron  Airway Management Planned:   Additional Equipment:   Intra-op Plan:   Post-operative Plan:   Informed Consent: I have reviewed the patients History and Physical, chart, labs and discussed the procedure including the risks, benefits and alternatives for the proposed anesthesia with the patient or authorized representative who has indicated his/her understanding and acceptance.   Dental Advisory Given  Plan Discussed with: CRNA and Anesthesiologist  Anesthesia Plan  Comments:        Anesthesia Quick Evaluation

## 2017-08-04 NOTE — Anesthesia Procedure Notes (Signed)
Procedure Name: Intubation Date/Time: 08/04/2017 7:41 AM Performed by: Andree Elk, Amy A, CRNA Pre-anesthesia Checklist: Patient identified, Patient being monitored, Timeout performed, Emergency Drugs available and Suction available Patient Re-evaluated:Patient Re-evaluated prior to induction Oxygen Delivery Method: Circle System Utilized and Circle system utilized Preoxygenation: Pre-oxygenation with 100% oxygen Induction Type: IV induction, Rapid sequence and Cricoid Pressure applied Ventilation: Mask ventilation without difficulty Laryngoscope Size: Mac and 3 Grade View: Grade I Tube type: Oral Tube size: 7.0 mm Number of attempts: 1 Airway Equipment and Method: Stylet Placement Confirmation: ETT inserted through vocal cords under direct vision,  positive ETCO2 and breath sounds checked- equal and bilateral Secured at: 21 cm Tube secured with: Tape Dental Injury: Teeth and Oropharynx as per pre-operative assessment

## 2017-08-04 NOTE — Interval H&P Note (Signed)
History and Physical Interval Note:  08/04/2017 7:31 AM  Ashlee Stein  has presented today for surgery, with the diagnosis of Intramural and Submucous Leiomyoma  The various methods of treatment have been discussed with the patient and family. After consideration of risks, benefits and other options for treatment, the patient has consented to  Procedure(s): HYSTERECTOMY SUPRACERVICAL ABDOMINAL (N/A) BILATERAL SALPINGECTOMY (Bilateral) as a surgical intervention .  The patient's history has been reviewed, patient examined, no change in status, stable for surgery.  I have reviewed the patient's chart and labs.  Questions were answered to the patient's satisfaction.     Jonnie Kind

## 2017-08-04 NOTE — Anesthesia Postprocedure Evaluation (Signed)
Anesthesia Post Note  Patient: Gertie Exon  Procedure(s) Performed: HYSTERECTOMY SUPRACERVICAL ABDOMINAL (N/A Uterus) BILATERAL SALPINGECTOMY (Bilateral Abdomen) HERNIA REPAIR VENTRAL ADULT (N/A Abdomen)  Patient location during evaluation: PACU Anesthesia Type: General Level of consciousness: awake, oriented and patient cooperative Pain management: pain level controlled Vital Signs Assessment: post-procedure vital signs reviewed and stable Respiratory status: spontaneous breathing Cardiovascular status: stable Postop Assessment: no apparent nausea or vomiting Anesthetic complications: no     Last Vitals:  Vitals:   08/04/17 1030 08/04/17 1045  BP: 125/78 121/84  Pulse: 80 75  Resp: 17 18  Temp: 36.5 C   SpO2: 99% 100%    Last Pain:  Vitals:   08/04/17 1045  TempSrc:   PainSc: Asleep                 ADAMS, AMY A

## 2017-08-04 NOTE — Transfer of Care (Signed)
Immediate Anesthesia Transfer of Care Note  Patient: Ashlee Stein  Procedure(s) Performed: HYSTERECTOMY SUPRACERVICAL ABDOMINAL (N/A Uterus) BILATERAL SALPINGECTOMY (Bilateral Abdomen) HERNIA REPAIR VENTRAL ADULT (N/A Abdomen)  Patient Location: PACU  Anesthesia Type:General  Level of Consciousness: awake, drowsy and patient cooperative  Airway & Oxygen Therapy: Patient Spontanous Breathing  Post-op Assessment: Report given to RN and Post -op Vital signs reviewed and stable  Post vital signs: Reviewed and stable  Last Vitals:  Vitals Value Taken Time  BP 127/78 08/04/2017 10:23 AM  Temp    Pulse 86 08/04/2017 10:27 AM  Resp 16 08/04/2017 10:27 AM  SpO2 100 % 08/04/2017 10:27 AM  Vitals shown include unvalidated device data.  Last Pain:  Vitals:   08/04/17 0711  TempSrc: Oral      Patients Stated Pain Goal: 7 (13/24/40 1027)  Complications: No apparent anesthesia complications

## 2017-08-05 ENCOUNTER — Encounter (HOSPITAL_COMMUNITY): Payer: Self-pay | Admitting: Obstetrics and Gynecology

## 2017-08-05 LAB — CBC
HEMATOCRIT: 32.1 % — AB (ref 36.0–46.0)
HEMOGLOBIN: 10.6 g/dL — AB (ref 12.0–15.0)
MCH: 30.1 pg (ref 26.0–34.0)
MCHC: 33 g/dL (ref 30.0–36.0)
MCV: 91.2 fL (ref 78.0–100.0)
Platelets: 230 10*3/uL (ref 150–400)
RBC: 3.52 MIL/uL — ABNORMAL LOW (ref 3.87–5.11)
RDW: 12.8 % (ref 11.5–15.5)
WBC: 8 10*3/uL (ref 4.0–10.5)

## 2017-08-05 LAB — BASIC METABOLIC PANEL
Anion gap: 8 (ref 5–15)
BUN: 8 mg/dL (ref 6–20)
CHLORIDE: 103 mmol/L (ref 101–111)
CO2: 28 mmol/L (ref 22–32)
CREATININE: 0.97 mg/dL (ref 0.44–1.00)
Calcium: 8 mg/dL — ABNORMAL LOW (ref 8.9–10.3)
GFR calc Af Amer: >60 mL/min (ref >60)
GFR calc non Af Amer: >60 mL/min (ref >60)
Glucose, Bld: 115 mg/dL — ABNORMAL HIGH (ref 65–99)
POTASSIUM: 3.2 mmol/L — AB (ref 3.5–5.1)
SODIUM: 139 mmol/L (ref 135–145)

## 2017-08-05 NOTE — Progress Notes (Signed)
1 Day Post-Op Procedure(s) (LRB): HYSTERECTOMY SUPRACERVICAL ABDOMINAL (N/A) BILATERAL SALPINGECTOMY (Bilateral) HERNIA REPAIR VENTRAL ADULT (N/A)  Subjective: Patient reports incisional pain, tolerating PO and no problems voiding.    Objective: I have reviewed patient's vital signs, intake and output and labs.  Intake/Output Summary (Last 24 hours) at 08/05/2017 0733 Last data filed at 08/05/2017 0600 Gross per 24 hour  Intake 4699.58 ml  Output 3400 ml  Net 1299.58 ml    General: alert, cooperative and no distress GI: soft, non-tender; bowel sounds normal; no masses,  no organomegaly Extremities: Homans sign is negative, no sign of DVT Vaginal Bleeding: none CBC Latest Ref Rng & Units 08/05/2017 07/30/2017 09/18/2016  WBC 4.0 - 10.5 K/uL 8.0 3.8(L) 3.7(L)  Hemoglobin 12.0 - 15.0 g/dL 10.6(L) 12.4 13.0  Hematocrit 36.0 - 46.0 % 32.1(L) 37.2 37.6  Platelets 150 - 400 K/uL 230 262 264   BMP Latest Ref Rng & Units 08/05/2017 07/30/2017 09/18/2016  Glucose 65 - 99 mg/dL 115(H) 125(H) 119(H)  BUN 6 - 20 mg/dL 8 11 11   Creatinine 0.44 - 1.00 mg/dL 0.97 1.00 1.14(H)  Sodium 135 - 145 mmol/L 139 137 137  Potassium 3.5 - 5.1 mmol/L 3.2(L) 3.3(L) 3.5  Chloride 101 - 111 mmol/L 103 104 101  CO2 22 - 32 mmol/L 28 26 24   Calcium 8.9 - 10.3 mg/dL 8.0(L) 8.8(L) 9.1    Assessment: s/p Procedure(s): HYSTERECTOMY SUPRACERVICAL ABDOMINAL (N/A) BILATERAL SALPINGECTOMY (Bilateral) HERNIA REPAIR VENTRAL ADULT (N/A): stable  Plan: Advance diet Advance to PO medication Discontinue IV fluids probable d/c in a.m.  LOS: 1 day    Jonnie Kind 08/05/2017, 7:33 AM

## 2017-08-05 NOTE — Progress Notes (Signed)
Removed foley catheter per order at 0600, 08/05/17. Patient also ambulated in room from bed to hallway 4 times, tolerated well. O2 was above 92 during ambulation.

## 2017-08-06 MED ORDER — OXYCODONE-ACETAMINOPHEN 5-325 MG PO TABS: 1.0000 | ORAL_TABLET | ORAL | 0 refills | Status: DC | PRN

## 2017-08-06 MED ORDER — ACETAMINOPHEN 325 MG PO TABS: 650.0000 mg | ORAL_TABLET | Freq: Four times a day (QID) | ORAL | Status: DC | PRN

## 2017-08-06 MED ORDER — POLYETHYLENE GLYCOL 3350 17 GM/SCOOP PO POWD: 17.0000 g | Freq: Every day | ORAL | 99 refills | Status: AC

## 2017-08-06 NOTE — Discharge Summary (Signed)
Physician Discharge Summary  Patient ID: Ashlee Stein MRN: [id number] DOB/AGE: 52/29/1967 52 y.o.  Admit date: 08/04/2017 Discharge date: 08/06/2017  Admission Diagnoses:  Discharge Diagnoses:  Active Problems:   S/P abdominal supracervical subtotal hysterectomy s/p ventral hernia repair  Discharged Condition: good  Hospital Course: This 52 year old female was admitted through day surgery for abdominal supracervical hysterectomy.  Additionally repair the ventral hernia that was identified Intra-Op, with a 200 cc blood loss.  She had a stable postoperative course of 2 days, tolerating regular diet, being adequately controlled with Percocet for pain, with Lovenox given at 24 hours and 48 hours as prophylaxis.  She remained afebrile with normal pressures, was stable for discharge at 48 hours.  JP drain was draining 25 cc/day.  This will be left in until postop visit on postop day 6  Consults: None  Significant Diagnostic Studies: labs:  CBC Latest Ref Rng & Units 08/05/2017 07/30/2017 09/18/2016  WBC 4.0 - 10.5 K/uL 8.0 3.8(L) 3.7(L)  Hemoglobin 12.0 - 15.0 g/dL 10.6(L) 12.4 13.0  Hematocrit 36.0 - 46.0 % 32.1(L) 37.2 37.6  Platelets 150 - 400 K/uL 230 262 264  \ BMP Latest Ref Rng & Units 08/05/2017 07/30/2017 09/18/2016  Glucose 65 - 99 mg/dL 115(H) 125(H) 119(H)  BUN 6 - 20 mg/dL 8 11 11   Creatinine 0.44 - 1.00 mg/dL 0.97 1.00 1.14(H)  Sodium 135 - 145 mmol/L 139 137 137  Potassium 3.5 - 5.1 mmol/L 3.2(L) 3.3(L) 3.5  Chloride 101 - 111 mmol/L 103 104 101  CO2 22 - 32 mmol/L 28 26 24   Calcium 8.9 - 10.3 mg/dL 8.0(L) 8.8(L) 9.1     Treatments: surgery: Abdominal supracervical hysterectomy bilateral salpingectomy, repair of ventral hernia (separate incision)  Discharge Exam: Blood pressure 117/87, pulse 63, temperature 97.8 F (36.6 C), temperature source Oral, resp. rate 18, height 5\' 7"  (1.702 m), weight 281 lb 4.9 oz (127.6 kg), last menstrual period 07/23/2017, SpO2 100 %. General  appearance: alert, cooperative and appears stated age Head: Normocephalic, without obvious abnormality, atraumatic Eyes: conjunctivae/corneas clear. PERRL, EOM's intact. Fundi benign. Resp: clear to auscultation bilaterally GI: normal findings: bowel sounds normal Extremities: extremities normal, atraumatic, no cyanosis or edema Incision/Wound: Pathology report pending at time of discharge Disposition: Discharge disposition: 01-Home or Self Care       Discharge Instructions    Call MD for:  persistant nausea and vomiting   Complete by:  As directed    Call MD for:  redness, tenderness, or signs of infection (pain, swelling, redness, odor or green/yellow discharge around incision site)   Complete by:  As directed    Call MD for:  severe uncontrolled pain   Complete by:  As directed    Call MD for:  temperature >100.4   Complete by:  As directed    Diet - low sodium heart healthy   Complete by:  As directed    Increase activity slowly   Complete by:  As directed    Leave dressing on - Keep it clean, dry, and intact until clinic visit   Complete by:  As directed       Follow-up Information    Jonnie Kind, MD Follow up in 4 day(s).   Specialties:  Obstetrics and Gynecology, Radiology Contact information: [place] [address] [phone]           Signed: Jonnie Kind 08/06/2017, 10:18 AM

## 2017-08-06 NOTE — Progress Notes (Signed)
Pt had loss of IV access during admission. Pt's IV site clean dry and intact. Discharge instructions including medications and follow up appointments were reviewed and discussed with patient. All questions were answered and no further questions at this time. Pt in stable condition and in no acute distress at time of discharge. Pt escorted by RN.

## 2017-08-06 NOTE — Discharge Instructions (Signed)
Abdominal Hysterectomy °Abdominal hysterectomy is a surgical procedure to remove the womb (uterus). The uterus is the muscular organ that houses a developing baby. This surgery may be done if: °· You have cancer. °· You have growths (tumors or fibroids) in the uterus. °· You have long-term (chronic) pain. °· You are bleeding. °· Your uterus has slipped down into your vagina (uterine prolapse). °· You have a condition in which the tissue that lines the uterus grows outside of its normal location (endometriosis). °· You have an infection in your uterus. °· You are having problems with your menstrual cycle. ° °Depending on why you are having this procedure, you may also have other reproductive organs removed. These could include: °· The part of your vagina that connects with your uterus (cervix). °· The organs that make eggs (ovaries). °· The tubes that connect the ovaries to the uterus (fallopian tubes). ° °Tell a health care provider about: °· Any allergies you have. °· All medicines you are taking, including vitamins, herbs, eye drops, creams, and over-the-counter medicines. °· Any problems you or family members have had with anesthetic medicines. °· Any blood disorders you have. °· Any surgeries you have had. °· Any medical conditions you have. °· Whether you are pregnant or may be pregnant. °What are the risks? °Generally, this is a safe procedure. However, problems may occur, including: °· Bleeding. °· Infection. °· Allergic reactions to medicines or dyes. °· Damage to other structures or organs. °· Nerve injury. °· Decreased interest in sex or pain during sex. °· Blood clots that can break free and travel to your lungs. ° °What happens before the procedure? °Staying hydrated °Follow instructions from your health care provider about hydration, which may include: °· Up to 2 hours before the procedure - you may continue to drink clear liquids, such as water, clear fruit juice, black coffee, and plain tea ° °Eating  and drinking restrictions °Follow instructions from your health care provider about eating and drinking, which may include: °· 8 hours before the procedure - stop eating heavy meals or foods such as meat, fried foods, or fatty foods. °· 6 hours before the procedure - stop eating light meals or foods, such as toast or cereal. °· 6 hours before the procedure - stop drinking milk or drinks that contain milk. °· 2 hours before the procedure - stop drinking clear liquids. ° °Medicines °· Ask your health care provider about: °? Changing or stopping your regular medicines. This is especially important if you are taking diabetes medicines or blood thinners. °? Taking medicines such as aspirin and ibuprofen. These medicines can thin your blood. Do not take these medicines before your procedure if your health care provider instructs you not to. °· You may be given antibiotic medicine to help prevent infection. Take it as told by your health care provider. °· You may be asked to take laxatives to prevent constipation. °General instructions °· Ask your health care provider how your surgical site will be marked or identified. °· You may be asked to shower with a germ-killing soap. °· Plan to have someone take you home from the hospital. °· Do not use any products that contain nicotine or tobacco, such as cigarettes and e-cigarettes. If you need help quitting, ask your health care provider. °· You may have an exam or testing. °· You may have a blood or urine sample taken. °· You may need to have an enema to clean out your rectum and lower colon. °· This   procedure can affect the way you feel about yourself. Talk to your health care provider about the physical and emotional changes this procedure may cause. °What happens during the procedure? °· To lower your risk of infection: °? Your health care team will wash or sanitize their hands. °? Your skin will be washed with soap. °? Hair may be removed from the surgical area. °· An IV  tube will be inserted into one of your veins. °· You will be given one or more of the following: °? A medicine to help you relax (sedative). °? A medicine to make you fall asleep (general anesthetic). °· Tight-fitting (compression) stockings will be placed on your legs to promote circulation. °· A thin, flexible tube (catheter) will be inserted to help drain your urine. °· The surgeon will make a cut (incision) through the skin in your lower belly. The incision may go side-to-side or up-and-down. °· The surgeon will move aside the body tissue that covers your uterus. The surgeon will then carefully take out your uterus along with any of the other organs that need to be removed. °· Bleeding will be controlled with clamps or sutures. °· The surgeon will close your incision with stitches (sutures), skin glue, or adhesive strips. °· A bandage (dressing) will be placed over the incision. °The procedure may vary among health care providers and hospitals. °What happens after the procedure? °· You will be given pain medicine as needed. °· Your blood pressure, heart rate, breathing rate, and blood oxygen level will be monitored until the medicines you were given have worn off. °· You will need to stay in the hospital to recover for one to two days. Ask your health care provider how long you will need to stay in the hospital after your procedure. °· You may have a liquid diet at first. You will most likely return to your usual diet the day after surgery. °· You will still have the urinary catheter in place. It will likely be removed the day after surgery. °· You may have to wear compression stockings. These stockings help to prevent blood clots and reduce swelling in your legs. °· You will be encouraged to walk as soon as possible. You will also use a device or do breathing exercises to keep your lungs clear. °· You may need to use a sanitary napkin for vaginal discharge. °Summary °· Abdominal hysterectomy is a surgical  procedure to remove the womb (uterus). The uterus is the muscular organ that houses a developing baby. °· This procedure can affect the way you feel about yourself. Talk to your health care provider about the physical and emotional changes this procedure may cause. °· You will be given medicines for pain after the procedure. °· You will need to stay in the hospital to recover. Ask your health care provider how long you will need to stay in the hospital after your procedure. °This information is not intended to replace advice given to you by your health care provider. Make sure you discuss any questions you have with your health care provider. °Document Released: 02/15/2013 Document Revised: 01/30/2016 Document Reviewed: 01/30/2016 °Elsevier Interactive Patient Education © 2017 [employer] ° °

## 2017-08-12 ENCOUNTER — Ambulatory Visit (INDEPENDENT_AMBULATORY_CARE_PROVIDER_SITE_OTHER): Payer: BLUE CROSS/BLUE SHIELD | Admitting: Obstetrics and Gynecology

## 2017-08-12 ENCOUNTER — Encounter: Payer: Self-pay | Admitting: Obstetrics and Gynecology

## 2017-08-12 VITALS — BP 105/70 | HR 69 | Ht 67.5 in | Wt 261.5 lb

## 2017-08-12 DIAGNOSIS — Z09 Encounter for follow-up examination after completed treatment for conditions other than malignant neoplasm: Secondary | ICD-10-CM

## 2017-08-12 DIAGNOSIS — Z9889 Other specified postprocedural states: Secondary | ICD-10-CM

## 2017-08-12 DIAGNOSIS — Z029 Encounter for administrative examinations, unspecified: Secondary | ICD-10-CM

## 2017-08-12 NOTE — Progress Notes (Signed)
Patient ID: Ashlee Stein, female   DOB: 04-Aug-1965, 52 y.o.   MRN: [id number]    Subjective:  Ashlee Stein is a 52 y.o. female now 1 weeks status post HYSTERECTOMY SUPRACERVICAL ABDOMINAL, BILATERAL SALPINGECTOMY and HERNIA REPAIR VENTRAL ADULT.   She endorses pain around the entrance to the surgical site, but adds it is less than before. Her pain is mostly in her lower abdomen and it is present when she is walking or sitting. She still has her drain in place. She reports walking twice a day, everyday and has been trying to lose weight.  She drinks water before and after walking. Yesterday she walked over 8000 steps. She denies fever, chills, or any other symptoms or complaints at this time.   Review of Systems Negative except pain   Diet:   normal   Bowel movements : normal.  She continues to have pain  Objective:  BP 105/70 (BP Location: Left Arm, Patient Position: Sitting, Cuff Size: Large)    Pulse 69    Ht 5' 7.5" (1.715 m)    Wt 261 lb 8 oz (118.6 kg)    LMP 07/23/2017 (Approximate)    BMI 40.35 kg/m  General:Well developed, well nourished.  No acute distress. Abdomen: Bowel sounds normal, soft, non-tender. Pelvic Exam: DEFERRED  Incision(s):   Healing well, no drainage, no erythema, no hernia, no swelling, no dehiscence, drain removed, superior to cut: mild line hardness drain removed with minimal fluid and minimal discomfort Dressings applied  Assessment:  Post-Op 1 weeks s/p HYSTERECTOMY SUPRACERVICAL ABDOMINAL, BILATERAL SALPINGECTOMY and HERNIA REPAIR VENTRAL ADULT   Doing well postoperatively.   Plan:  1.Wound care discussed   2. current medications.  NSAIDs and has discontinued Percocet 3. Activity restrictions: no bending, stooping, or squatting, no driving, no gym class and no overhead lifting 4. return to work: 4 weeks.  Likely 6 weeks postop 5. Follow up in 4 weeks.  May need an additional week before returning to work at 6 weeks  By signing my name below, I,  Margit Banda, attest that this documentation has been prepared under the direction and in the presence of Jonnie Kind, MD. Electronically Signed: Margit Banda, Medical Scribe. 08/12/17. 10:44 AM.  I personally performed the services described in this documentation, which was SCRIBED in my presence. The recorded information has been reviewed and considered accurate. It has been edited as necessary during review. Jonnie Kind, MD

## 2017-08-19 ENCOUNTER — Other Ambulatory Visit (HOSPITAL_COMMUNITY): Payer: Self-pay | Admitting: Internal Medicine

## 2017-08-19 DIAGNOSIS — Z1231 Encounter for screening mammogram for malignant neoplasm of breast: Secondary | ICD-10-CM

## 2017-08-28 ENCOUNTER — Other Ambulatory Visit: Payer: Self-pay | Admitting: Cardiovascular Disease

## 2017-08-31 ENCOUNTER — Telehealth: Payer: Self-pay | Admitting: *Deleted

## 2017-08-31 ENCOUNTER — Ambulatory Visit (INDEPENDENT_AMBULATORY_CARE_PROVIDER_SITE_OTHER): Payer: BLUE CROSS/BLUE SHIELD | Admitting: Obstetrics and Gynecology

## 2017-08-31 ENCOUNTER — Encounter: Payer: Self-pay | Admitting: Obstetrics and Gynecology

## 2017-08-31 VITALS — BP 104/62 | HR 70 | Ht 67.0 in | Wt 262.2 lb

## 2017-08-31 DIAGNOSIS — N99820 Postprocedural hemorrhage and hematoma of a genitourinary system organ or structure following a genitourinary system procedure: Secondary | ICD-10-CM

## 2017-08-31 NOTE — Telephone Encounter (Signed)
Patient states she is continuing to have vaginal bleeding and is concerned. Today she woke up with blood "coming out like urine".  Advised patient we could w/i today with Dr Glo Herring for evaluation. Verbalized understanding.

## 2017-08-31 NOTE — Progress Notes (Signed)
Patient ID: Ashlee Stein, female   DOB: 15-Oct-1965, 52 y.o.   MRN: [id number]    [place] [facility] Visit  @DATE @            Patient name: Ashlee Stein MRN [id number]  Date of birth: [birth year]  CC & HPI: Work in appointment for vaginal bleeding postop  Ashlee Stein is a 52 y.o. female presenting today for vaginal bleeding. Recently she had a abdominal supracervical hysterectomy, with repair of epigastric hernia. On saturday Ashlee Stein saw discharge,and had pain in lower abdomen but no fever. She noticed more discharge on Sunday before going to church.  ROS:  ROS +vaginal bleeding x last 2 days patient thinks she may have moved more vigorously and the bleeding increased +lower abdomen tenderness -fever -chills  All systems are negative except as noted in the HPI and PMH.  Pertinent History Reviewed:   Reviewed: Significant for Hysterectomy, BLT salpingectomy, Hernia repair, ventral Medical         Past Medical History:  Diagnosis Date  . Angina   . Asthma    "as a child"  . Coronary artery dissection    NSTEMI 8/12: cath with mid to dist LAD spont. dissection, unable to do PCI and referred for CABG:  SVG to LAD (Dr. PVT);  Carotid U/S 6/14:  Normal carotid arteries; FMD could not be ruled out  . Coronary artery dissection 10/24/2010  . Dyslipidemia   . GERD (gastroesophageal reflux disease)   . Heart disease   . Hypertension   . Iron deficiency anemia   . Obesity                               Surgical Hx:    Past Surgical History:  Procedure Laterality Date  . BILATERAL SALPINGECTOMY Bilateral 08/04/2017   Procedure: BILATERAL SALPINGECTOMY;  Surgeon: Jonnie Kind, MD;  Location: [facility];  Service: Gynecology;  Laterality: Bilateral;  . CARDIAC SURGERY     open heart surgery  . CERVICAL POLYPECTOMY  07/01/2011   Procedure: CERVICAL POLYPECTOMY;  Surgeon: Jonnie Kind, MD;  Location: [facility];  Service: Gynecology;  Laterality: N/A;  Endometrial Polypectomy  . CORONARY ARTERY  BYPASS GRAFT  10/24/2010   CABG X1:  coronary artery dissection -->CABG:  SVG to LAD (Dr. PVT)  . DILATION AND CURETTAGE OF UTERUS    . HYSTEROSCOPY W/ ENDOMETRIAL ABLATION    . LAPAROSCOPIC TUBAL LIGATION  07/01/2011   Procedure: LAPAROSCOPIC TUBAL LIGATION;  Surgeon: Jonnie Kind, MD;  Location: [facility];  Service: Gynecology;  Laterality: Bilateral;  Laparoscopic Bilateral Tubal Sterilization with Fallope Rings; ended at 1143  . SUPRACERVICAL ABDOMINAL HYSTERECTOMY N/A 08/04/2017   Procedure: HYSTERECTOMY SUPRACERVICAL ABDOMINAL;  Surgeon: Jonnie Kind, MD;  Location: [facility];  Service: Gynecology;  Laterality: N/A;  . TUBAL LIGATION    . UMBILICAL HERNIA REPAIR  07/01/2011   Procedure: HERNIA REPAIR UMBILICAL ADULT;  Surgeon: Jonnie Kind, MD;  Location: [facility];  Service: Gynecology;  Laterality: N/A;  during Laparoscopic Bilateral Tubal Sterilization  . VENTRAL HERNIA REPAIR N/A 08/04/2017   Procedure: HERNIA REPAIR VENTRAL ADULT;  Surgeon: Jonnie Kind, MD;  Location: [facility];  Service: Gynecology;  Laterality: N/A;   Medications: Reviewed & Updated - see associated section                       Current Outpatient Medications:  .  aspirin EC 81 MG tablet, Take 1 tablet (81 mg total) by mouth daily., Disp: , Rfl:  .  atorvastatin (LIPITOR) 20 MG tablet, TAKE 1 TABLET DAILY, Disp: 90 tablet, Rfl: 3 .  latanoprost (XALATAN) 0.005 % ophthalmic solution, Place 1 drop into both eyes at bedtime., Disp: , Rfl: 3 .  lisinopril-hydrochlorothiazide (PRINZIDE,ZESTORETIC) 20-12.5 MG tablet, Take 1 tablet by mouth 2 (two) times daily. , Disp: , Rfl: 3 .  metoprolol tartrate (LOPRESSOR) 25 MG tablet, TAKE 1 & 1/2 TABLETS BY MOUTH TWICE A DAY (Patient taking differently: Take 37.5 mg by mouth 2 (two) times daily. TAKE 1 & 1/2 TABLETS BY MOUTH TWICE A DAY), Disp: 270 tablet, Rfl: 3 .  Multiple Vitamin (MULITIVITAMIN WITH MINERALS) TABS, Take 1 tablet by mouth daily. , Disp: , Rfl:  .  omeprazole  (PRILOSEC) 40 MG capsule, Take 40 mg by mouth daily as needed. , Disp: , Rfl: 3 .  polyethylene glycol powder (MIRALAX) powder, Take 17 g by mouth daily. To prevent constipation (Patient taking differently: Take 17 g by mouth as needed. To prevent constipation), Disp: 255 g, Rfl: prn   Social History: Reviewed -  reports that she has never smoked. She has never used smokeless tobacco.  Objective Findings:  Vitals: Blood pressure 104/62, pulse 70, height 5\' 7"  (1.702 m), weight 262 lb 3.2 oz (118.9 kg), last menstrual period 07/23/2017.  PHYSICAL EXAMINATION General appearance - alert, well appearing, and in no distress and oriented to person, place, and time Mental status - alert, oriented to person, place, and time, normal mood, behavior, speech, dress, motor activity, and thought processes, affect appropriate to mood Abdomen-abdominal well healed, upper abdominal wall well supported  PELVIC External genitalia -normal Vagina - normal Cervix - present with light menstrual type blood coming from the cervical loss.  Initially was unable to be certain if it was the endocervix so silver nitrate was used to treat the endocervix but it became apparent the bleeding was from above the exocervix Uterus - surgically absent   Assessment & Plan:   A:  1.  Intracervical bleeding post supracervical hysterectomy  P:  1.  Reduce physical activity x 48 hours 2. Keep follow up appointments monitor bleeding and notify us for any significant increase in bleeding    By signing my name below, I, Samul Dada, attest that this documentation has been prepared under the direction and in the presence of Jonnie Kind, MD. Electronically Signed: [place]. 08/31/17. 3:44 PM.  I personally performed the services described in this documentation, which was SCRIBED in my presence. The recorded information has been reviewed and considered accurate. It has been edited as necessary during  review. Jonnie Kind, MD

## 2017-09-09 ENCOUNTER — Other Ambulatory Visit: Payer: Self-pay

## 2017-09-09 ENCOUNTER — Ambulatory Visit (HOSPITAL_COMMUNITY)
Admission: RE | Admit: 2017-09-09 | Discharge: 2017-09-09 | Disposition: A | Discharge status: Home / Self Care | Payer: BLUE CROSS/BLUE SHIELD | Source: Ambulatory Visit | Attending: Internal Medicine | Admitting: Internal Medicine

## 2017-09-09 ENCOUNTER — Ambulatory Visit (INDEPENDENT_AMBULATORY_CARE_PROVIDER_SITE_OTHER): Payer: BLUE CROSS/BLUE SHIELD | Admitting: Obstetrics and Gynecology

## 2017-09-09 ENCOUNTER — Encounter: Payer: Self-pay | Admitting: Obstetrics and Gynecology

## 2017-09-09 VITALS — BP 121/73 | HR 65 | Ht 67.5 in | Wt 263.0 lb

## 2017-09-09 DIAGNOSIS — Z1231 Encounter for screening mammogram for malignant neoplasm of breast: Secondary | ICD-10-CM | POA: Insufficient documentation

## 2017-09-09 DIAGNOSIS — Z9889 Other specified postprocedural states: Secondary | ICD-10-CM

## 2017-09-09 DIAGNOSIS — Z09 Encounter for follow-up examination after completed treatment for conditions other than malignant neoplasm: Secondary | ICD-10-CM

## 2017-09-09 NOTE — Progress Notes (Signed)
Patient ID: Sallie Staron, female   DOB: 04/20/1965, 52 y.o.   MRN: [id number]  Subjective:  Tela Kotecki is a 52 y.o. female now 5 weeks status post hysterectomy supracervical abdominal, bilateral salpingectomy, hernia repair ventral adult.    There was small bleeding but stopped this past week, and there is tenderness in abdomen.  Review of Systems Negative except    Diet:   normal   Bowel movements : normal.  Tenderness in abdomen but not taking any pain medication  Objective:  BP 121/73 (BP Location: Right Arm, Patient Position: Sitting, Cuff Size: Large)   Pulse 65   Ht 5' 7.5" (1.715 m)   Wt 263 lb (119.3 kg)   LMP 07/23/2017 (Approximate)   BMI 40.58 kg/m  General:Well developed, well nourished.  No acute distress. Abdomen: Bowel sounds normal, soft, non-tender. Pelvic Exam:  DEFFERED  Incision(s):   Healing well, no drainage, no erythema, no hernia, no swelling, no dehiscence   Assessment:  Post-Op 5 weeks s/p hysterectomy supracervical abdominal, bilateral salpingectomy, hernia repair ventral adult   Doing well postoperatively.   Plan:  3. Activity restrictions: no lifting more than 15 pounds 4. return to work: 2 weeks. 5. Follow up in 1 year .  By signing my name below, I, Samul Dada, attest that this documentation has been prepared under the direction and in the presence of Jonnie Kind, MD. Electronically Signed: [place]. 09/09/17. 10:12 AM.  I personally performed the services described in this documentation, which was SCRIBED in my presence. The recorded information has been reviewed and considered accurate. It has been edited as necessary during review. Jonnie Kind, MD

## 2017-09-10 DIAGNOSIS — H401131 Primary open-angle glaucoma, bilateral, mild stage: Secondary | ICD-10-CM | POA: Diagnosis not present

## 2017-09-16 ENCOUNTER — Ambulatory Visit (HOSPITAL_COMMUNITY): Payer: BLUE CROSS/BLUE SHIELD

## 2017-09-23 ENCOUNTER — Ambulatory Visit: Payer: BLUE CROSS/BLUE SHIELD | Admitting: Cardiovascular Disease

## 2017-09-23 DIAGNOSIS — R0989 Other specified symptoms and signs involving the circulatory and respiratory systems: Secondary | ICD-10-CM

## 2017-09-24 ENCOUNTER — Encounter: Payer: Self-pay | Admitting: Cardiovascular Disease

## 2017-10-21 ENCOUNTER — Telehealth: Payer: Self-pay | Admitting: Obstetrics and Gynecology

## 2017-10-21 NOTE — Telephone Encounter (Signed)
Pt called back at 4:41 advised pt that I would give Ashlee Stein message to call her back Ashlee Stein Ashlee Stein

## 2017-10-21 NOTE — Telephone Encounter (Signed)
Left message @ 12:50 pm. JSY

## 2017-10-22 NOTE — Telephone Encounter (Signed)
Spoke with pt. Pt having some vaginal discharge. Started last week. Has slight odor. Advised she would need to be seen. Pt advised to call back after 1:30 to schedule an appt. Pt voiced understanding. [place]

## 2017-10-23 ENCOUNTER — Ambulatory Visit (INDEPENDENT_AMBULATORY_CARE_PROVIDER_SITE_OTHER): Payer: BLUE CROSS/BLUE SHIELD | Admitting: Obstetrics and Gynecology

## 2017-10-23 ENCOUNTER — Encounter: Payer: Self-pay | Admitting: Obstetrics and Gynecology

## 2017-10-23 VITALS — BP 112/75 | HR 63 | Ht 67.0 in | Wt 263.8 lb

## 2017-10-23 DIAGNOSIS — N888 Other specified noninflammatory disorders of cervix uteri: Secondary | ICD-10-CM

## 2017-10-23 NOTE — Progress Notes (Signed)
Patient ID: Ashlee Stein, female   DOB: Jul 15, 1965, 52 y.o.   MRN: [id number]    [facility] Visit  @DATE @            Patient name: Ashlee Stein MRN [id number]  Date of birth: 1965-07-29  CC & HPI:  Ashlee Stein is a 52 y.o. female presenting today for dry bloody vaginal discharge. She has not resumed sexual activity but would like all clear from Dr. Glo Herring  ROS:  ROS +dry vaginal drainage -fever -chills All systems are negative except as noted in the HPI and PMH.    Pertinent History Reviewed:   Reviewed: Significant for vaginal hystrectomy Medical         Past Medical History:  Diagnosis Date  . Angina   . Asthma    "as a child"  . Coronary artery dissection    NSTEMI 8/12: cath with mid to dist LAD spont. dissection, unable to do PCI and referred for CABG:  SVG to LAD (Dr. PVT);  Carotid U/S 6/14:  Normal carotid arteries; FMD could not be ruled out  . Coronary artery dissection 10/24/2010  . Dyslipidemia   . GERD (gastroesophageal reflux disease)   . Heart disease   . Hypertension   . Iron deficiency anemia   . Obesity                               Surgical Hx:    Past Surgical History:  Procedure Laterality Date  . BILATERAL SALPINGECTOMY Bilateral 08/04/2017   Procedure: BILATERAL SALPINGECTOMY;  Surgeon: Jonnie Kind, MD;  Location: [facility];  Service: Gynecology;  Laterality: Bilateral;  . CARDIAC SURGERY     open heart surgery  . CERVICAL POLYPECTOMY  07/01/2011   Procedure: CERVICAL POLYPECTOMY;  Surgeon: Jonnie Kind, MD;  Location: [facility];  Service: Gynecology;  Laterality: N/A;  Endometrial Polypectomy  . CORONARY ARTERY BYPASS GRAFT  10/24/2010   CABG X1:  coronary artery dissection -->CABG:  SVG to LAD (Dr. PVT)  . DILATION AND CURETTAGE OF UTERUS    . HYSTEROSCOPY W/ ENDOMETRIAL ABLATION    . LAPAROSCOPIC TUBAL LIGATION  07/01/2011   Procedure: LAPAROSCOPIC TUBAL LIGATION;  Surgeon: Jonnie Kind, MD;  Location: [facility];  Service: Gynecology;   Laterality: Bilateral;  Laparoscopic Bilateral Tubal Sterilization with Fallope Rings; ended at 1143  . SUPRACERVICAL ABDOMINAL HYSTERECTOMY N/A 08/04/2017   Procedure: HYSTERECTOMY SUPRACERVICAL ABDOMINAL;  Surgeon: Jonnie Kind, MD;  Location: [facility];  Service: Gynecology;  Laterality: N/A;  . TUBAL LIGATION    . UMBILICAL HERNIA REPAIR  07/01/2011   Procedure: HERNIA REPAIR UMBILICAL ADULT;  Surgeon: Jonnie Kind, MD;  Location: [facility];  Service: Gynecology;  Laterality: N/A;  during Laparoscopic Bilateral Tubal Sterilization  . VENTRAL HERNIA REPAIR N/A 08/04/2017   Procedure: HERNIA REPAIR VENTRAL ADULT;  Surgeon: Jonnie Kind, MD;  Location: [facility];  Service: Gynecology;  Laterality: N/A;   Medications: Reviewed & Updated - see associated section                       Current Outpatient Medications:  .  aspirin EC 81 MG tablet, Take 1 tablet (81 mg total) by mouth daily., Disp: , Rfl:  .  atorvastatin (LIPITOR) 20 MG tablet, TAKE 1 TABLET DAILY, Disp: 90 tablet, Rfl: 3 .  latanoprost (XALATAN) 0.005 % ophthalmic solution, Place 1 drop into both  eyes at bedtime., Disp: , Rfl: 3 .  lisinopril-hydrochlorothiazide (PRINZIDE,ZESTORETIC) 20-12.5 MG tablet, Take 1 tablet by mouth 2 (two) times daily. , Disp: , Rfl: 3 .  metoprolol tartrate (LOPRESSOR) 25 MG tablet, TAKE 1 & 1/2 TABLETS BY MOUTH TWICE A DAY (Patient taking differently: Take 37.5 mg by mouth 2 (two) times daily. TAKE 1 & 1/2 TABLETS BY MOUTH TWICE A DAY), Disp: 270 tablet, Rfl: 3 .  Multiple Vitamin (MULITIVITAMIN WITH MINERALS) TABS, Take 1 tablet by mouth daily. , Disp: , Rfl:  .  omeprazole (PRILOSEC) 40 MG capsule, Take 40 mg by mouth daily as needed. , Disp: , Rfl: 3 .  polyethylene glycol powder (MIRALAX) powder, Take 17 g by mouth daily. To prevent constipation (Patient taking differently: Take 17 g by mouth as needed. To prevent constipation), Disp: 255 g, Rfl: prn   Social History: Reviewed -  reports that she has  never smoked. She has never used smokeless tobacco.  Objective Findings:  Vitals: Blood pressure 112/75, pulse 63, height 5\' 7"  (1.702 m), weight 263 lb 12.8 oz (119.7 kg), last menstrual period 07/23/2017.  PHYSICAL EXAMINATION General appearance - alert, well appearing, and in no distress and oriented to person, place, and time Mental status - alert, oriented to person, place, and time, normal mood, behavior, speech, dress, motor activity, and thought processes, affect appropriate to mood   PELVIC External genitalia - normal appearing Vagina - normal vaginal secretions Cervix - present, normal,mobile, non-tender, flexible Uterus - surgically absent   Assessment & Plan:   A:  1.  Menstrual bleeding from cervical stump, minimal 2. Excellent healing after supracervical hysterectomy  P:  1.  PRN    By signing my name below, I, Samul Dada, attest that this documentation has been prepared under the direction and in the presence of Jonnie Kind, MD. Electronically Signed: [place]. 10/23/17. 11:16 AM.  I personally performed the services described in this documentation, which was SCRIBED in my presence. The recorded information has been reviewed and considered accurate. It has been edited as necessary during review. Jonnie Kind, MD

## 2018-01-24 ENCOUNTER — Other Ambulatory Visit: Payer: Self-pay | Admitting: Obstetrics and Gynecology

## 2018-03-11 DIAGNOSIS — H401131 Primary open-angle glaucoma, bilateral, mild stage: Secondary | ICD-10-CM | POA: Diagnosis not present

## 2018-03-11 DIAGNOSIS — H25013 Cortical age-related cataract, bilateral: Secondary | ICD-10-CM | POA: Diagnosis not present

## 2018-03-11 DIAGNOSIS — H52221 Regular astigmatism, right eye: Secondary | ICD-10-CM | POA: Diagnosis not present

## 2018-03-11 DIAGNOSIS — H5203 Hypermetropia, bilateral: Secondary | ICD-10-CM | POA: Diagnosis not present

## 2018-04-01 DIAGNOSIS — I2510 Atherosclerotic heart disease of native coronary artery without angina pectoris: Secondary | ICD-10-CM | POA: Diagnosis not present

## 2018-04-01 DIAGNOSIS — J069 Acute upper respiratory infection, unspecified: Secondary | ICD-10-CM | POA: Diagnosis not present

## 2018-04-01 DIAGNOSIS — I10 Essential (primary) hypertension: Secondary | ICD-10-CM | POA: Diagnosis not present

## 2018-04-02 ENCOUNTER — Encounter: Payer: Self-pay | Admitting: Cardiovascular Disease

## 2018-04-02 ENCOUNTER — Ambulatory Visit (INDEPENDENT_AMBULATORY_CARE_PROVIDER_SITE_OTHER): Payer: BLUE CROSS/BLUE SHIELD | Admitting: Cardiovascular Disease

## 2018-04-02 VITALS — BP 148/92 | HR 77 | Ht 67.0 in | Wt 261.0 lb

## 2018-04-02 DIAGNOSIS — I10 Essential (primary) hypertension: Secondary | ICD-10-CM

## 2018-04-02 DIAGNOSIS — E785 Hyperlipidemia, unspecified: Secondary | ICD-10-CM

## 2018-04-02 DIAGNOSIS — I25708 Atherosclerosis of coronary artery bypass graft(s), unspecified, with other forms of angina pectoris: Secondary | ICD-10-CM | POA: Diagnosis not present

## 2018-04-02 NOTE — Patient Instructions (Signed)
Medication Instructions:  Your physician recommends that you continue on your current medications as directed. Please refer to the Current Medication list given to you today.  If you need a refill on your cardiac medications before your next appointment, please call your pharmacy.   Lab work: None today If you have labs (blood work) drawn today and your tests are completely normal, you will receive your results only by: Marland Kitchen MyChart Message (if you have MyChart) OR . A paper copy in the mail If you have any lab test that is abnormal or we need to change your treatment, we will call you to review the results.  Testing/Procedures: None today  Follow-Up: At [facility], you and your health needs are our priority.  As part of our continuing mission to provide you with exceptional heart care, we have created designated Provider Care Teams.  These Care Teams include your primary Cardiologist (physician) and Advanced Practice Providers (APPs -  Physician Assistants and Nurse Practitioners) who all work together to provide you with the care you need, when you need it. You will need a follow up appointment in 12 months.  Please call our office 2 months in advance to schedule this appointment.  You may see Kate Sable, MD or one of the following Advanced Practice Providers on your designated Care Team:   Bernerd Pho, PA-C [facility]) . Ermalinda Barrios, PA-C ([facility])  Any Other Special Instructions Will Be Listed Below (If Applicable). None

## 2018-04-02 NOTE — Progress Notes (Signed)
SUBJECTIVE:  The patient returns for routine follow-up. She has a history of coronary artery disease and underwent 1-vessel CABG (SVG to LAD) in August 2012.  She also has a history of hypertension.  Most recent echocardiogram I find is dated 11/21/10 and showed normal left ventricular systolic function and regional wall motion, LVEF 60-65%, mild mitral regurgitation, mild biatrial dilatation, mild right ventricular dilatation, and moderate tricuspid regurgitation, PA pressure 34 mmHg.  Nuclear stress test was normal on 01/09/2017, EF 69%.  She denies chest pain, leg swelling, and palpitations.  She has had an upper respiratory tract infection for the past week and her PCP prescribed antibiotics yesterday.  She denies fevers.  She checks her blood pressure at home daily and sometimes twice daily and it is normally in the 120/80 range or lower.    Soc Hx: Married. Works doing Sales executive for [employer]. Lives in [place]. Has 2 children.  Her husband works for [employer].  She has a 26 year old daughter (2020) who is doing her masters in Environmental health practitioner at [employer].  She has a 30 year old son (2020) who is a Paramedic in high school in [place] and taking advanced classes.  Review of Systems: As per "subjective", otherwise negative.  Allergies  Allergen Reactions  . Morphine And Related Rash  . Penicillins Rash    Has patient had a PCN reaction causing immediate rash, facial/tongue/throat swelling, SOB or lightheadedness with hypotension: yes Has patient had a PCN reaction causing severe rash involving mucus membranes or skin necrosis: no Has patient had a PCN reaction that required hospitalization: no Has patient had a PCN reaction occurring within the last 10 years: no If all of the above answers are "NO", then may proceed with Cephalosporin use.     Current Outpatient Medications  Medication Sig Dispense Refill  . aspirin EC 81 MG tablet  Take 1 tablet (81 mg total) by mouth daily.    Marland Kitchen atorvastatin (LIPITOR) 20 MG tablet TAKE 1 TABLET DAILY 90 tablet 3  . latanoprost (XALATAN) 0.005 % ophthalmic solution Place 1 drop into both eyes at bedtime.  3  . lisinopril-hydrochlorothiazide (PRINZIDE,ZESTORETIC) 20-12.5 MG tablet Take 1 tablet by mouth 2 (two) times daily.   3  . metoprolol tartrate (LOPRESSOR) 25 MG tablet TAKE 1 & 1/2 TABLETS BY MOUTH TWICE A DAY (Patient taking differently: Take 37.5 mg by mouth 2 (two) times daily. TAKE 1 & 1/2 TABLETS BY MOUTH TWICE A DAY) 270 tablet 3  . Multiple Vitamin (MULITIVITAMIN WITH MINERALS) TABS Take 1 tablet by mouth daily.     Marland Kitchen omeprazole (PRILOSEC) 40 MG capsule Take 40 mg by mouth daily as needed.   3  . polyethylene glycol powder (MIRALAX) powder Take 17 g by mouth daily. To prevent constipation (Patient taking differently: Take 17 g by mouth as needed. To prevent constipation) 255 g prn   No current facility-administered medications for this visit.     Past Medical History:  Diagnosis Date  . Angina   . Asthma    "as a child"  . Coronary artery dissection    NSTEMI 8/12: cath with mid to dist LAD spont. dissection, unable to do PCI and referred for CABG:  SVG to LAD (Dr. PVT);  Carotid U/S 6/14:  Normal carotid arteries; FMD could not be ruled out  . Coronary artery dissection 10/24/2010  . Dyslipidemia   . GERD (gastroesophageal reflux disease)   . Heart disease   . Hypertension   .  Iron deficiency anemia   . Obesity     Past Surgical History:  Procedure Laterality Date  . BILATERAL SALPINGECTOMY Bilateral 08/04/2017   Procedure: BILATERAL SALPINGECTOMY;  Surgeon: Jonnie Kind, MD;  Location: [facility];  Service: Gynecology;  Laterality: Bilateral;  . CARDIAC SURGERY     open heart surgery  . CERVICAL POLYPECTOMY  07/01/2011   Procedure: CERVICAL POLYPECTOMY;  Surgeon: Jonnie Kind, MD;  Location: [facility];  Service: Gynecology;  Laterality: N/A;  Endometrial Polypectomy   . CORONARY ARTERY BYPASS GRAFT  10/24/2010   CABG X1:  coronary artery dissection -->CABG:  SVG to LAD (Dr. PVT)  . DILATION AND CURETTAGE OF UTERUS    . HYSTEROSCOPY W/ ENDOMETRIAL ABLATION    . LAPAROSCOPIC TUBAL LIGATION  07/01/2011   Procedure: LAPAROSCOPIC TUBAL LIGATION;  Surgeon: Jonnie Kind, MD;  Location: [facility];  Service: Gynecology;  Laterality: Bilateral;  Laparoscopic Bilateral Tubal Sterilization with Fallope Rings; ended at 1143  . SUPRACERVICAL ABDOMINAL HYSTERECTOMY N/A 08/04/2017   Procedure: HYSTERECTOMY SUPRACERVICAL ABDOMINAL;  Surgeon: Jonnie Kind, MD;  Location: [facility];  Service: Gynecology;  Laterality: N/A;  . TUBAL LIGATION    . UMBILICAL HERNIA REPAIR  07/01/2011   Procedure: HERNIA REPAIR UMBILICAL ADULT;  Surgeon: Jonnie Kind, MD;  Location: [facility];  Service: Gynecology;  Laterality: N/A;  during Laparoscopic Bilateral Tubal Sterilization  . VENTRAL HERNIA REPAIR N/A 08/04/2017   Procedure: HERNIA REPAIR VENTRAL ADULT;  Surgeon: Jonnie Kind, MD;  Location: [facility];  Service: Gynecology;  Laterality: N/A;    Social History   Socioeconomic History  . Marital status: Married    Spouse name: Not on file  . Number of children: Not on file  . Years of education: Not on file  . Highest education level: Not on file  Occupational History  . Not on file  Social Needs  . Financial resource strain: Not on file  . Food insecurity:    Worry: Not on file    Inability: Not on file  . Transportation needs:    Medical: Not on file    Non-medical: Not on file  Tobacco Use  . Smoking status: Never Smoker  . Smokeless tobacco: Never Used  Substance and Sexual Activity  . Alcohol use: No    Alcohol/week: 0.0 standard drinks  . Drug use: No  . Sexual activity: Not Currently    Birth control/protection: Surgical    Comment: supracervical hyst  Lifestyle  . Physical activity:    Days per week: Not on file    Minutes per session: Not on file  . Stress:  Not on file  Relationships  . Social connections:    Talks on phone: Not on file    Gets together: Not on file    Attends religious service: Not on file    Active member of club or organization: Not on file    Attends meetings of clubs or organizations: Not on file    Relationship status: Not on file  . Intimate partner violence:    Fear of current or ex partner: Not on file    Emotionally abused: Not on file    Physically abused: Not on file    Forced sexual activity: Not on file  Other Topics Concern  . Not on file  Social History Narrative  . Not on file     Vitals:   04/02/18 1528  BP: (!) 148/92  Pulse: 77  SpO2: 92%  Weight:  261 lb (118.4 kg)  Height: 5\' 7"  (1.702 m)    Wt Readings from Last 3 Encounters:  04/02/18 261 lb (118.4 kg)  10/23/17 263 lb 12.8 oz (119.7 kg)  09/09/17 263 lb (119.3 kg)     PHYSICAL EXAM General: NAD HEENT: Normal. Neck: No JVD, no thyromegaly. Lungs: Faint scattered inspiratory wheezes, no crackles. CV: Regular rate and rhythm, normal S1/S2, no S3/S4, no murmur. No pretibial or periankle edema.  No carotid bruit.   Abdomen: Soft, nontender, no distention.  Neurologic: Alert and oriented.  Psych: Normal affect. Skin: Normal. Musculoskeletal: No gross deformities.    ECG: Reviewed above under Subjective   Labs: Lab Results  Component Value Date/Time   K 3.2 (L) 08/05/2017 06:00 AM   BUN 8 08/05/2017 06:00 AM   CREATININE 0.97 08/05/2017 06:00 AM   ALT 23 07/30/2017 11:05 AM   HGB 10.6 (L) 08/05/2017 06:00 AM     Lipids: Lab Results  Component Value Date/Time   LDLCALC 105 (H) 08/04/2012 11:47 AM   LDLDIRECT 125.9 11/01/2012 10:59 AM   CHOL 202 (H) 11/01/2012 10:59 AM   TRIG 165.0 (H) 11/01/2012 10:59 AM   HDL 47.00 11/01/2012 10:59 AM       ASSESSMENT AND PLAN: 1.  Coronary artery disease: History of one-vessel CABG in 2012.  Nuclear stress test was normal in November 2018.  Symptomatically stable.  Continue  aspirin, atorvastatin, and Lopressor.  2.  Hypertension: Blood pressure is elevated.  She is on Prinzide and Lopressor.  She checks it daily and sometimes twice daily at home and it is routinely in the 120/80 range or lower.  No changes to therapy.  3.  Hyperlipidemia: I will obtain a copy of lipids from PCP.  Continue atorvastatin.    Disposition: Follow up 1 year.   Kate Sable, M.D., F.A.C.C.

## 2018-08-05 ENCOUNTER — Other Ambulatory Visit: Payer: Self-pay | Admitting: Cardiovascular Disease

## 2018-09-08 DIAGNOSIS — Z0001 Encounter for general adult medical examination with abnormal findings: Secondary | ICD-10-CM | POA: Diagnosis not present

## 2018-09-08 DIAGNOSIS — I10 Essential (primary) hypertension: Secondary | ICD-10-CM | POA: Diagnosis not present

## 2018-09-08 DIAGNOSIS — I2510 Atherosclerotic heart disease of native coronary artery without angina pectoris: Secondary | ICD-10-CM | POA: Diagnosis not present

## 2018-09-14 ENCOUNTER — Other Ambulatory Visit (HOSPITAL_COMMUNITY): Payer: Self-pay | Admitting: Internal Medicine

## 2018-09-14 ENCOUNTER — Telehealth: Payer: Self-pay | Admitting: *Deleted

## 2018-09-14 ENCOUNTER — Encounter (INDEPENDENT_AMBULATORY_CARE_PROVIDER_SITE_OTHER): Payer: Self-pay | Admitting: *Deleted

## 2018-09-14 DIAGNOSIS — Z1231 Encounter for screening mammogram for malignant neoplasm of breast: Secondary | ICD-10-CM

## 2018-09-14 NOTE — Telephone Encounter (Signed)
Noted  

## 2018-09-14 NOTE — Telephone Encounter (Signed)
I called patient back to let her know we did not receive a referral, but it looks like it was sent to [facility] office instead. Patient said she has never been to [facility] and wants to be established with [facility]. She is going to call her PCP and have them forward referral to [place].

## 2018-09-14 NOTE — Telephone Encounter (Signed)
Pt wanted to schedule an ov with [facility].  Pt says that Dr. Legrand Rams sent over a referral.  [phone]

## 2018-09-23 ENCOUNTER — Ambulatory Visit (HOSPITAL_COMMUNITY): Payer: BLUE CROSS/BLUE SHIELD

## 2018-10-08 ENCOUNTER — Ambulatory Visit (HOSPITAL_COMMUNITY)
Admission: RE | Admit: 2018-10-08 | Discharge: 2018-10-08 | Disposition: A | Discharge status: Home / Self Care | Payer: BC Managed Care – PPO | Source: Ambulatory Visit | Attending: Internal Medicine | Admitting: Internal Medicine

## 2018-10-08 ENCOUNTER — Other Ambulatory Visit: Payer: Self-pay

## 2018-10-08 DIAGNOSIS — Z1231 Encounter for screening mammogram for malignant neoplasm of breast: Secondary | ICD-10-CM | POA: Insufficient documentation

## 2018-10-25 ENCOUNTER — Encounter: Payer: Self-pay | Admitting: *Deleted

## 2018-10-25 ENCOUNTER — Other Ambulatory Visit (INDEPENDENT_AMBULATORY_CARE_PROVIDER_SITE_OTHER): Payer: Self-pay | Admitting: *Deleted

## 2018-10-25 DIAGNOSIS — Z1211 Encounter for screening for malignant neoplasm of colon: Secondary | ICD-10-CM

## 2018-11-23 ENCOUNTER — Ambulatory Visit: Payer: BC Managed Care – PPO | Admitting: Gastroenterology

## 2018-11-24 ENCOUNTER — Ambulatory Visit (INDEPENDENT_AMBULATORY_CARE_PROVIDER_SITE_OTHER): Payer: Self-pay | Admitting: *Deleted

## 2018-11-24 ENCOUNTER — Other Ambulatory Visit: Payer: Self-pay

## 2018-11-24 DIAGNOSIS — Z1211 Encounter for screening for malignant neoplasm of colon: Secondary | ICD-10-CM

## 2018-11-24 MED ORDER — PEG 3350-KCL-NA BICARB-NACL 420 G PO SOLR: 4000.0000 mL | Freq: Once | ORAL | 0 refills | Status: AC

## 2018-11-24 NOTE — Progress Notes (Signed)
Gastroenterology Pre-Procedure Review  Request Date: 11/24/2018 Requesting Physician: Dr. Rosita Fire, no previous TCS  PATIENT REVIEW QUESTIONS: The patient responded to the following health history questions as indicated:    1. Diabetes Melitis: no 2. Joint replacements in the past 12 months: no 3. Major health problems in the past 3 months: no 4. Has an artificial valve or MVP: no 5. Has a defibrillator: no 6. Has been advised in past to take antibiotics in advance of a procedure like teeth cleaning: no 7. Family history of colon cancer: no  8. Alcohol Use: no 9. Illicit drug Use: no 10. History of sleep apnea: no  11. History of coronary artery or other vascular stents placed within the last 12 months: no 12. History of any prior anesthesia complications: no 13. There is no height or weight on file to calculate BMI. ht: 5'7 1/2 wt: 240 lbs    MEDICATIONS & ALLERGIES:    Patient reports the following regarding taking any blood thinners:   Plavix? no Aspirin? yes Coumadin? no Brilinta? no Xarelto? no Eliquis? no Pradaxa? no Savaysa? no Effient? no  Patient confirms/reports the following medications:  Current Outpatient Medications  Medication Sig Dispense Refill  . aspirin EC 81 MG tablet Take 1 tablet (81 mg total) by mouth daily.    Marland Kitchen atorvastatin (LIPITOR) 20 MG tablet TAKE 1 TABLET DAILY 90 tablet 1  . latanoprost (XALATAN) 0.005 % ophthalmic solution Place 1 drop into both eyes at bedtime.  3  . lisinopril-hydrochlorothiazide (PRINZIDE,ZESTORETIC) 20-12.5 MG tablet Take 1 tablet by mouth 2 (two) times daily.   3  . metoprolol tartrate (LOPRESSOR) 25 MG tablet TAKE 1 & 1/2 TABLETS BY MOUTH TWICE A DAY (Patient taking differently: Take 37.5 mg by mouth 2 (two) times daily. TAKE 1 & 1/2 TABLETS BY MOUTH TWICE A DAY) 270 tablet 3  . Multiple Vitamin (MULITIVITAMIN WITH MINERALS) TABS Take 1 tablet by mouth daily.     Marland Kitchen omeprazole (PRILOSEC) 40 MG capsule Take 40 mg by  mouth daily as needed.   3  . polyethylene glycol powder (MIRALAX) powder Take 17 g by mouth daily. To prevent constipation (Patient taking differently: Take 17 g by mouth as needed. To prevent constipation) 255 g prn   No current facility-administered medications for this visit.     Patient confirms/reports the following allergies:  Allergies  Allergen Reactions  . Morphine And Related Rash  . Penicillins Rash    Has patient had a PCN reaction causing immediate rash, facial/tongue/throat swelling, SOB or lightheadedness with hypotension: yes Has patient had a PCN reaction causing severe rash involving mucus membranes or skin necrosis: no Has patient had a PCN reaction that required hospitalization: no Has patient had a PCN reaction occurring within the last 10 years: no If all of the above answers are "NO", then may proceed with Cephalosporin use.     No orders of the defined types were placed in this encounter.   AUTHORIZATION INFORMATION Primary Insurance: [place],  [place] #:[id number] ,  Group #: [id number] Pre-Cert / Josem Kaufmann required: No, file to local BCBS  SCHEDULE INFORMATION: Procedure has been scheduled as follows:  Date: 02/23/2019, Time: 8:30 Location: [facility] with Dr. Oneida Alar  This Gastroenterology Pre-Precedure Review Form is being routed to the following provider(s): Roseanne Kaufman, NP

## 2018-11-24 NOTE — Patient Instructions (Signed)
Florencia Zaccaro   1966-02-21 MRN: [id number]    Procedure Date: 02/23/2019 Time to register: 7:30 am Place to register: [facility] Procedure Time: 8:30 am Scheduled provider: Dr. Oneida Alar  PREPARATION FOR COLONOSCOPY WITH TRI-LYTE SPLIT PREP  Please notify us immediately if you are diabetic, take iron supplements, or if you are on Coumadin or any other blood thinners.    You will need to purchase 1 fleet enema and 1 box of Bisacodyl 33m tablets.   1 DAY BEFORE PROCEDURE:  DATE: 02/22/2019   DAY: Tuesday Continue clear liquids the entire day - NO SOLID FOOD.    At 2:00 pm:  Take 2 Bisacodyl tablets.   At 4:00pm:  Start drinking your solution. Make sure you mix well per instructions on the bottle. Try to drink 1 (one) 8 ounce glass every 10-15 minutes until you have consumed HALF the jug. You should complete by 6:00pm.You must keep the left over solution refrigerated until completed next day.  Continue clear liquids. You must drink plenty of clear liquids to prevent dehyration and kidney failure.     DAY OF PROCEDURE:   DATE: 02/23/2019   DAY: Wednesday If you take medications for your heart, blood pressure or breathing, you may take these medications.    Five hours before your procedure time @ 3:30 am:  Finish remaining amout of bowel prep, drinking 1 (one) 8 ounce glass every 10-15 minutes until complete. You have two hours to consume remaining prep.   Three hours before your procedure time @ 5:30 am:  Nothing by mouth.   At least one hour before going to the hospital:  Give yourself one Fleet enema. You may take your morning medications with sip of water unless we have instructed otherwise.      Please see below for Dietary Information.  CLEAR LIQUIDS INCLUDE:  Water Jello (NOT red in color)   Ice Popsicles (NOT red in color)   Tea (sugar ok, no milk/cream) Powdered fruit flavored drinks  Coffee (sugar ok, no milk/cream) Gatorade/ Lemonade/ Kool-Aid  (NOT red in  color)   Juice: apple, white grape, white cranberry Soft drinks  Clear bullion, consomme, broth (fat free beef/chicken/vegetable)  Carbonated beverages (any kind)  Strained chicken noodle soup Hard Candy   Remember: Clear liquids are liquids that will allow you to see your fingers on the other side of a clear glass. Be sure liquids are NOT red in color, and not cloudy, but CLEAR.  DO NOT EAT OR DRINK ANY OF THE FOLLOWING:  Dairy products of any kind   Cranberry juice Tomato juice / V8 juice   Grapefruit juice Orange juice     Red grape juice  Do not eat any solid foods, including such foods as: cereal, oatmeal, yogurt, fruits, vegetables, creamed soups, eggs, bread, crackers, pureed foods in a blender, etc.   HELPFUL HINTS FOR DRINKING PREP SOLUTION:   Make sure prep is extremely cold. Mix and refrigerate the the morning of the prep. You may also put in the freezer.   You may try mixing some Crystal Light or Country Time Lemonade if you prefer. Mix in small amounts; add more if necessary.  Try drinking through a straw  Rinse mouth with water or a mouthwash between glasses, to remove after-taste.  Try sipping on a cold beverage /ice/ popsicles between glasses of prep.  Place a piece of sugar-free hard candy in mouth between glasses.  If you become nauseated, try consuming smaller amounts, or stretch  out the time between glasses. Stop for 30-60 minutes, then slowly start back drinking.        OTHER INSTRUCTIONS  You will need a responsible adult at least 52 years of age to accompany you and drive you home. This person must remain in the waiting room during your procedure. The hospital will cancel your procedure if you do not have a responsible adult with you.   1. Wear loose fitting clothing that is easily removed. 2. Leave jewelry and other valuables at home.  3. Remove all body piercing jewelry and leave at home. 4. Total time from sign-in until discharge is approximately  2-3 hours. 5. You should go home directly after your procedure and rest. You can resume normal activities the day after your procedure. 6. The day of your procedure you should not:  Drive  Make legal decisions  Operate machinery  Drink alcohol  Return to work   You may call the office (Dept: [phone]) before 5:00pm, or page the doctor on call [phone]) after 5:00pm, for further instructions, if necessary.   Insurance Information YOU WILL NEED TO CHECK WITH YOUR INSURANCE COMPANY FOR THE BENEFITS OF COVERAGE YOU HAVE FOR THIS PROCEDURE.  UNFORTUNATELY, NOT ALL INSURANCE COMPANIES HAVE BENEFITS TO COVER ALL OR PART OF THESE TYPES OF PROCEDURES.  IT IS YOUR RESPONSIBILITY TO CHECK YOUR BENEFITS, HOWEVER, WE WILL BE GLAD TO ASSIST YOU WITH ANY CODES YOUR INSURANCE COMPANY MAY NEED.    PLEASE NOTE THAT MOST INSURANCE COMPANIES WILL NOT COVER A SCREENING COLONOSCOPY FOR PEOPLE UNDER THE AGE OF 50  IF YOU HAVE BCBS INSURANCE, YOU MAY HAVE BENEFITS FOR A SCREENING COLONOSCOPY BUT IF POLYPS ARE FOUND THE DIAGNOSIS WILL CHANGE AND THEN YOU MAY HAVE A DEDUCTIBLE THAT WILL NEED TO BE MET. SO PLEASE MAKE SURE YOU CHECK YOUR BENEFITS FOR A SCREENING COLONOSCOPY AS WELL AS A DIAGNOSTIC COLONOSCOPY.

## 2018-11-26 NOTE — Progress Notes (Signed)
Appropriate.

## 2018-11-29 ENCOUNTER — Telehealth: Payer: Self-pay | Admitting: *Deleted

## 2018-11-29 NOTE — Addendum Note (Signed)
Addended by: Metro Kung on: 11/29/2018 09:17 AM   Modules accepted: Orders, SmartSet

## 2018-11-29 NOTE — Telephone Encounter (Signed)
Procedure canceled

## 2018-11-29 NOTE — Telephone Encounter (Signed)
Spoke with pt and she would like to keep her procedure with [place] and cancel the one with Dr. Laural Golden.  Informed pt that I would inform Dr. Olevia Perches office.  Forwarding to Lelon Frohlich at Dr. Olevia Perches office.

## 2019-02-01 ENCOUNTER — Other Ambulatory Visit: Payer: Self-pay | Admitting: Cardiovascular Disease

## 2019-02-03 ENCOUNTER — Ambulatory Visit (HOSPITAL_COMMUNITY): Admit: 2019-02-03 | Payer: BC Managed Care – PPO | Admitting: Internal Medicine

## 2019-02-03 ENCOUNTER — Encounter (HOSPITAL_COMMUNITY): Payer: Self-pay

## 2019-02-03 SURGERY — COLONOSCOPY
Anesthesia: Moderate Sedation

## 2019-02-21 ENCOUNTER — Other Ambulatory Visit (HOSPITAL_COMMUNITY)
Admission: RE | Admit: 2019-02-21 | Discharge: 2019-02-21 | Disposition: A | Discharge status: Home / Self Care | Payer: BC Managed Care – PPO | Source: Ambulatory Visit | Attending: Gastroenterology | Admitting: Gastroenterology

## 2019-02-21 ENCOUNTER — Other Ambulatory Visit: Payer: Self-pay

## 2019-02-21 ENCOUNTER — Other Ambulatory Visit (HOSPITAL_COMMUNITY): Payer: BC Managed Care – PPO

## 2019-02-21 DIAGNOSIS — Z01812 Encounter for preprocedural laboratory examination: Secondary | ICD-10-CM | POA: Insufficient documentation

## 2019-02-21 DIAGNOSIS — Z20828 Contact with and (suspected) exposure to other viral communicable diseases: Secondary | ICD-10-CM | POA: Diagnosis not present

## 2019-02-22 LAB — SARS CORONAVIRUS 2 (TAT 6-24 HRS): SARS Coronavirus 2: NEGATIVE

## 2019-02-23 ENCOUNTER — Other Ambulatory Visit: Payer: Self-pay

## 2019-02-23 ENCOUNTER — Encounter (HOSPITAL_COMMUNITY)
Admission: RE | Disposition: A | Discharge status: Home / Self Care | Payer: Self-pay | Source: Home / Self Care | Attending: Gastroenterology

## 2019-02-23 ENCOUNTER — Encounter (HOSPITAL_COMMUNITY): Payer: Self-pay | Admitting: Gastroenterology

## 2019-02-23 ENCOUNTER — Ambulatory Visit (HOSPITAL_COMMUNITY)
Admission: RE | Admit: 2019-02-23 | Discharge: 2019-02-23 | Disposition: A | Discharge status: Home / Self Care | Payer: BC Managed Care – PPO | Attending: Gastroenterology | Admitting: Gastroenterology

## 2019-02-23 DIAGNOSIS — D120 Benign neoplasm of cecum: Secondary | ICD-10-CM | POA: Insufficient documentation

## 2019-02-23 DIAGNOSIS — K644 Residual hemorrhoidal skin tags: Secondary | ICD-10-CM | POA: Insufficient documentation

## 2019-02-23 DIAGNOSIS — Z7982 Long term (current) use of aspirin: Secondary | ICD-10-CM | POA: Diagnosis not present

## 2019-02-23 DIAGNOSIS — Z951 Presence of aortocoronary bypass graft: Secondary | ICD-10-CM | POA: Insufficient documentation

## 2019-02-23 DIAGNOSIS — D123 Benign neoplasm of transverse colon: Secondary | ICD-10-CM | POA: Diagnosis not present

## 2019-02-23 DIAGNOSIS — Z1211 Encounter for screening for malignant neoplasm of colon: Secondary | ICD-10-CM | POA: Diagnosis not present

## 2019-02-23 DIAGNOSIS — I252 Old myocardial infarction: Secondary | ICD-10-CM | POA: Diagnosis not present

## 2019-02-23 DIAGNOSIS — Z79899 Other long term (current) drug therapy: Secondary | ICD-10-CM | POA: Insufficient documentation

## 2019-02-23 DIAGNOSIS — K635 Polyp of colon: Secondary | ICD-10-CM

## 2019-02-23 DIAGNOSIS — I10 Essential (primary) hypertension: Secondary | ICD-10-CM | POA: Insufficient documentation

## 2019-02-23 DIAGNOSIS — Q439 Congenital malformation of intestine, unspecified: Secondary | ICD-10-CM | POA: Insufficient documentation

## 2019-02-23 DIAGNOSIS — K648 Other hemorrhoids: Secondary | ICD-10-CM | POA: Diagnosis not present

## 2019-02-23 HISTORY — PX: POLYPECTOMY: SHX5525

## 2019-02-23 HISTORY — PX: COLONOSCOPY: SHX5424

## 2019-02-23 SURGERY — COLONOSCOPY
Anesthesia: Moderate Sedation

## 2019-02-23 MED ORDER — MIDAZOLAM HCL 5 MG/5ML IJ SOLN
INTRAMUSCULAR | Status: AC
  Filled 2019-02-23: qty 10

## 2019-02-23 MED ORDER — MEPERIDINE HCL 100 MG/ML IJ SOLN
INTRAMUSCULAR | Status: AC
  Filled 2019-02-23: qty 2

## 2019-02-23 MED ORDER — MIDAZOLAM HCL 5 MG/5ML IJ SOLN
INTRAMUSCULAR | Status: DC | PRN
  Administered 2019-02-23: 2 mg via INTRAVENOUS
  Administered 2019-02-23: 1 mg via INTRAVENOUS

## 2019-02-23 MED ORDER — MEPERIDINE HCL 100 MG/ML IJ SOLN
INTRAMUSCULAR | Status: DC | PRN
  Administered 2019-02-23 (×2): 25 mg via INTRAVENOUS

## 2019-02-23 MED ORDER — STERILE WATER FOR IRRIGATION IR SOLN: Status: DC | PRN

## 2019-02-23 MED ORDER — SODIUM CHLORIDE 0.9 % IV SOLN: INTRAVENOUS | Status: DC

## 2019-02-23 NOTE — Op Note (Signed)
[facility] Patient Name: Ashlee Stein Procedure Date: 02/23/2019 7:06 AM MRN: [id number] Date of Birth: Apr 03, 1965 Attending MD: Barney Drain MD, MD CSN: [id number] Age: 53 Admit Type: Outpatient Procedure:                Colonoscopy WITH COLD SNARE POLYPECTOMY Indications:              Screening for colorectal malignant neoplasm Providers:                Barney Drain MD, MD, Hinton Rao, RN, Aram Candela Referring MD:             Rosita Fire MD, MD Medicines:                Meperidine 50 mg IV, Midazolam 3 mg IV Complications:            No immediate complications. Estimated Blood Loss:     Estimated blood loss was minimal. Procedure:                Pre-Anesthesia Assessment:                           - Prior to the procedure, a History and Physical                            was performed, and patient medications and                            allergies were reviewed. The patient's tolerance of                            previous anesthesia was also reviewed. The risks                            and benefits of the procedure and the sedation                            options and risks were discussed with the patient.                            All questions were answered, and informed consent                            was obtained. Prior Anticoagulants: The patient has                            taken no previous anticoagulant or antiplatelet                            agents except for aspirin. ASA Grade Assessment:                            III - A patient with severe systemic disease. After                            reviewing the risks and benefits, the patient was  deemed in satisfactory condition to undergo the                            procedure. After obtaining informed consent, the                            colonoscope was passed under direct vision.                            Throughout the procedure, the patient's blood                  pressure, pulse, and oxygen saturations were                            monitored continuously. The [id number]-[id number]                            scope was introduced through the anus and advanced                            to the the cecum, identified by appendiceal orifice                            and ileocecal valve. The colonoscopy was somewhat                            difficult due to a tortuous colon. Successful                            completion of the procedure was aided by                            straightening and shortening the scope to obtain                            bowel loop reduction and COLOWRAP. The patient                            tolerated the procedure well. The quality of the                            bowel preparation was good. The ileocecal valve,                            appendiceal orifice, and rectum were photographed. Scope In: 8:53:52 AM Scope Out: 9:14:11 AM Scope Withdrawal Time: 0 hours 18 minutes 10 seconds  Total Procedure Duration: 0 hours 20 minutes 19 seconds  Findings:      Three sessile polyps were found in the splenic flexure, hepatic flexure       and cecum. The polyps were 2 to 4 mm in size. These polyps were removed       with a cold snare. Resection and retrieval were complete.      A 3 mm polyp was found in the sigmoid colon. The  polyp was sessile. The       polyp was removed with a cold snare. Resection and retrieval were       complete.      External and internal hemorrhoids were found.      The recto-sigmoid colon and sigmoid colon were mildly tortuous. Impression:               - Three 2 to 4 mm polyps at the splenic flexure, at                            the hepatic flexure and in the cecum, removed with                            a cold snare. Resected and retrieved.                           - One 3 mm polyp in the sigmoid colon, removed with                            a cold snare. Resected and  retrieved.                           - External and internal hemorrhoids.                           - Tortuous colon. Moderate Sedation:      Moderate (conscious) sedation was administered by the endoscopy nurse       and supervised by the endoscopist. The following parameters were       monitored: oxygen saturation, heart rate, blood pressure, and response       to care. Total physician intraservice time was 30 minutes. Recommendation:           - Patient has a contact number available for                            emergencies. The signs and symptoms of potential                            delayed complications were discussed with the                            patient. Return to normal activities tomorrow.                            Written discharge instructions were provided to the                            patient.                           - High fiber diet.                           - Continue present medications.                           -  Await pathology results.                           - Repeat colonoscopy date to be determined after                            pending pathology results are reviewed for                            surveillance. Procedure Code(s):        --- Professional ---                           [phone], Colonoscopy, flexible; with removal of                            tumor(s), polyp(s), or other lesion(s) by snare                            technique                           99153, Moderate sedation; each additional 15                            minutes intraservice time                           G0500, Moderate sedation services provided by the                            same physician or other qualified health care                            professional performing a gastrointestinal                            endoscopic service that sedation supports,                            requiring the presence of an independent trained                             observer to assist in the monitoring of the                            patient's level of consciousness and physiological                            status; initial 15 minutes of intra-service time;                            patient age 63 years or older (additional time may                            be reported with [phone],  as appropriate) Diagnosis Code(s):        --- Professional ---                           K63.5, Polyp of colon                           Z12.11, Encounter for screening for malignant                            neoplasm of colon                           K64.8, Other hemorrhoids                           Q43.8, Other specified congenital malformations of                            intestine CPT copyright 2019 [employer] Association. All rights reserved. The codes documented in this report are preliminary and upon coder review may  be revised to meet current compliance requirements. Barney Drain, MD Barney Drain MD, MD 02/23/2019 9:30:01 AM This report has been signed electronically. Number of Addenda: 0

## 2019-02-23 NOTE — H&P (Signed)
Primary Care Physician:  Rosita Fire, MD Primary Gastroenterologist:  Dr. Oneida Alar  Pre-Procedure History & Physical: HPI:  Ashlee Stein is a 53 y.o. female here for [place].  Past Medical History:  Diagnosis Date  . Angina   . Asthma    "as a child"  . Coronary artery dissection    NSTEMI 8/12: cath with mid to dist LAD spont. dissection, unable to do PCI and referred for CABG:  SVG to LAD (Dr. PVT);  Carotid U/S 6/14:  Normal carotid arteries; FMD could not be ruled out  . Coronary artery dissection 10/24/2010  . Dyslipidemia   . GERD (gastroesophageal reflux disease)   . Heart disease   . Hypertension   . Iron deficiency anemia   . Obesity     Past Surgical History:  Procedure Laterality Date  . BILATERAL SALPINGECTOMY Bilateral 08/04/2017   Procedure: BILATERAL SALPINGECTOMY;  Surgeon: Jonnie Kind, MD;  Location: [facility];  Service: Gynecology;  Laterality: Bilateral;  . CARDIAC SURGERY     open heart surgery  . CERVICAL POLYPECTOMY  07/01/2011   Procedure: CERVICAL POLYPECTOMY;  Surgeon: Jonnie Kind, MD;  Location: [facility];  Service: Gynecology;  Laterality: N/A;  Endometrial Polypectomy  . CORONARY ARTERY BYPASS GRAFT  10/24/2010   CABG X1:  coronary artery dissection -->CABG:  SVG to LAD (Dr. PVT)  . DILATION AND CURETTAGE OF UTERUS    . HYSTEROSCOPY W/ ENDOMETRIAL ABLATION    . LAPAROSCOPIC TUBAL LIGATION  07/01/2011   Procedure: LAPAROSCOPIC TUBAL LIGATION;  Surgeon: Jonnie Kind, MD;  Location: [facility];  Service: Gynecology;  Laterality: Bilateral;  Laparoscopic Bilateral Tubal Sterilization with Fallope Rings; ended at 1143  . SUPRACERVICAL ABDOMINAL HYSTERECTOMY N/A 08/04/2017   Procedure: HYSTERECTOMY SUPRACERVICAL ABDOMINAL;  Surgeon: Jonnie Kind, MD;  Location: [facility];  Service: Gynecology;  Laterality: N/A;  . TUBAL LIGATION    . UMBILICAL HERNIA REPAIR  07/01/2011   Procedure: HERNIA REPAIR UMBILICAL ADULT;  Surgeon: Jonnie Kind, MD;   Location: [facility];  Service: Gynecology;  Laterality: N/A;  during Laparoscopic Bilateral Tubal Sterilization  . VENTRAL HERNIA REPAIR N/A 08/04/2017   Procedure: HERNIA REPAIR VENTRAL ADULT;  Surgeon: Jonnie Kind, MD;  Location: [facility];  Service: Gynecology;  Laterality: N/A;    Prior to Admission medications   Medication Sig Start Date End Date Taking? Authorizing Provider  aspirin EC 81 MG tablet Take 1 tablet (81 mg total) by mouth daily. 12/06/10  Yes Hillary Bow, MD  atorvastatin (LIPITOR) 20 MG tablet TAKE 1 TABLET DAILY Patient taking differently: Take 20 mg by mouth daily at 12 noon.  02/01/19  Yes Herminio Commons, MD  latanoprost (XALATAN) 0.005 % ophthalmic solution Place 1 drop into both eyes at bedtime. 05/24/17  Yes [provider]  lisinopril-hydrochlorothiazide (PRINZIDE,ZESTORETIC) 20-12.5 MG tablet Take 1 tablet by mouth 2 (two) times daily.  01/14/15  Yes [provider]  metoprolol tartrate (LOPRESSOR) 25 MG tablet TAKE 1 & 1/2 TABLETS BY MOUTH TWICE A DAY Patient taking differently: Take 37.5 mg by mouth 2 (two) times daily.  09/20/15  Yes Herminio Commons, MD  Multiple Vitamin (MULITIVITAMIN WITH MINERALS) TABS Take 1 tablet by mouth daily.    Yes [provider]  omeprazole (PRILOSEC) 40 MG capsule Take 40 mg by mouth daily as needed (reflux).  09/03/16  Yes [provider]  polyethylene glycol powder (MIRALAX) powder Take 17 g by mouth daily. To prevent constipation  Patient taking differently: Take 17 g by mouth daily as needed for mild constipation. To prevent constipation 08/06/17  Yes Jonnie Kind, MD    Allergies as of 11/29/2018 - Review Complete 11/24/2018  Allergen Reaction Noted  . Morphine and related Rash 06/20/2011  . Penicillins Rash 06/20/2011    Family History  Problem Relation Age of Onset  . Heart attack Sister   . Diabetes Mother   . Hypertension Mother   . Heart disease Mother   . Cancer  Brother 50       prostate  . Diabetes Sister   . Hypertension Sister   . Hypertension Sister     Social History   Socioeconomic History  . Marital status: Unknown    Spouse name: Not on file  . Number of children: Not on file  . Years of education: Not on file  . Highest education level: Not on file  Occupational History  . Not on file  Tobacco Use  . Smoking status: Never Smoker  . Smokeless tobacco: Never Used  Substance and Sexual Activity  . Alcohol use: No    Alcohol/week: 0.0 standard drinks  . Drug use: No  . Sexual activity: Not Currently    Birth control/protection: Surgical    Comment: supracervical hyst  Other Topics Concern  . Not on file  Social History Narrative  . Not on file   Social Determinants of Health   Financial Resource Strain:   . Difficulty of Paying Living Expenses: Not on file  Food Insecurity:   . Worried About Charity fundraiser in the Last Year: Not on file  . Ran Out of Food in the Last Year: Not on file  Transportation Needs:   . Lack of Transportation (Medical): Not on file  . Lack of Transportation (Non-Medical): Not on file  Physical Activity:   . Days of Exercise per Week: Not on file  . Minutes of Exercise per Session: Not on file  Stress:   . Feeling of Stress : Not on file  Social Connections:   . Frequency of Communication with Friends and Family: Not on file  . Frequency of Social Gatherings with Friends and Family: Not on file  . Attends Religious Services: Not on file  . Active Member of Clubs or Organizations: Not on file  . Attends Archivist Meetings: Not on file  . Marital Status: Not on file  Intimate Partner Violence:   . Fear of Current or Ex-Partner: Not on file  . Emotionally Abused: Not on file  . Physically Abused: Not on file  . Sexually Abused: Not on file    Review of Systems: See HPI, otherwise negative ROS   Physical Exam: BP (!) 145/94   Pulse 69   Temp 98 F (36.7 C) (Oral)    Resp 18   Ht 7' (2.134 m)   LMP 07/23/2017 (Approximate)   SpO2 99%   BMI 26.01 kg/m  General:   Alert,  pleasant and cooperative in NAD Head:  Normocephalic and atraumatic. Neck:  Supple; Lungs:  Clear throughout to auscultation.    Heart:  Regular rate and rhythm. Abdomen:  Soft, nontender and nondistended. Normal bowel sounds, without guarding, and without rebound.   Neurologic:  Alert and  oriented x4;  grossly normal neurologically.  Impression/Plan:    SCREENING  Plan:  1. TCS TODAY DISCUSSED PROCEDURE, BENEFITS, & RISKS: < 1% chance of medication reaction, bleeding, perforation, ASPIRATION, or rupture of spleen/liver requiring surgery  to fix it and missed polyps < 1 cm 10-20% of the time.

## 2019-02-23 NOTE — Discharge Instructions (Signed)
You had 4 small polyps removed. You have internal and external hemorrhoids.   EAT TO LIVE AND THINK OF FOOD AS MEDICINE. 75% OF YOUR PLATE SHOULD BE FRUITS/VEGGIES.  To have more energy, and to lose weight:      1. CONTINUE YOUR WEIGHT LOSS EFFORTS.  CONTINUE YOUR WEIGHT LOSS EFFORTS. YOUR BODY MASS INDEX (BMI) IS OVER 40 WHICH MEANS YOU ARE MORBIDLY OBESE. OBESITY ACTIVATES CANCER GENES. MORBID OBESITY SHORTENS YOUR LIFE EXPECTANCY 10 YEARS. OBESITY IS ASSOCIATED WITH AN INCREASE RISK FOR CIRRHOSIS, COLON POLYPS, AND ALL CANCERS, INCLUDING ESOPHAGEAL AND COLON CANCER. I RECOMMEND YOU READ AND FOLLOW RECOMMENDATIONS of DR. MARK HYMAN, "10-DAY DETOX DIET".  OTHERWISE, FOLLOW A HIGH FIBER DIET. AVOID ITEMS THAT CAUSE BLOATING. See info below.     2. If you must eat bread, EAT EZEKIEL BREAD. IT IS IN THE FROZEN SECTION OF THE GROCERY STORE.    3. DRINK WATER WITH FRUIT OR CUCUMBER ADDED. YOUR URINE SHOULD BE LIGHT YELLOW. AVOID SODA, GATORADE, ENERGY DRINKS, OR DIET SODA.     4. AVOID HIGH FRUCTOSE CORN SYRUP AND CAFFEINE.     5. DO NOT chew SUGAR FREE GUM OR USE ARTIFICIAL SWEETENERS. IF NEEDED USE STEVIA AS A SWEETENER.    6. DO NOT EAT ENRICHED WHEAT FLOUR, PASTA, RICE, OR CEREAL.    7. ONLY EAT WILD CAUGHT SEAFOOD, GRASS FED BEEF OR CHICKEN, PORK FROM PASTURE RAISE PIGS, OR EGGS FROM PASTURE RAISED CHICKENS.    8. PRACTICE CHAIR YOGA FOR 15-30 MINS 3 OR 4 TIMES A WEEK AND PROGRESS TO HATHA YOGA OVER NEXT 6 MOS.    9. START TAKING A MULTIVITAMIN, VITAMIN B12, AND VITAMIN D3 2000 IU DAILY.   ADDITIONAL SUPPLEMENTS TO DECREASE CRAVING AND SUPPRESS YOUR APPETITE:    1. CINNAMON 500 MG EVERY AM PRIOR TO FIRST MEAL.   **STABILIZES BLOOD GLUCOSE/REDUCES CRAVINGS**    2. CHROMIUM 400-500 MG WITH MEALS TWICE DAILY.    **FAT BURNER**    3. GREEN TEA EXTRACT ONE DAILY.   **FAT BURNER/SUPPRESSES YOUR APPETITE**    4. ALPHA LIPOIC ACID 250 mg TWICE DAILY.   **NATURAL ANTI-INFLAMMATORY  SUPPLEMENT THAT IS AN ALTERNATIVE TO IBUPROFEN OR NAPROXEN**    YOUR BIOPSY RESULTS WILL BE BACK IN 5 BUSINESS DAYS.  YOUR next colonoscopy WILL BE SCHEDULED BASED ON YOUR FINAL PATHOLOGY REPORT.    Colonoscopy Care After Read the instructions outlined below and refer to this sheet in the next week. These discharge instructions provide you with general information on caring for yourself after you leave the hospital. While your treatment has been planned according to the most current medical practices available, unavoidable complications occasionally occur. If you have any problems or questions after discharge, call DR. Vinayak Bobier, [phone].  ACTIVITY  You may resume your regular activity, but move at a slower pace for the next 24 hours.   Take frequent rest periods for the next 24 hours.   Walking will help get rid of the air and reduce the bloated feeling in your belly (abdomen).   No driving for 24 hours (because of the medicine (anesthesia) used during the test).   You may shower.   Do not sign any important legal documents or operate any machinery for 24 hours (because of the anesthesia used during the test).    NUTRITION  Drink plenty of fluids.   You may resume your normal diet as instructed by your doctor.   Begin with a light meal and progress to your  normal diet. Heavy or fried foods are harder to digest and may make you feel sick to your stomach (nauseated).   Avoid alcoholic beverages for 24 hours or as instructed.    MEDICATIONS  You may resume your normal medications.   WHAT YOU CAN EXPECT TODAY  Some feelings of bloating in the abdomen.   Passage of more gas than usual.   Spotting of blood in your stool or on the toilet paper  .  IF YOU HAD POLYPS REMOVED DURING THE COLONOSCOPY:  Eat a soft diet IF YOU HAVE NAUSEA, BLOATING, ABDOMINAL PAIN, OR VOMITING.    FINDING OUT THE RESULTS OF YOUR TEST Not all test results are available during your  visit. DR. Oneida Alar WILL CALL YOU WITHIN 14 DAYS OF YOUR PROCEDUE WITH YOUR RESULTS. Do not assume everything is normal if you have not heard from DR. Sally Reimers, CALL HER OFFICE AT [phone].  SEEK IMMEDIATE MEDICAL ATTENTION AND CALL THE OFFICE: [phone] IF:  You have more than a spotting of blood in your stool.   Your belly is swollen (abdominal distention).   You are nauseated or vomiting.   You have a temperature over 101F.   You have abdominal pain or discomfort that is severe or gets worse throughout the day.   High-Fiber Diet A high-fiber diet changes your normal diet to include more whole grains, legumes, fruits, and vegetables. Changes in the diet involve replacing refined carbohydrates with unrefined foods. The calorie level of the diet is essentially unchanged. The Dietary Reference Intake (recommended amount) for adult males is 38 grams per day. For adult females, it is 25 grams per day. Pregnant and lactating women should consume 28 grams of fiber per day. Fiber is the intact part of a plant that is not broken down during digestion. Functional fiber is fiber that has been isolated from the plant to provide a beneficial effect in the body.  PURPOSE  Increase stool bulk.   Ease and regulate bowel movements.   Lower cholesterol.   REDUCE RISK OF COLON CANCER  INDICATIONS THAT YOU NEED MORE FIBER  Constipation and hemorrhoids.   Uncomplicated diverticulosis (intestine condition) and irritable bowel syndrome.   Weight management.   As a protective measure against hardening of the arteries (atherosclerosis), diabetes, and cancer.   GUIDELINES FOR INCREASING FIBER IN THE DIET  Start adding fiber to the diet slowly. A gradual increase of about 5 more grams (2 servings of most fruits or vegetables) per day is best. Too rapid an increase in fiber may result in constipation, flatulence, and bloating.   Drink enough water and fluids to keep your urine clear or pale  yellow. Water, juice, or caffeine-free drinks are recommended. Not drinking enough fluid may cause constipation.   Eat a variety of high-fiber foods rather than one type of fiber.   Try to increase your intake of fiber through using high-fiber foods rather than fiber pills or supplements that contain small amounts of fiber.   The goal is to change the types of food eaten. Do not supplement your present diet with high-fiber foods, but replace foods in your present diet.    Polyps, Colon  A polyp is extra tissue that grows inside your body. Colon polyps grow in the large intestine. The large intestine, also called the colon, is part of your digestive system. It is a long, hollow tube at the end of your digestive tract where your body makes and stores stool. Most polyps are not dangerous. They  are benign. This means they are not cancerous. But over time, some types of polyps can turn into cancer. Polyps that are smaller than a pea are usually not harmful. But larger polyps could someday become or may already be cancerous. To be safe, doctors remove all polyps and test them.   WHO GETS POLYPS? Anyone can get polyps, but certain people are more likely than others. You may have a greater chance of getting polyps if:  You are over 50.   You have had polyps before.   Someone in your family has had polyps.   Someone in your family has had cancer of the large intestine.   Find out if someone in your family has had polyps. You may also be more likely to get polyps if you:   Eat a lot of fatty foods   Smoke   Drink alcohol   Do not exercise  Eat too much   PREVENTION There is not one sure way to prevent polyps. You might be able to lower your risk of getting them if you:  Eat more fruits and vegetables and less fatty food.   Do not smoke.   Avoid alcohol.   Exercise every day.   Lose weight if you are overweight.   Eating more calcium and folate can also lower your risk of getting  polyps. Some foods that are rich in calcium are milk, cheese, and broccoli. Some foods that are rich in folate are chickpeas, kidney beans, and spinach.

## 2019-02-24 LAB — SURGICAL PATHOLOGY

## 2019-03-28 DIAGNOSIS — H401131 Primary open-angle glaucoma, bilateral, mild stage: Secondary | ICD-10-CM | POA: Diagnosis not present

## 2019-04-01 DIAGNOSIS — I2510 Atherosclerotic heart disease of native coronary artery without angina pectoris: Secondary | ICD-10-CM | POA: Diagnosis not present

## 2019-04-01 DIAGNOSIS — K219 Gastro-esophageal reflux disease without esophagitis: Secondary | ICD-10-CM | POA: Diagnosis not present

## 2019-04-01 DIAGNOSIS — I10 Essential (primary) hypertension: Secondary | ICD-10-CM | POA: Diagnosis not present

## 2019-04-12 ENCOUNTER — Ambulatory Visit (INDEPENDENT_AMBULATORY_CARE_PROVIDER_SITE_OTHER): Payer: BC Managed Care – PPO | Admitting: Cardiovascular Disease

## 2019-04-12 ENCOUNTER — Other Ambulatory Visit: Payer: Self-pay

## 2019-04-12 ENCOUNTER — Encounter: Payer: Self-pay | Admitting: Cardiovascular Disease

## 2019-04-12 VITALS — BP 150/96 | HR 72 | Temp 98.1°F | Ht 67.0 in | Wt 291.0 lb

## 2019-04-12 DIAGNOSIS — I25708 Atherosclerosis of coronary artery bypass graft(s), unspecified, with other forms of angina pectoris: Secondary | ICD-10-CM

## 2019-04-12 DIAGNOSIS — E785 Hyperlipidemia, unspecified: Secondary | ICD-10-CM

## 2019-04-12 DIAGNOSIS — I10 Essential (primary) hypertension: Secondary | ICD-10-CM

## 2019-04-12 NOTE — Patient Instructions (Signed)
Medication Instructions:  Your physician recommends that you continue on your current medications as directed. Please refer to the Current Medication list given to you today.  *If you need a refill on your cardiac medications before your next appointment, please call your pharmacy*  Lab Work: None today If you have labs (blood work) drawn today and your tests are completely normal, you will receive your results only by: . MyChart Message (if you have MyChart) OR . A paper copy in the mail If you have any lab test that is abnormal or we need to change your treatment, we will call you to review the results.  Testing/Procedures: None today  Follow-Up: At [facility], you and your health needs are our priority.  As part of our continuing mission to provide you with exceptional heart care, we have created designated Provider Care Teams.  These Care Teams include your primary Cardiologist (physician) and Advanced Practice Providers (APPs -  Physician Assistants and Nurse Practitioners) who all work together to provide you with the care you need, when you need it.  Your next appointment:   12 months  The format for your next appointment:   In Person  Provider:   Suresh Koneswaran, MD  Other Instructions None      Thank you for choosing [facility] !         

## 2019-04-12 NOTE — Progress Notes (Signed)
SUBJECTIVE: The patient returns for routine follow-up. She has a history of coronary artery disease and underwent 1-vessel CABG (SVG to LAD) in August 2012.  She also has a history of hypertension.  Most recent echocardiogram I find is dated 11/21/10 and showed normal left ventricular systolic function and regional wall motion, LVEF 60-65%, mild mitral regurgitation, mild biatrial dilatation, mild right ventricular dilatation, and moderate tricuspid regurgitation, PA pressure 34 mmHg.  Nuclear stress test was normal on 01/09/2017, EF 69%.  The patient denies any symptoms of chest pain, palpitations, shortness of breath, lightheadedness, dizziness, leg swelling, orthopnea, PND, and syncope.  She is frustrated by weight gain during the pandemic but intends to lose it.    SocHx:Married. Works doing Sales executive for [employer]. Lives in [place].Has 2 children.  Her husband works for [employer].  She has a 54 year old daughter (2020) who is completed her masters in Environmental health practitioner and is now working for the [employer]  She has a 8 year old son (2020) who is a Equities trader in high school in [place] and taking advanced classes.  Review of Systems: As per "subjective", otherwise negative.  Allergies  Allergen Reactions  . Morphine And Related Rash  . Penicillins Rash    Has patient had a PCN reaction causing immediate rash, facial/tongue/throat swelling, SOB or lightheadedness with hypotension: yes Has patient had a PCN reaction causing severe rash involving mucus membranes or skin necrosis: no Has patient had a PCN reaction that required hospitalization: no Has patient had a PCN reaction occurring within the last 10 years: no If all of the above answers are "NO", then may proceed with Cephalosporin use.     Current Outpatient Medications  Medication Sig Dispense Refill  . aspirin EC 81 MG tablet Take 1 tablet (81 mg total) by mouth daily.     Marland Kitchen atorvastatin (LIPITOR) 20 MG tablet TAKE 1 TABLET DAILY (Patient taking differently: Take 20 mg by mouth daily at 12 noon. ) 90 tablet 0  . latanoprost (XALATAN) 0.005 % ophthalmic solution Place 1 drop into both eyes at bedtime.  3  . lisinopril-hydrochlorothiazide (PRINZIDE,ZESTORETIC) 20-12.5 MG tablet Take 1 tablet by mouth 2 (two) times daily.   3  . metoprolol tartrate (LOPRESSOR) 25 MG tablet TAKE 1 & 1/2 TABLETS BY MOUTH TWICE A DAY (Patient taking differently: Take 37.5 mg by mouth 2 (two) times daily. ) 270 tablet 3  . Multiple Vitamin (MULITIVITAMIN WITH MINERALS) TABS Take 1 tablet by mouth daily.     Marland Kitchen omeprazole (PRILOSEC) 40 MG capsule Take 40 mg by mouth daily as needed (reflux).   3  . polyethylene glycol powder (MIRALAX) powder Take 17 g by mouth daily. To prevent constipation (Patient taking differently: Take 17 g by mouth daily as needed for mild constipation. To prevent constipation) 255 g prn   No current facility-administered medications for this visit.    Past Medical History:  Diagnosis Date  . Angina   . Asthma    "as a child"  . Coronary artery dissection    NSTEMI 8/12: cath with mid to dist LAD spont. dissection, unable to do PCI and referred for CABG:  SVG to LAD (Dr. PVT);  Carotid U/S 6/14:  Normal carotid arteries; FMD could not be ruled out  . Coronary artery dissection 10/24/2010  . Dyslipidemia   . GERD (gastroesophageal reflux disease)   . Heart disease   . Hypertension   . Iron deficiency anemia   . Obesity  Past Surgical History:  Procedure Laterality Date  . BILATERAL SALPINGECTOMY Bilateral 08/04/2017   Procedure: BILATERAL SALPINGECTOMY;  Surgeon: Jonnie Kind, MD;  Location: [facility];  Service: Gynecology;  Laterality: Bilateral;  . CARDIAC SURGERY     open heart surgery  . CERVICAL POLYPECTOMY  07/01/2011   Procedure: CERVICAL POLYPECTOMY;  Surgeon: Jonnie Kind, MD;  Location: [facility];  Service: Gynecology;  Laterality: N/A;   Endometrial Polypectomy  . COLONOSCOPY N/A 02/23/2019   Procedure: COLONOSCOPY;  Surgeon: Danie Binder, MD;  [facility]: [facility];  Service: Endoscopy;  Laterality: N/A;  8:30  . CORONARY ARTERY BYPASS GRAFT  10/24/2010   CABG X1:  coronary artery dissection -->CABG:  SVG to LAD (Dr. PVT)  . DILATION AND CURETTAGE OF UTERUS    . HYSTEROSCOPY W/ ENDOMETRIAL ABLATION    . LAPAROSCOPIC TUBAL LIGATION  07/01/2011   Procedure: LAPAROSCOPIC TUBAL LIGATION;  Surgeon: Jonnie Kind, MD;  Location: [facility];  Service: Gynecology;  Laterality: Bilateral;  Laparoscopic Bilateral Tubal Sterilization with Fallope Rings; ended at 1143  . POLYPECTOMY  02/23/2019   Procedure: POLYPECTOMY;  Surgeon: Danie Binder, MD;  [facility]: [facility];  Service: Endoscopy;;  cold snare cecal, Hepatic flexure, Splenic flexure, and sigmoid polyps  . SUPRACERVICAL ABDOMINAL HYSTERECTOMY N/A 08/04/2017   Procedure: HYSTERECTOMY SUPRACERVICAL ABDOMINAL;  Surgeon: Jonnie Kind, MD;  Location: [facility];  Service: Gynecology;  Laterality: N/A;  . TUBAL LIGATION    . UMBILICAL HERNIA REPAIR  07/01/2011   Procedure: HERNIA REPAIR UMBILICAL ADULT;  Surgeon: Jonnie Kind, MD;  Location: [facility];  Service: Gynecology;  Laterality: N/A;  during Laparoscopic Bilateral Tubal Sterilization  . VENTRAL HERNIA REPAIR N/A 08/04/2017   Procedure: HERNIA REPAIR VENTRAL ADULT;  Surgeon: Jonnie Kind, MD;  Location: [facility];  Service: Gynecology;  Laterality: N/A;    Social History   Socioeconomic History  . Marital status: Unknown    Spouse name: Not on file  . Number of children: Not on file  . Years of education: Not on file  . Highest education level: Not on file  Occupational History  . Not on file  Tobacco Use  . Smoking status: Never Smoker  . Smokeless tobacco: Never Used  Substance and Sexual Activity  . Alcohol use: No    Alcohol/week: 0.0 standard drinks  . Drug use: No  . Sexual activity: Not Currently     Birth control/protection: Surgical    Comment: supracervical hyst  Other Topics Concern  . Not on file  Social History Narrative  . Not on file   Social Determinants of Health   Financial Resource Strain:   . Difficulty of Paying Living Expenses: Not on file  Food Insecurity:   . Worried About Charity fundraiser in the Last Year: Not on file  . Ran Out of Food in the Last Year: Not on file  Transportation Needs:   . Lack of Transportation (Medical): Not on file  . Lack of Transportation (Non-Medical): Not on file  Physical Activity:   . Days of Exercise per Week: Not on file  . Minutes of Exercise per Session: Not on file  Stress:   . Feeling of Stress : Not on file  Social Connections:   . Frequency of Communication with Friends and Family: Not on file  . Frequency of Social Gatherings with Friends and Family: Not on file  . Attends Religious Services: Not on file  . Active Member  of Clubs or Organizations: Not on file  . Attends Archivist Meetings: Not on file  . Marital Status: Not on file  Intimate Partner Violence:   . Fear of Current or Ex-Partner: Not on file  . Emotionally Abused: Not on file  . Physically Abused: Not on file  . Sexually Abused: Not on file    Barbarann Ehlers RN was present throughout the entirety of the encounter.  Vitals:   04/12/19 1608  BP: (!) 150/96  Pulse: 72  Temp: 98.1 F (36.7 C)  SpO2: 97%  Weight: 291 lb (132 kg)  Height: 5\' 7"  (1.702 m)    Wt Readings from Last 3 Encounters:  04/12/19 291 lb (132 kg)  04/02/18 261 lb (118.4 kg)  10/23/17 263 lb 12.8 oz (119.7 kg)     PHYSICAL EXAM General: NAD HEENT: Normal. Neck: No JVD, no thyromegaly. Lungs: Clear to auscultation bilaterally with normal respiratory effort. CV: Regular rate and rhythm, normal S1/S2, no S3/S4, no murmur. No pretibial or periankle edema.  No carotid bruit.   Abdomen: Soft, nontender, no distention.  Neurologic: Alert and oriented.   Psych: Normal affect. Skin: Normal. Musculoskeletal: No gross deformities.      Labs: Lab Results  Component Value Date/Time   K 3.2 (L) 08/05/2017 06:00 AM   BUN 8 08/05/2017 06:00 AM   CREATININE 0.97 08/05/2017 06:00 AM   ALT 23 07/30/2017 11:05 AM   HGB 10.6 (L) 08/05/2017 06:00 AM     Lipids: Lab Results  Component Value Date/Time   LDLCALC 105 (H) 08/04/2012 11:47 AM   LDLDIRECT 125.9 11/01/2012 10:59 AM   CHOL 202 (H) 11/01/2012 10:59 AM   TRIG 165.0 (H) 11/01/2012 10:59 AM   HDL 47.00 11/01/2012 10:59 AM       ASSESSMENT AND PLAN:  1.  Coronary artery disease: History of one-vessel CABG in 2012.  Nuclear stress test was normal in November 2018.  Symptomatically stable.  Continue aspirin, atorvastatin, and Lopressor.  2.  Hypertension: BP is elevated in our office.  However, she checks it routinely at home and it runs in the 120/80 range.  No changes to therapy.  3.  Hyperlipidemia: I will obtain a copy of lipids from PCP.  Continue atorvastatin.    Disposition: Follow up 1 year   Kate Sable, M.D., F.A.C.C.

## 2019-04-25 ENCOUNTER — Telehealth: Payer: Self-pay

## 2019-04-25 MED ORDER — ATORVASTATIN CALCIUM 20 MG PO TABS: 20.0000 mg | ORAL_TABLET | Freq: Every day | ORAL | 3 refills | Status: DC

## 2019-04-25 NOTE — Telephone Encounter (Signed)
Pt declines to increase atorvastatin to 20 mg , states she talked to pcp and she was not taking meds right

## 2019-05-02 ENCOUNTER — Other Ambulatory Visit: Payer: Self-pay | Admitting: Cardiovascular Disease

## 2019-06-13 ENCOUNTER — Telehealth: Payer: Self-pay | Admitting: *Deleted

## 2019-06-13 NOTE — Telephone Encounter (Signed)
Pt called in stating she has a small knot at entrance of rectum so made appt for ov tomorrow.

## 2019-06-14 ENCOUNTER — Other Ambulatory Visit: Payer: Self-pay

## 2019-06-14 ENCOUNTER — Ambulatory Visit (INDEPENDENT_AMBULATORY_CARE_PROVIDER_SITE_OTHER): Payer: BC Managed Care – PPO | Admitting: Nurse Practitioner

## 2019-06-14 ENCOUNTER — Encounter: Payer: Self-pay | Admitting: Nurse Practitioner

## 2019-06-14 DIAGNOSIS — K649 Unspecified hemorrhoids: Secondary | ICD-10-CM | POA: Diagnosis not present

## 2019-06-14 DIAGNOSIS — K625 Hemorrhage of anus and rectum: Secondary | ICD-10-CM | POA: Diagnosis not present

## 2019-06-14 MED ORDER — HYDROCORTISONE (PERIANAL) 2.5 % EX CREA: 1.0000 "application " | TOPICAL_CREAM | Freq: Two times a day (BID) | CUTANEOUS | 2 refills | Status: DC | PRN

## 2019-06-14 NOTE — Assessment & Plan Note (Signed)
The patient noted 1 or 2 episodes of scant tissue hematochezia with a "bump" near her rectum with a history of known internal and external hemorrhoids.  Together we decided to hold off on rectal exam today given that she is just had a colonoscopy within the past few months.  I have made recommendations related to hemorrhoids, as per above, to include Anusol, MiraLAX as needed, limit toilet time, avoid straining.  We will have her follow-up in 6 to 8 weeks.  If she is no better we can proceed with a rectal exam and further recommendations.  I highly doubt bleeding polyp or colorectal cancer given her recent colonoscopy.

## 2019-06-14 NOTE — Progress Notes (Signed)
Referring Provider: Rosita Fire, MD Primary Care Physician:  Rosita Fire, MD Primary GI:  Dr. Oneida Alar  Chief Complaint  Patient presents with  . knot at rectum    HPI:   Ashlee Stein is a 54 y.o. female who presents with concerns of "and not at the entrance of my rectum that sometimes bleeds."  The patient has never been seen by our office, but has been seen in endoscopy.  Colonoscopy recently updated 02/23/2019 which found three 2 to 4 mm polyps in the splenic flexure and hepatic flexure as well as cecum, a single 3 mm polyp in the sigmoid colon, external and internal hemorrhoids, tortuous colon.  Surgical pathology found the polyps to be tubular adenoma in the cecum, hepatic flexure, splenic flexure and hyperplastic in the sigmoid colon.  Recommended high-fiber diet, continue current medications, repeat colonoscopy in 5 to 7 years (2025 - 2027).  CBC scanned into the system 04/01/2019 with normal hemoglobin at 13.3.  Today she states she's doing ok overall. She states she has a spot just outside her rectum that was painful and had some bleeding on the toilet tissue. Is wondering if it's a hemorrhoid or not. Notes a history of constipation after CABG, but has not been constipated recently. Tried to eat a lot of fruits and vegetables. When she had the bleeding and enlarged "knott" area, was straining around that time. Denies abdominal pain, N/V, melena, fever, chills, unintentional weight loss. Denies URI or flu-like symptoms. Denies loss of sense of taste or smell. She has a J&J COVID-19 vaccine.   Past Medical History:  Diagnosis Date  . Angina   . Asthma    "as a child"  . Coronary artery dissection    NSTEMI 8/12: cath with mid to dist LAD spont. dissection, unable to do PCI and referred for CABG:  SVG to LAD (Dr. PVT);  Carotid U/S 6/14:  Normal carotid arteries; FMD could not be ruled out  . Coronary artery dissection 10/24/2010  . Dyslipidemia   . GERD (gastroesophageal reflux  disease)   . Heart disease   . Hypertension   . Iron deficiency anemia   . Obesity     Past Surgical History:  Procedure Laterality Date  . BILATERAL SALPINGECTOMY Bilateral 08/04/2017   Procedure: BILATERAL SALPINGECTOMY;  Surgeon: Jonnie Kind, MD;  Location: [facility];  Service: Gynecology;  Laterality: Bilateral;  . CARDIAC SURGERY     open heart surgery  . CERVICAL POLYPECTOMY  07/01/2011   Procedure: CERVICAL POLYPECTOMY;  Surgeon: Jonnie Kind, MD;  Location: [facility];  Service: Gynecology;  Laterality: N/A;  Endometrial Polypectomy  . COLONOSCOPY N/A 02/23/2019   Procedure: COLONOSCOPY;  Surgeon: Danie Binder, MD;  [facility];  Service: Endoscopy;  Laterality: N/A;  8:30  . CORONARY ARTERY BYPASS GRAFT  10/24/2010   CABG X1:  coronary artery dissection -->CABG:  SVG to LAD (Dr. PVT)  . DILATION AND CURETTAGE OF UTERUS    . HYSTEROSCOPY W/ ENDOMETRIAL ABLATION    . LAPAROSCOPIC TUBAL LIGATION  07/01/2011   Procedure: LAPAROSCOPIC TUBAL LIGATION;  Surgeon: Jonnie Kind, MD;  Location: [facility];  Service: Gynecology;  Laterality: Bilateral;  Laparoscopic Bilateral Tubal Sterilization with Fallope Rings; ended at 1143  . POLYPECTOMY  02/23/2019   Procedure: POLYPECTOMY;  Surgeon: Danie Binder, MD;  [facility];  Service: Endoscopy;;  cold snare cecal, Hepatic flexure, Splenic flexure, and sigmoid polyps  . SUPRACERVICAL ABDOMINAL HYSTERECTOMY N/A 08/04/2017  Procedure: HYSTERECTOMY SUPRACERVICAL ABDOMINAL;  Surgeon: Jonnie Kind, MD;  Location: [facility];  Service: Gynecology;  Laterality: N/A;  . TUBAL LIGATION    . UMBILICAL HERNIA REPAIR  07/01/2011   Procedure: HERNIA REPAIR UMBILICAL ADULT;  Surgeon: Jonnie Kind, MD;  Location: [facility];  Service: Gynecology;  Laterality: N/A;  during Laparoscopic Bilateral Tubal Sterilization  . VENTRAL HERNIA REPAIR N/A 08/04/2017   Procedure: HERNIA REPAIR VENTRAL ADULT;  Surgeon: Jonnie Kind, MD;  Location:  [facility];  Service: Gynecology;  Laterality: N/A;    Current Outpatient Medications  Medication Sig Dispense Refill  . aspirin EC 81 MG tablet Take 1 tablet (81 mg total) by mouth daily.    Marland Kitchen atorvastatin (LIPITOR) 20 MG tablet TAKE 1 TABLET DAILY 90 tablet 3  . latanoprost (XALATAN) 0.005 % ophthalmic solution Place 1 drop into both eyes at bedtime.  3  . lisinopril-hydrochlorothiazide (PRINZIDE,ZESTORETIC) 20-12.5 MG tablet Take 1 tablet by mouth 2 (two) times daily.   3  . metoprolol tartrate (LOPRESSOR) 25 MG tablet TAKE 1 & 1/2 TABLETS BY MOUTH TWICE A DAY (Patient taking differently: Take 37.5 mg by mouth 2 (two) times daily. ) 270 tablet 3  . Multiple Vitamin (MULITIVITAMIN WITH MINERALS) TABS Take 1 tablet by mouth daily.     Marland Kitchen omeprazole (PRILOSEC) 40 MG capsule Take 40 mg by mouth daily as needed (reflux).   3  . polyethylene glycol powder (MIRALAX) powder Take 17 g by mouth daily. To prevent constipation (Patient taking differently: Take 17 g by mouth daily as needed for mild constipation. To prevent constipation) 255 g prn   No current facility-administered medications for this visit.    Allergies as of 06/14/2019 - Review Complete 06/14/2019  Allergen Reaction Noted  . Morphine and related Rash 06/20/2011  . Penicillins Rash 06/20/2011    Family History  Problem Relation Age of Onset  . Heart attack Sister   . Diabetes Mother   . Hypertension Mother   . Heart disease Mother   . Cancer Brother 50       prostate  . Diabetes Sister   . Hypertension Sister   . Hypertension Sister   . Colon cancer Neg Hx     Social History   Socioeconomic History  . Marital status: Unknown    Spouse name: Not on file  . Number of children: Not on file  . Years of education: Not on file  . Highest education level: Not on file  Occupational History  . Not on file  Tobacco Use  . Smoking status: Never Smoker  . Smokeless tobacco: Never Used  Substance and Sexual Activity  .  Alcohol use: No    Alcohol/week: 0.0 standard drinks  . Drug use: No  . Sexual activity: Not Currently    Birth control/protection: Surgical    Comment: supracervical hyst  Other Topics Concern  . Not on file  Social History Narrative  . Not on file   Social Determinants of Health   Financial Resource Strain:   . Difficulty of Paying Living Expenses:   Food Insecurity:   . Worried About Charity fundraiser in the Last Year:   . Arboriculturist in the Last Year:   Transportation Needs:   . Film/video editor (Medical):   Marland Kitchen Lack of Transportation (Non-Medical):   Physical Activity:   . Days of Exercise per Week:   . Minutes of Exercise per Session:   Stress:   .  Feeling of Stress :   Social Connections:   . Frequency of Communication with Friends and Family:   . Frequency of Social Gatherings with Friends and Family:   . Attends Religious Services:   . Active Member of Clubs or Organizations:   . Attends Archivist Meetings:   Marland Kitchen Marital Status:     Subjective: Review of Systems  Constitutional: Negative for chills, fever, malaise/fatigue and weight loss.  HENT: Negative for congestion and sore throat.   Respiratory: Negative for cough and shortness of breath.   Cardiovascular: Negative for chest pain and palpitations.  Gastrointestinal: Positive for blood in stool (scant tissue hematochezia) and constipation (Not much, but was straining when she had rectal bleeding). Negative for abdominal pain, diarrhea, melena, nausea and vomiting.  Musculoskeletal: Negative for joint pain and myalgias.  Skin: Negative for rash.  Neurological: Negative for dizziness and weakness.  Endo/Heme/Allergies: Does not bruise/bleed easily.  Psychiatric/Behavioral: Negative for depression. The patient is not nervous/anxious.   All other systems reviewed and are negative.    Objective: BP (!) 162/107   Pulse 73   Temp (!) 96.2 F (35.7 C) (Temporal)   Ht 5' 7.5" (1.715 m)    Wt 293 lb 6.4 oz (133.1 kg)   LMP 07/23/2017 (Approximate)   BMI 45.27 kg/m  Physical Exam Vitals and nursing note reviewed.  Constitutional:      General: She is not in acute distress.    Appearance: Normal appearance. She is well-developed. She is obese. She is not ill-appearing, toxic-appearing or diaphoretic.  HENT:     Head: Normocephalic and atraumatic.     Nose: No congestion or rhinorrhea.  Eyes:     General: No scleral icterus. Cardiovascular:     Rate and Rhythm: Normal rate and regular rhythm.     Heart sounds: Normal heart sounds.  Pulmonary:     Effort: Pulmonary effort is normal. No respiratory distress.     Breath sounds: Normal breath sounds.  Abdominal:     General: Bowel sounds are normal.     Palpations: Abdomen is soft. There is no hepatomegaly, splenomegaly or mass.     Tenderness: There is no abdominal tenderness. There is no guarding or rebound.     Hernia: No hernia is present.  Skin:    General: Skin is warm and dry.     Coloration: Skin is not jaundiced.     Findings: No rash.  Neurological:     General: No focal deficit present.     Mental Status: She is alert and oriented to person, place, and time.  Psychiatric:        Attention and Perception: Attention normal.        Mood and Affect: Mood normal.        Speech: Speech normal.        Behavior: Behavior normal.        Thought Content: Thought content normal.        Cognition and Memory: Cognition and memory normal.       06/14/2019 3:52 PM   Disclaimer: This note was dictated with voice recognition software. Similar sounding words can inadvertently be transcribed and may not be corrected upon review.

## 2019-06-14 NOTE — Patient Instructions (Signed)
Your health issues we discussed today were:   "Bump" near your rectum with occasional bleeding, with known hemorrhoids: 1. As we discussed, I feel your symptoms are likely due to the internal and or external hemorrhoids that we know you have 2. Have sent Anusol rectal cream to your pharmacy.  You can use this up to twice a day for up to 10 days at a time for rectal discomfort and/or bleeding 3. Continue to use MiraLAX as needed to prevent constipation 4. Limit toilet time to 5 minutes 5. Try to avoid straining 6. Call us for any worsening or severe bleeding  Overall I recommend:  1. Continue your other current medications 2. Return for follow-up in 6 to 8 weeks 3. Call us if you have any questions or concerns.   ---------------------------------------------------------------  I am glad you received your COVID-19 vaccine excavation point even though you are fully vaccinated you should continue to wear a mask, socially distance, and wash your hands frequently.  ---------------------------------------------------------------   At [facility] we value your feedback. You may receive a survey about your visit today. Please share your experience as we strive to create trusting relationships with our patients to provide genuine, compassionate, quality care.  We appreciate your understanding and patience as we review any laboratory studies, imaging, and other diagnostic tests that are ordered as we care for you. Our office policy is 5 business days for review of these results, and any emergent or urgent results are addressed in a timely manner for your best interest. If you do not hear from our office in 1 week, please contact us.   We also encourage the use of MyChart, which contains your medical information for your review as well. If you are not enrolled in this feature, an access code is on this after visit summary for your convenience. Thank you for allowing [place] to be involved  in your care.  It was great to see you today!  I hope you have a great Summer!!

## 2019-06-14 NOTE — Assessment & Plan Note (Signed)
Known internal and external hemorrhoids on colonoscopy a few months ago.  She has complaints of "a bump" near her rectum with 1 or 2 episodes of scant tissue hematochezia around the time that she was having a little bit more straining than usual.  No overt, persistent constipation on MiraLAX.  Recommend she continue to use MiraLAX as needed to prevent constipation.  I recommended that she limit toilet time for 5 minutes, avoid straining.  I will send in a prescription for Anusol rectal cream that she can use twice a day for up to 10 days at a time.  Recommend follow-up in 6 to 8 weeks and if no improvement we can proceed with a rectal exam to further evaluate.  I doubt more insidious pathology such as colorectal cancer or bleeding polyp given her recent colonoscopy.  Most likely we are dealing with a benign anorectal source.  If rectal creams or not helping her situation we can consider hemorrhoid banding for internal hemorrhoids versus surgical referral for external hemorrhoidectomy.

## 2019-06-15 ENCOUNTER — Encounter: Payer: Self-pay | Admitting: Gastroenterology

## 2019-08-12 DIAGNOSIS — I2510 Atherosclerotic heart disease of native coronary artery without angina pectoris: Secondary | ICD-10-CM | POA: Diagnosis not present

## 2019-08-12 DIAGNOSIS — K219 Gastro-esophageal reflux disease without esophagitis: Secondary | ICD-10-CM | POA: Diagnosis not present

## 2019-08-12 DIAGNOSIS — I10 Essential (primary) hypertension: Secondary | ICD-10-CM | POA: Diagnosis not present

## 2019-08-30 ENCOUNTER — Ambulatory Visit: Payer: BC Managed Care – PPO | Admitting: Nurse Practitioner

## 2019-10-06 ENCOUNTER — Ambulatory Visit: Payer: BC Managed Care – PPO | Admitting: Obstetrics and Gynecology

## 2019-10-26 ENCOUNTER — Other Ambulatory Visit (HOSPITAL_COMMUNITY): Payer: Self-pay | Admitting: Internal Medicine

## 2019-10-26 DIAGNOSIS — J029 Acute pharyngitis, unspecified: Secondary | ICD-10-CM | POA: Diagnosis not present

## 2019-10-26 DIAGNOSIS — J209 Acute bronchitis, unspecified: Secondary | ICD-10-CM | POA: Diagnosis not present

## 2019-10-26 DIAGNOSIS — Z1231 Encounter for screening mammogram for malignant neoplasm of breast: Secondary | ICD-10-CM

## 2019-10-26 DIAGNOSIS — I10 Essential (primary) hypertension: Secondary | ICD-10-CM | POA: Diagnosis not present

## 2019-11-01 DIAGNOSIS — K219 Gastro-esophageal reflux disease without esophagitis: Secondary | ICD-10-CM | POA: Diagnosis not present

## 2019-11-01 DIAGNOSIS — I10 Essential (primary) hypertension: Secondary | ICD-10-CM | POA: Diagnosis not present

## 2019-11-01 DIAGNOSIS — Z0001 Encounter for general adult medical examination with abnormal findings: Secondary | ICD-10-CM | POA: Diagnosis not present

## 2019-11-01 DIAGNOSIS — I2510 Atherosclerotic heart disease of native coronary artery without angina pectoris: Secondary | ICD-10-CM | POA: Diagnosis not present

## 2019-11-07 ENCOUNTER — Ambulatory Visit (HOSPITAL_COMMUNITY)
Admission: RE | Admit: 2019-11-07 | Discharge: 2019-11-07 | Disposition: A | Discharge status: Home / Self Care | Payer: BC Managed Care – PPO | Source: Ambulatory Visit | Attending: Internal Medicine | Admitting: Internal Medicine

## 2019-11-07 ENCOUNTER — Other Ambulatory Visit: Payer: Self-pay

## 2019-11-07 DIAGNOSIS — Z1231 Encounter for screening mammogram for malignant neoplasm of breast: Secondary | ICD-10-CM

## 2020-02-27 ENCOUNTER — Other Ambulatory Visit: Payer: Self-pay

## 2020-02-27 ENCOUNTER — Ambulatory Visit
Admission: EM | Admit: 2020-02-27 | Discharge: 2020-02-27 | Disposition: A | Discharge status: Home / Self Care | Payer: BC Managed Care – PPO | Attending: Sports Medicine | Admitting: Sports Medicine

## 2020-02-27 DIAGNOSIS — Z0189 Encounter for other specified special examinations: Secondary | ICD-10-CM | POA: Diagnosis not present

## 2020-02-27 DIAGNOSIS — Z20822 Contact with and (suspected) exposure to covid-19: Secondary | ICD-10-CM | POA: Insufficient documentation

## 2020-02-27 NOTE — ED Triage Notes (Signed)
Pt was exposed to  2 twice over the last week. She is not having symptoms.

## 2020-02-27 NOTE — Discharge Instructions (Signed)

## 2020-02-28 LAB — SARS CORONAVIRUS 2 (TAT 6-24 HRS): SARS Coronavirus 2: NEGATIVE

## 2020-03-05 DIAGNOSIS — Z0001 Encounter for general adult medical examination with abnormal findings: Secondary | ICD-10-CM | POA: Diagnosis not present

## 2020-03-05 DIAGNOSIS — K219 Gastro-esophageal reflux disease without esophagitis: Secondary | ICD-10-CM | POA: Diagnosis not present

## 2020-03-05 DIAGNOSIS — R739 Hyperglycemia, unspecified: Secondary | ICD-10-CM | POA: Diagnosis not present

## 2020-03-05 DIAGNOSIS — I2510 Atherosclerotic heart disease of native coronary artery without angina pectoris: Secondary | ICD-10-CM | POA: Diagnosis not present

## 2020-03-05 DIAGNOSIS — Z79899 Other long term (current) drug therapy: Secondary | ICD-10-CM | POA: Diagnosis not present

## 2020-03-05 DIAGNOSIS — I10 Essential (primary) hypertension: Secondary | ICD-10-CM | POA: Diagnosis not present

## 2020-04-03 ENCOUNTER — Encounter: Payer: Self-pay | Admitting: Adult Health

## 2020-04-03 ENCOUNTER — Other Ambulatory Visit: Payer: Self-pay

## 2020-04-03 ENCOUNTER — Other Ambulatory Visit (HOSPITAL_COMMUNITY)
Admission: RE | Admit: 2020-04-03 | Discharge: 2020-04-03 | Disposition: A | Discharge status: Home / Self Care | Payer: BC Managed Care – PPO | Source: Ambulatory Visit | Attending: Adult Health | Admitting: Adult Health

## 2020-04-03 ENCOUNTER — Ambulatory Visit (INDEPENDENT_AMBULATORY_CARE_PROVIDER_SITE_OTHER): Payer: BC Managed Care – PPO | Admitting: Adult Health

## 2020-04-03 VITALS — BP 122/76 | HR 69 | Ht 67.0 in | Wt 285.0 lb

## 2020-04-03 DIAGNOSIS — Z1211 Encounter for screening for malignant neoplasm of colon: Secondary | ICD-10-CM | POA: Diagnosis not present

## 2020-04-03 DIAGNOSIS — K439 Ventral hernia without obstruction or gangrene: Secondary | ICD-10-CM | POA: Diagnosis not present

## 2020-04-03 DIAGNOSIS — Z01419 Encounter for gynecological examination (general) (routine) without abnormal findings: Secondary | ICD-10-CM | POA: Diagnosis not present

## 2020-04-03 DIAGNOSIS — L80 Vitiligo: Secondary | ICD-10-CM | POA: Diagnosis not present

## 2020-04-03 DIAGNOSIS — K429 Umbilical hernia without obstruction or gangrene: Secondary | ICD-10-CM

## 2020-04-03 LAB — HEMOCCULT GUIAC POC 1CARD (OFFICE): Fecal Occult Blood, POC: NEGATIVE

## 2020-04-03 NOTE — Progress Notes (Signed)
Patient ID: Ashlee Stein, female   DOB: 1965/09/29, 55 y.o.   MRN: [id number] History of Present Illness: Ashlee Stein is a 55 year old black female, sp [facility] in 2019 and had ventral hernia repair then too, and she thinks it is back. She sees [facility]  PCP is Dr Legrand Rams   Current Medications, Allergies, Past Medical History, Past Surgical History, Family History and Social History were reviewed in [employer] record.     Review of Systems: Patient denies any headaches, hearing loss, fatigue, blurred vision, shortness of breath, chest pain, abdominal pain, problems with bowel movements, urination, or intercourse. No joint pain or mood swings. Has ?recurrent hernia    Physical Exam:BP 122/76 (BP Location: Left Arm, Patient Position: Sitting, Cuff Size: Large)   Pulse 69   Ht 5\' 7"  (1.702 m)   Wt 285 lb (129.3 kg)   LMP 07/23/2017 (Approximate) Comment: [place]  BMI 44.64 kg/m  General:  Well developed, well nourished, no acute distress Skin:  Warm and dry Neck:  Midline trachea, normal thyroid, good ROM, no lymphadenopathy Lungs; Clear to auscultation bilaterally Breast:  No dominant palpable mass, retraction, or nipple discharge Cardiovascular: Regular rate and rhythm Abdomen:  Soft, non tender, no hepatosplenomegaly,has 3-4 cm ventral hernia, and small umbilical hernia. Pelvic:  External genitalia is normal in appearance,has vitiligo.  The vagina is normal in appearance. Urethra has no lesions or masses. The cervix is smooth, pap with GC/CHL and HRHPV performed.  Uterus is absent.  No adnexal masses or tenderness noted.Bladder is non tender, no masses felt. Rectal: Good sphincter tone, no polyps, or hemorrhoids felt.  Hemoccult negative. Extremities/musculoskeletal:  No swelling or varicosities noted, no clubbing or cyanosis Psych:  No mood changes, alert and cooperative,seems happy AA is 0 Fall risk is low PHQ 9 score is 0 GAD 7 score is 0  Upstream - 04/03/20  1607      Pregnancy Intention Screening   Does the patient want to become pregnant in the next year? N/A    Does the patient's partner want to become pregnant in the next year? N/A    Would the patient like to discuss contraceptive options today? N/A      Contraception Wrap Up   Current Method Female Sterilization   [facility]   End Method Female Sterilization   [facility]   Contraception Counseling Provided No         Examination chaperoned by Tinnie Gens NP student who assisted with exam.  Impression and Plan: 1. Encounter for gynecological examination with Papanicolaou smear of cervix Pap sent Physical in 1 year Mammogram yearly Labs per cardiology and PCP Colonoscopy per GI  2. Encounter for screening fecal occult blood testing  3. Vitiligo  4. Ventral hernia without obstruction or gangrene Lamar Benes loss may help some but if any pain go to ER, she declines surgical referral at this time   5. Umbilical hernia without obstruction and without gangrene

## 2020-04-08 ENCOUNTER — Ambulatory Visit
Admission: EM | Admit: 2020-04-08 | Discharge: 2020-04-08 | Disposition: A | Discharge status: Home / Self Care | Payer: BC Managed Care – PPO | Attending: Emergency Medicine | Admitting: Emergency Medicine

## 2020-04-08 ENCOUNTER — Ambulatory Visit (INDEPENDENT_AMBULATORY_CARE_PROVIDER_SITE_OTHER): Payer: BC Managed Care – PPO

## 2020-04-08 ENCOUNTER — Encounter: Payer: Self-pay | Admitting: Emergency Medicine

## 2020-04-08 ENCOUNTER — Other Ambulatory Visit: Payer: Self-pay

## 2020-04-08 DIAGNOSIS — M7989 Other specified soft tissue disorders: Secondary | ICD-10-CM

## 2020-04-08 DIAGNOSIS — L03012 Cellulitis of left finger: Secondary | ICD-10-CM | POA: Diagnosis not present

## 2020-04-08 MED ORDER — PREDNISONE 10 MG (21) PO TBPK: ORAL_TABLET | Freq: Every day | ORAL | 0 refills | Status: DC

## 2020-04-08 MED ORDER — SULFAMETHOXAZOLE-TRIMETHOPRIM 800-160 MG PO TABS: 1.0000 | ORAL_TABLET | Freq: Two times a day (BID) | ORAL | 0 refills | Status: AC

## 2020-04-08 NOTE — ED Triage Notes (Signed)
Patient states that she was moving some broken glass yesterday.  Patient has redness, swelling and redness in her left thumb that started yesterday.  Patient denies injury or fall.  Patient not sure if she has any pieced of glass stuck in her finger.

## 2020-04-08 NOTE — ED Provider Notes (Signed)
[facility]    CSN: [id number] Arrival date & time: 04/08/20  1250      History   Chief Complaint Chief Complaint  Patient presents with  . Finger Injury    Left thumb    HPI Ashlee Stein is a 55 y.o. female.   HPI   55 year old female here for evaluation of redness, pain, and swelling to her left.  Patient reports that she was moving some broken grass from a drawer yesterday and does not remember poking herself or cutting herself with a broken glass.  Patient states that later in the day her thumb began to become red and swollen and hot.  Patient reports that last night she was able to flex her thumb down but now she cannot due to the pain and swelling.  Patient's thumb from her distal knuckle back to her palm is red, hot, tender, and swollen.  There is a very superficial laceration to the palm of her left hand that intersects the redness but no puncture wounds or lacerations noted in the area of redness. r.  Past Medical History:  Diagnosis Date  . Angina   . Asthma    "as a child"  . Coronary artery dissection    NSTEMI 8/12: cath with mid to dist LAD spont. dissection, unable to do PCI and referred for CABG:  SVG to LAD (Dr. PVT);  Carotid U/S 6/14:  Normal carotid arteries; FMD could not be ruled out  . Coronary artery dissection 10/24/2010  . Dyslipidemia   . GERD (gastroesophageal reflux disease)   . Heart disease   . Hypertension   . Iron deficiency anemia   . Obesity     Patient Active Problem List   Diagnosis Date Noted  . Encounter for gynecological examination with Papanicolaou smear of cervix 04/03/2020  . Encounter for screening fecal occult blood testing 04/03/2020  . Vitiligo 04/03/2020  . Umbilical hernia without obstruction and without gangrene 04/03/2020  . Ventral hernia without obstruction or gangrene 04/03/2020  . Hemorrhoids 06/14/2019  . Rectal bleeding 06/14/2019  . Special screening for malignant neoplasms, colon 10/25/2018  .  S/P abdominal supracervical subtotal hysterectomy 08/04/2017  . Intramural and submucous leiomyoma of uterus 06/03/2017  . Diastasis recti 03/13/2017  . Epigastric hernia 03/13/2017  . Morbid obesity ([place]) 08/24/2015  . Dyspareunia 08/29/2013  . Umbilical hernia 40/98/1191  . Menorrhagia 06/29/2011  . Anemia 01/07/2011  . Cough 12/06/2010  . Chest pain 11/14/2010  . Coronary artery dissection   . Dyslipidemia   . Hypertension     Past Surgical History:  Procedure Laterality Date  . BILATERAL SALPINGECTOMY Bilateral 08/04/2017   Procedure: BILATERAL SALPINGECTOMY;  Surgeon: Jonnie Kind, MD;  Location: [facility];  Service: Gynecology;  Laterality: Bilateral;  . CARDIAC SURGERY     open heart surgery  . CERVICAL POLYPECTOMY  07/01/2011   Procedure: CERVICAL POLYPECTOMY;  Surgeon: Jonnie Kind, MD;  Location: [facility];  Service: Gynecology;  Laterality: N/A;  Endometrial Polypectomy  . COLONOSCOPY N/A 02/23/2019   Procedure: COLONOSCOPY;  Surgeon: Danie Binder, MD;  Location: [facility];  Service: Endoscopy;  Laterality: N/A;  8:30  . CORONARY ARTERY BYPASS GRAFT  10/24/2010   CABG X1:  coronary artery dissection -->CABG:  SVG to LAD (Dr. PVT)  . DILATION AND CURETTAGE OF UTERUS    . HYSTEROSCOPY W/ ENDOMETRIAL ABLATION    . LAPAROSCOPIC TUBAL LIGATION  07/01/2011   Procedure: LAPAROSCOPIC TUBAL LIGATION;  Surgeon: Angelyn Punt  Glo Herring, MD;  Location: [facility];  Service: Gynecology;  Laterality: Bilateral;  Laparoscopic Bilateral Tubal Sterilization with Fallope Rings; ended at 1143  . POLYPECTOMY  02/23/2019   Procedure: POLYPECTOMY;  Surgeon: Danie Binder, MD;  Location: [facility];  Service: Endoscopy;;  cold snare cecal, Hepatic flexure, Splenic flexure, and sigmoid polyps  . SUPRACERVICAL ABDOMINAL HYSTERECTOMY N/A 08/04/2017   Procedure: HYSTERECTOMY SUPRACERVICAL ABDOMINAL;  Surgeon: Jonnie Kind, MD;  Location: [facility];  Service: Gynecology;  Laterality: N/A;  . TUBAL  LIGATION    . UMBILICAL HERNIA REPAIR  07/01/2011   Procedure: HERNIA REPAIR UMBILICAL ADULT;  Surgeon: Jonnie Kind, MD;  Location: [facility];  Service: Gynecology;  Laterality: N/A;  during Laparoscopic Bilateral Tubal Sterilization  . VENTRAL HERNIA REPAIR N/A 08/04/2017   Procedure: HERNIA REPAIR VENTRAL ADULT;  Surgeon: Jonnie Kind, MD;  Location: [facility];  Service: Gynecology;  Laterality: N/A;    OB History    Gravida  2   Para  2   Term  2   Preterm      AB      Living  2     SAB      IAB      Ectopic      Multiple      Live Births  2            Home Medications    Prior to Admission medications   Medication Sig Start Date End Date Taking? Authorizing Provider  amLODipine (NORVASC) 10 MG tablet Take 10 mg by mouth daily. 03/05/20  Yes [provider]  aspirin EC 81 MG tablet Take 1 tablet (81 mg total) by mouth daily. 12/06/10  Yes Hillary Bow, MD  atorvastatin (LIPITOR) 20 MG tablet TAKE 1 TABLET DAILY 05/02/19  Yes Herminio Commons, MD  latanoprost (XALATAN) 0.005 % ophthalmic solution Place 1 drop into both eyes at bedtime. 05/24/17  Yes [provider]  lisinopril-hydrochlorothiazide (PRINZIDE,ZESTORETIC) 20-12.5 MG tablet Take 1 tablet by mouth 2 (two) times daily.  01/14/15  Yes [provider]  metoprolol tartrate (LOPRESSOR) 25 MG tablet TAKE 1 & 1/2 TABLETS BY MOUTH TWICE A DAY Patient taking differently: Take 37.5 mg by mouth 2 (two) times daily. 09/20/15  Yes Herminio Commons, MD  Multiple Vitamin (MULITIVITAMIN WITH MINERALS) TABS Take 1 tablet by mouth daily.    Yes [provider]  predniSONE (STERAPRED UNI-PAK 21 TAB) 10 MG (21) TBPK tablet Take by mouth daily. Take 6 tabs by mouth daily  for 2 days, then 5 tabs for 2 days, then 4 tabs for 2 days, then 3 tabs for 2 days, 2 tabs for 2 days, then 1 tab by mouth daily for 2 days 04/08/20  Yes Margarette Canada, NP  sulfamethoxazole-trimethoprim (BACTRIM DS)  800-160 MG tablet Take 1 tablet by mouth 2 (two) times daily for 10 days. 04/08/20 04/18/20 Yes Margarette Canada, NP  hydrocortisone (ANUSOL-HC) 2.5 % rectal cream Place 1 application rectally 2 (two) times daily as needed for hemorrhoids (up to twice a day for up to 10 days at a time). 06/14/19   Carlis Stable, NP  omeprazole (PRILOSEC) 40 MG capsule Take 40 mg by mouth daily as needed (reflux).  09/03/16   [provider]  polyethylene glycol powder (MIRALAX) powder Take 17 g by mouth daily. To prevent constipation Patient taking differently: Take 17 g by mouth daily as needed for mild constipation. To prevent constipation 08/06/17  Jonnie Kind, MD    Family History Family History  Problem Relation Age of Onset  . Heart attack Sister   . Diabetes Mother   . Hypertension Mother   . Heart disease Mother   . Cancer Brother 50       prostate  . Diabetes Sister   . Hypertension Sister   . Hypertension Sister   . Colon cancer Neg Hx     Social History Social History   Tobacco Use  . Smoking status: Never Smoker  . Smokeless tobacco: Never Used  Vaping Use  . Vaping Use: Never used  Substance Use Topics  . Alcohol use: No    Alcohol/week: 0.0 standard drinks  . Drug use: No     Allergies   Morphine and related and Penicillins   Review of Systems Review of Systems  Constitutional: Negative for activity change, appetite change and fever.  Musculoskeletal: Positive for arthralgias, joint swelling and myalgias.  Skin: Positive for color change. Negative for rash and wound.  Neurological: Negative for weakness and numbness.  Hematological: Negative.   Psychiatric/Behavioral: Negative.      Physical Exam Triage Vital Signs ED Triage Vitals  Enc Vitals Group     BP 04/08/20 1304 119/78     Pulse Rate 04/08/20 1304 81     Resp 04/08/20 1304 14     Temp 04/08/20 1304 99.3 F (37.4 C)     Temp Source 04/08/20 1304 Oral     SpO2 04/08/20 1304 98 %     Weight  04/08/20 1259 250 lb (113.4 kg)     Height 04/08/20 1259 5\' 7"  (1.702 m)     Head Circumference --      Peak Flow --      Pain Score 04/08/20 1259 4     Pain Loc --      Pain Edu? --      Excl. in [place]? --    No data found.  Updated Vital Signs BP 119/78 (BP Location: Right Arm)   Pulse 81   Temp 99.3 F (37.4 C) (Oral)   Resp 14   Ht 5\' 7"  (1.702 m)   Wt 250 lb (113.4 kg)   LMP 07/23/2017 (Approximate) Comment: [place]  SpO2 98%   BMI 39.16 kg/m   Visual Acuity Right Eye Distance:   Left Eye Distance:   Bilateral Distance:    Right Eye Near:   Left Eye Near:    Bilateral Near:     Physical Exam Vitals and nursing note reviewed.  Constitutional:      General: She is not in acute distress.    Appearance: Normal appearance. She is not ill-appearing.  HENT:     Head: Normocephalic and atraumatic.  Musculoskeletal:        General: Swelling and tenderness present. No deformity or signs of injury.  Skin:    General: Skin is warm and dry.     Capillary Refill: Capillary refill takes less than 2 seconds.     Findings: Erythema present. No rash.  Neurological:     General: No focal deficit present.     Mental Status: She is alert and oriented to person, place, and time.  Psychiatric:        Mood and Affect: Mood normal.        Behavior: Behavior normal.        Thought Content: Thought content normal.        Judgment: Judgment normal.  UC Treatments / Results  Labs (all labs ordered are listed, but only abnormal results are displayed) Labs Reviewed - No data to display  EKG   Radiology DG Finger Thumb Left  Result Date: 04/08/2020 CLINICAL DATA:  Redness and swelling, concern for glass foreign body EXAM: LEFT THUMB 2+V COMPARISON:  None. FINDINGS: Mild soft tissue swelling. No visualized radiopaque foreign body. Normal alignment without acute osseous finding or fracture. No joint abnormality. IMPRESSION: No acute finding by plain radiography. Electronically  Signed   By: Jerilynn Mages.  Shick M.D.   On: 04/08/2020 13:44    Procedures Procedures (including critical care time)  Medications Ordered in UC Medications - No data to display  Initial Impression / Assessment and Plan / UC Course  I have reviewed the triage vital signs and the nursing notes.  Pertinent labs & imaging results that were available during my care of the patient were reviewed by me and considered in my medical decision making (see chart for details).   Patient is a very pleasant 55 year old female here for evaluation of redness, swelling, tenderness, and heat to her left thumb that started yesterday.  Physical exam reveals a erythematous and edematous left thumb.  The erythema and edema extend from the IP joint back through the MCP joint.  The IP and MCP joints are tender to palpation as is the tissue around the proximal phalanx of the thumb.  The distal phalanx of the thumb is nontender, nonerythematous, and nonswollen.  Cap refill is less than 2 seconds.  Patient does have some erythema to the thenar eminence of the left thumb.  There is no induration or tenderness over the thenar eminence.  No induration or fluctuance over the proximal phalanx of the left thumb.  There is a superficial scratch through the thenar eminence of the left thumb that extends into the area of redness.  There are no puncture wounds or lacerations within the area of redness.  There is also redness upon the dorsal aspect of the thumb that extends from the IP joint to the MCP joint.  Patient denies any history of gout.  Will obtain x-ray of left thumb to evaluate for possible retained foreign body.  X-ray of left arm independently interpreted by me.  Interpretation: The normal anatomical alignment is maintained.  Joint spaces appear preserved.  No obvious fractures or dislocations.  Soft tissue swelling present over the proximal phalanx.  Awaiting radiology overread.  Radiology overread coincides with my exam.  Suspect  patient has cellulitis versus gout.  We will treat patient for both with Bactrim and a steroid pack.   Final Clinical Impressions(s) / UC Diagnoses   Final diagnoses:  Cellulitis of thumb, left     Discharge Instructions     Take the Bactrim twice daily with a full glass of water for 10 days.  If the swelling and redness does not improve after 48 hours on the antibiotics start the steroid pack.  If the redness continues to spread, you develop red streaks going up your arm, or you start running a fever you need to go to the ER for evaluation.    ED Prescriptions    Medication Sig Dispense Auth. Provider   sulfamethoxazole-trimethoprim (BACTRIM DS) 800-160 MG tablet Take 1 tablet by mouth 2 (two) times daily for 10 days. 20 tablet Margarette Canada, NP   predniSONE (STERAPRED UNI-PAK 21 TAB) 10 MG (21) TBPK tablet Take by mouth daily. Take 6 tabs by mouth daily  for 2 days, then  5 tabs for 2 days, then 4 tabs for 2 days, then 3 tabs for 2 days, 2 tabs for 2 days, then 1 tab by mouth daily for 2 days 42 tablet Margarette Canada, NP     PDMP not reviewed this encounter.   Margarette Canada, NP 04/08/20 1355

## 2020-04-08 NOTE — Discharge Instructions (Addendum)
Take the Bactrim twice daily with a full glass of water for 10 days.  If the swelling and redness does not improve after 48 hours on the antibiotics start the steroid pack.  If the redness continues to spread, you develop red streaks going up your arm, or you start running a fever you need to go to the ER for evaluation.

## 2020-04-11 ENCOUNTER — Encounter: Payer: Self-pay | Admitting: Adult Health

## 2020-04-11 DIAGNOSIS — R87810 Cervical high risk human papillomavirus (HPV) DNA test positive: Secondary | ICD-10-CM

## 2020-04-11 DIAGNOSIS — I10 Essential (primary) hypertension: Secondary | ICD-10-CM | POA: Diagnosis not present

## 2020-04-11 DIAGNOSIS — M10041 Idiopathic gout, right hand: Secondary | ICD-10-CM | POA: Diagnosis not present

## 2020-04-11 DIAGNOSIS — M109 Gout, unspecified: Secondary | ICD-10-CM | POA: Diagnosis not present

## 2020-04-11 HISTORY — DX: Cervical high risk human papillomavirus (HPV) DNA test positive: R87.810

## 2020-04-11 LAB — CYTOLOGY - PAP
Adequacy: ABSENT
Chlamydia: NEGATIVE
Comment: NEGATIVE
Comment: NEGATIVE
Comment: NEGATIVE
Comment: NORMAL
Diagnosis: NEGATIVE
HPV 16: NEGATIVE
HPV 18 / 45: NEGATIVE
High risk HPV: POSITIVE — AB
Neisseria Gonorrhea: NEGATIVE

## 2020-04-13 NOTE — Progress Notes (Deleted)
Cardiologist:  Bronson Ing new to me   SUBJECTIVE:   55 y.o. HTN, CAD single vessel CABG 2012 with SVG to LAD EF has been normal Last myovue 01/09/17 normal  No ischemia EF 69%   The patient denies any symptoms of chest pain, palpitations, shortness of breath, lightheadedness, dizziness, leg swelling, orthopnea, PND, and syncope.  She is frustrated by weight gain during the pandemic but intends to lose it. Injured left thumb on glass 04/08/20 Rx for cellulitis and gout with bactrim and sterapred 21 pack  ***    SocHx:Married. Works doing Sales executive for [employer]. Lives in [place].Has 2 children.  Her husband works for [employer].  She has a 4 year old daughter who is completed her masters in Environmental health practitioner and is now working for the [employer]  She has a 8 year old son ***  Review of Systems: As per "subjective", otherwise negative.  Allergies  Allergen Reactions  . Morphine And Related Rash  . Penicillins Rash    Has patient had a PCN reaction causing immediate rash, facial/tongue/throat swelling, SOB or lightheadedness with hypotension: yes Has patient had a PCN reaction causing severe rash involving mucus membranes or skin necrosis: no Has patient had a PCN reaction that required hospitalization: no Has patient had a PCN reaction occurring within the last 10 years: no If all of the above answers are "NO", then may proceed with Cephalosporin use.     Current Outpatient Medications  Medication Sig Dispense Refill  . amLODipine (NORVASC) 10 MG tablet Take 10 mg by mouth daily.    Marland Kitchen aspirin EC 81 MG tablet Take 1 tablet (81 mg total) by mouth daily.    Marland Kitchen atorvastatin (LIPITOR) 20 MG tablet TAKE 1 TABLET DAILY 90 tablet 3  . hydrocortisone (ANUSOL-HC) 2.5 % rectal cream Place 1 application rectally 2 (two) times daily as needed for hemorrhoids (up to twice a day for up to 10 days at a time). 30 g 2  . latanoprost (XALATAN)  0.005 % ophthalmic solution Place 1 drop into both eyes at bedtime.  3  . lisinopril-hydrochlorothiazide (PRINZIDE,ZESTORETIC) 20-12.5 MG tablet Take 1 tablet by mouth 2 (two) times daily.   3  . metoprolol tartrate (LOPRESSOR) 25 MG tablet TAKE 1 & 1/2 TABLETS BY MOUTH TWICE A DAY (Patient taking differently: Take 37.5 mg by mouth 2 (two) times daily.) 270 tablet 3  . Multiple Vitamin (MULITIVITAMIN WITH MINERALS) TABS Take 1 tablet by mouth daily.     Marland Kitchen omeprazole (PRILOSEC) 40 MG capsule Take 40 mg by mouth daily as needed (reflux).   3  . polyethylene glycol powder (MIRALAX) powder Take 17 g by mouth daily. To prevent constipation (Patient taking differently: Take 17 g by mouth daily as needed for mild constipation. To prevent constipation) 255 g prn  . predniSONE (STERAPRED UNI-PAK 21 TAB) 10 MG (21) TBPK tablet Take by mouth daily. Take 6 tabs by mouth daily  for 2 days, then 5 tabs for 2 days, then 4 tabs for 2 days, then 3 tabs for 2 days, 2 tabs for 2 days, then 1 tab by mouth daily for 2 days 42 tablet 0  . sulfamethoxazole-trimethoprim (BACTRIM DS) 800-160 MG tablet Take 1 tablet by mouth 2 (two) times daily for 10 days. 20 tablet 0   No current facility-administered medications for this visit.    Past Medical History:  Diagnosis Date  . Angina   . Asthma    "as a child"  .  Coronary artery dissection    NSTEMI 8/12: cath with mid to dist LAD spont. dissection, unable to do PCI and referred for CABG:  SVG to LAD (Dr. PVT);  Carotid U/S 6/14:  Normal carotid arteries; FMD could not be ruled out  . Coronary artery dissection 10/24/2010  . Dyslipidemia   . GERD (gastroesophageal reflux disease)   . Heart disease   . Hypertension   . Iron deficiency anemia   . Obesity   . Papanicolaou smear of cervix with positive high risk human papilloma virus (HPV) test 04/11/2020   Pap negative but +HRHPV other, was negative for 16/18, repeat pap in 1 year per [facility] gudielines , 5 year CIN3+ risk  is 2.25%    Past Surgical History:  Procedure Laterality Date  . BILATERAL SALPINGECTOMY Bilateral 08/04/2017   Procedure: BILATERAL SALPINGECTOMY;  Surgeon: Jonnie Kind, MD;  Location: [facility];  Service: Gynecology;  Laterality: Bilateral;  . CARDIAC SURGERY     open heart surgery  . CERVICAL POLYPECTOMY  07/01/2011   Procedure: CERVICAL POLYPECTOMY;  Surgeon: Jonnie Kind, MD;  Location: [facility];  Service: Gynecology;  Laterality: N/A;  Endometrial Polypectomy  . COLONOSCOPY N/A 02/23/2019   Procedure: COLONOSCOPY;  Surgeon: Danie Binder, MD;  [facility];  Service: Endoscopy;  Laterality: N/A;  8:30  . CORONARY ARTERY BYPASS GRAFT  10/24/2010   CABG X1:  coronary artery dissection -->CABG:  SVG to LAD (Dr. PVT)  . DILATION AND CURETTAGE OF UTERUS    . HYSTEROSCOPY W/ ENDOMETRIAL ABLATION    . LAPAROSCOPIC TUBAL LIGATION  07/01/2011   Procedure: LAPAROSCOPIC TUBAL LIGATION;  Surgeon: Jonnie Kind, MD;  Location: [facility];  Service: Gynecology;  Laterality: Bilateral;  Laparoscopic Bilateral Tubal Sterilization with Fallope Rings; ended at 1143  . POLYPECTOMY  02/23/2019   Procedure: POLYPECTOMY;  Surgeon: Danie Binder, MD;  [facility];  Service: Endoscopy;;  cold snare cecal, Hepatic flexure, Splenic flexure, and sigmoid polyps  . SUPRACERVICAL ABDOMINAL HYSTERECTOMY N/A 08/04/2017   Procedure: HYSTERECTOMY SUPRACERVICAL ABDOMINAL;  Surgeon: Jonnie Kind, MD;  Location: [facility];  Service: Gynecology;  Laterality: N/A;  . TUBAL LIGATION    . UMBILICAL HERNIA REPAIR  07/01/2011   Procedure: HERNIA REPAIR UMBILICAL ADULT;  Surgeon: Jonnie Kind, MD;  Location: [facility];  Service: Gynecology;  Laterality: N/A;  during Laparoscopic Bilateral Tubal Sterilization  . VENTRAL HERNIA REPAIR N/A 08/04/2017   Procedure: HERNIA REPAIR VENTRAL ADULT;  Surgeon: Jonnie Kind, MD;  Location: [facility];  Service: Gynecology;  Laterality: N/A;    Social History    Socioeconomic History  . Marital status: Unknown    Spouse name: Not on file  . Number of children: Not on file  . Years of education: Not on file  . Highest education level: Not on file  Occupational History  . Not on file  Tobacco Use  . Smoking status: Never Smoker  . Smokeless tobacco: Never Used  Vaping Use  . Vaping Use: Never used  Substance and Sexual Activity  . Alcohol use: No    Alcohol/week: 0.0 standard drinks  . Drug use: No  . Sexual activity: Yes    Birth control/protection: Surgical    Comment: supracervical hyst  Other Topics Concern  . Not on file  Social History Narrative  . Not on file   Social Determinants of Health   Financial Resource Strain: Low Risk   . Difficulty of Paying Living  Expenses: Not very hard  Food Insecurity: No Food Insecurity  . Worried About Charity fundraiser in the Last Year: Never true  . Ran Out of Food in the Last Year: Never true  Transportation Needs: No Transportation Needs  . Lack of Transportation (Medical): No  . Lack of Transportation (Non-Medical): No  Physical Activity: Sufficiently Active  . Days of Exercise per Week: 4 days  . Minutes of Exercise per Session: 70 min  Stress: No Stress Concern Present  . Feeling of Stress : Not at all  Social Connections: Unknown  . Frequency of Communication with Friends and Family: More than three times a week  . Frequency of Social Gatherings with Friends and Family: Once a week  . Attends Religious Services: More than 4 times per year  . Active Member of Clubs or Organizations: Yes  . Attends Archivist Meetings: More than 4 times per year  . Marital Status: Patient refused  Intimate Partner Violence: Not At Risk  . Fear of Current or Ex-Partner: No  . Emotionally Abused: No  . Physically Abused: No  . Sexually Abused: No    Barbarann Ehlers RN was present throughout the entirety of the encounter.  There were no vitals filed for this visit.  Wt  Readings from Last 3 Encounters:  04/08/20 113.4 kg  04/03/20 129.3 kg  06/14/19 133.1 kg     PHYSICAL EXAM LMP 07/23/2017 (Approximate) Comment: [facility]  Affect appropriate Healthy:  appears stated age 61: normal Neck supple with no adenopathy JVP normal no bruits no thyromegaly Lungs clear with no wheezing and good diaphragmatic motion Heart:  S1/S2 no murmur, no rub, gallop or click PMI normal Abdomen: benighn, BS positve, no tenderness, no AAA no bruit.  No HSM or HJR Distal pulses intact with no bruits No edema Neuro non-focal Skin warm and dry No muscular weakness   Labs: Lab Results  Component Value Date/Time   K 3.2 (L) 08/05/2017 06:00 AM   BUN 8 08/05/2017 06:00 AM   CREATININE 0.97 08/05/2017 06:00 AM   ALT 23 07/30/2017 11:05 AM   HGB 10.6 (L) 08/05/2017 06:00 AM     Lipids: Lab Results  Component Value Date/Time   LDLCALC 105 (H) 08/04/2012 11:47 AM   LDLDIRECT 125.9 11/01/2012 10:59 AM   CHOL 202 (H) 11/01/2012 10:59 AM   TRIG 165.0 (H) 11/01/2012 10:59 AM   HDL 47.00 11/01/2012 10:59 AM       ASSESSMENT AND PLAN:  1.  Coronary artery disease: History of one-vessel CABG in 2012.  Nuclear stress test was normal in November 2018.  Symptomatically stable.  Continue aspirin, atorvastatin, and Lopressor.  2.  Hypertension: Well controlled.  Continue current medications and low sodium Dash type diet.    3.  Hyperlipidemia:continue statin labs with primary     Disposition: Follow up 1 year   Jenkins Rouge MD [facility]

## 2020-04-16 ENCOUNTER — Ambulatory Visit: Payer: BC Managed Care – PPO | Admitting: Cardiovascular Disease

## 2020-06-26 ENCOUNTER — Telehealth: Payer: Self-pay | Admitting: Adult Health

## 2020-06-26 NOTE — Telephone Encounter (Signed)
Patient called stating that she has had a pap and physical done not to long ago with Anderson Malta and she would like to know the results. Please contact pt

## 2020-06-27 ENCOUNTER — Encounter: Payer: Self-pay | Admitting: *Deleted

## 2020-06-27 NOTE — Telephone Encounter (Signed)
Left message @ 10:24 am. Will also send a MYChart message. [place]

## 2020-06-27 NOTE — Telephone Encounter (Signed)
Pt aware of pap results. Pt has some vaginal irritation and is wanting to be checked. I advised she could come in for a self swab and pt agreed. Call was transferred to [facility] for appt. [place]

## 2020-06-29 ENCOUNTER — Other Ambulatory Visit: Payer: BC Managed Care – PPO

## 2020-07-31 DIAGNOSIS — H25013 Cortical age-related cataract, bilateral: Secondary | ICD-10-CM | POA: Diagnosis not present

## 2020-07-31 DIAGNOSIS — H5203 Hypermetropia, bilateral: Secondary | ICD-10-CM | POA: Diagnosis not present

## 2020-07-31 DIAGNOSIS — H52221 Regular astigmatism, right eye: Secondary | ICD-10-CM | POA: Diagnosis not present

## 2020-07-31 DIAGNOSIS — H524 Presbyopia: Secondary | ICD-10-CM | POA: Diagnosis not present

## 2020-07-31 DIAGNOSIS — H401131 Primary open-angle glaucoma, bilateral, mild stage: Secondary | ICD-10-CM | POA: Diagnosis not present

## 2020-09-06 DIAGNOSIS — H401131 Primary open-angle glaucoma, bilateral, mild stage: Secondary | ICD-10-CM | POA: Diagnosis not present

## 2020-09-06 DIAGNOSIS — H53413 Scotoma involving central area, bilateral: Secondary | ICD-10-CM | POA: Diagnosis not present

## 2020-10-19 DIAGNOSIS — Z20822 Contact with and (suspected) exposure to covid-19: Secondary | ICD-10-CM | POA: Diagnosis not present

## 2020-10-22 DIAGNOSIS — I10 Essential (primary) hypertension: Secondary | ICD-10-CM | POA: Diagnosis not present

## 2020-10-22 DIAGNOSIS — J029 Acute pharyngitis, unspecified: Secondary | ICD-10-CM | POA: Diagnosis not present

## 2020-10-22 DIAGNOSIS — I2510 Atherosclerotic heart disease of native coronary artery without angina pectoris: Secondary | ICD-10-CM | POA: Diagnosis not present

## 2020-10-31 ENCOUNTER — Other Ambulatory Visit (HOSPITAL_COMMUNITY): Payer: Self-pay | Admitting: Internal Medicine

## 2020-10-31 DIAGNOSIS — Z1231 Encounter for screening mammogram for malignant neoplasm of breast: Secondary | ICD-10-CM

## 2020-11-15 ENCOUNTER — Ambulatory Visit (HOSPITAL_COMMUNITY)
Admission: RE | Admit: 2020-11-15 | Discharge: 2020-11-15 | Disposition: A | Discharge status: Home / Self Care | Payer: BC Managed Care – PPO | Source: Ambulatory Visit | Attending: Internal Medicine | Admitting: Internal Medicine

## 2020-11-15 ENCOUNTER — Other Ambulatory Visit: Payer: Self-pay

## 2020-11-15 DIAGNOSIS — Z1231 Encounter for screening mammogram for malignant neoplasm of breast: Secondary | ICD-10-CM | POA: Diagnosis not present

## 2020-11-19 ENCOUNTER — Other Ambulatory Visit (HOSPITAL_COMMUNITY): Payer: Self-pay | Admitting: Internal Medicine

## 2020-11-19 DIAGNOSIS — M25571 Pain in right ankle and joints of right foot: Secondary | ICD-10-CM

## 2020-11-20 ENCOUNTER — Other Ambulatory Visit: Payer: Self-pay

## 2020-11-20 ENCOUNTER — Ambulatory Visit (HOSPITAL_COMMUNITY)
Admission: RE | Admit: 2020-11-20 | Discharge: 2020-11-20 | Disposition: A | Discharge status: Home / Self Care | Payer: BC Managed Care – PPO | Source: Ambulatory Visit | Attending: Internal Medicine | Admitting: Internal Medicine

## 2020-11-20 DIAGNOSIS — M25571 Pain in right ankle and joints of right foot: Secondary | ICD-10-CM

## 2020-11-27 ENCOUNTER — Ambulatory Visit: Payer: BC Managed Care – PPO | Admitting: Cardiovascular Disease

## 2021-02-02 NOTE — Progress Notes (Incomplete)
HPI:55 y.o. new to me Previously seen by Dr Bronson Ing.  History of single vessel CABG in 2012 with SVG to LAD. Has normal EF and last myovue 01/09/17 normal with EF 69% She has HTN which is controlled Gained some weight during pandemic   ***  Soc Hx: Married. 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 Has 2 children.  Her husband works for [employer].  She has a 19 year old daughter who is completed her masters in Environmental health practitioner and is now working for the [employer]  She has a 1 year-old son who is a Equities trader in high school in [place] and taking advanced classes.  Review of Systems: As per "subjective", otherwise negative.  Allergies  Allergen Reactions   Morphine And Related Rash   Penicillins Rash    Has patient had a PCN reaction causing immediate rash, facial/tongue/throat swelling, SOB or lightheadedness with hypotension: yes Has patient had a PCN reaction causing severe rash involving mucus membranes or skin necrosis: no Has patient had a PCN reaction that required hospitalization: no Has patient had a PCN reaction occurring within the last 10 years: no If all of the above answers are "NO", then may proceed with Cephalosporin use.     Current Outpatient Medications  Medication Sig Dispense Refill   amLODipine (NORVASC) 10 MG tablet Take 10 mg by mouth daily.     aspirin EC 81 MG tablet Take 1 tablet (81 mg total) by mouth daily.     atorvastatin (LIPITOR) 20 MG tablet TAKE 1 TABLET DAILY 90 tablet 3   hydrocortisone (ANUSOL-HC) 2.5 % rectal cream Place 1 application rectally 2 (two) times daily as needed for hemorrhoids (up to twice a day for up to 10 days at a time). 30 g 2   latanoprost (XALATAN) 0.005 % ophthalmic solution Place 1 drop into both eyes at bedtime.  3   lisinopril-hydrochlorothiazide (PRINZIDE,ZESTORETIC) 20-12.5 MG tablet Take 1 tablet by mouth 2 (two) times daily.   3   metoprolol tartrate  (LOPRESSOR) 25 MG tablet TAKE 1 & 1/2 TABLETS BY MOUTH TWICE A DAY (Patient taking differently: Take 37.5 mg by mouth 2 (two) times daily.) 270 tablet 3   Multiple Vitamin (MULITIVITAMIN WITH MINERALS) TABS Take 1 tablet by mouth daily.      omeprazole (PRILOSEC) 40 MG capsule Take 40 mg by mouth daily as needed (reflux).   3   polyethylene glycol powder (MIRALAX) powder Take 17 g by mouth daily. To prevent constipation (Patient taking differently: Take 17 g by mouth daily as needed for mild constipation. To prevent constipation) 255 g prn   predniSONE (STERAPRED UNI-PAK 21 TAB) 10 MG (21) TBPK tablet Take by mouth daily. Take 6 tabs by mouth daily  for 2 days, then 5 tabs for 2 days, then 4 tabs for 2 days, then 3 tabs for 2 days, 2 tabs for 2 days, then 1 tab by mouth daily for 2 days 42 tablet 0   No current facility-administered medications for this visit.    Past Medical History:  Diagnosis Date   Angina    Asthma    "as a child"   Coronary artery dissection    NSTEMI 8/12: cath with mid to dist LAD spont. dissection, unable to do PCI and referred for CABG:  SVG to LAD (Dr. PVT);  Carotid U/S 6/14:  Normal carotid arteries; FMD could not be ruled out   Coronary artery dissection 10/24/2010  Dyslipidemia    GERD (gastroesophageal reflux disease)    Heart disease    Hypertension    Iron deficiency anemia    Obesity    Papanicolaou smear of cervix with positive high risk human papilloma virus (HPV) test 04/11/2020   Pap negative but +HRHPV other, was negative for 16/18, repeat pap in 1 year per [facility] gudielines , 5 year CIN3+ risk is 2.25%    Past Surgical History:  Procedure Laterality Date   BILATERAL SALPINGECTOMY Bilateral 08/04/2017   Procedure: BILATERAL SALPINGECTOMY;  Surgeon: Jonnie Kind, MD;  Location: [facility];  Service: Gynecology;  Laterality: Bilateral;   CARDIAC SURGERY     open heart surgery   CERVICAL POLYPECTOMY  07/01/2011   Procedure: CERVICAL POLYPECTOMY;   Surgeon: Jonnie Kind, MD;  Location: [facility];  Service: Gynecology;  Laterality: N/A;  Endometrial Polypectomy   COLONOSCOPY N/A 02/23/2019   Procedure: COLONOSCOPY;  Surgeon: Danie Binder, MD;  [facility];  Service: Endoscopy;  Laterality: N/A;  8:30   CORONARY ARTERY BYPASS GRAFT  10/24/2010   CABG X1:  coronary artery dissection -->CABG:  SVG to LAD (Dr. PVT)   DILATION AND CURETTAGE OF UTERUS     HYSTEROSCOPY W/ ENDOMETRIAL ABLATION     LAPAROSCOPIC TUBAL LIGATION  07/01/2011   Procedure: LAPAROSCOPIC TUBAL LIGATION;  Surgeon: Jonnie Kind, MD;  Location: [facility];  Service: Gynecology;  Laterality: Bilateral;  Laparoscopic Bilateral Tubal Sterilization with Fallope Rings; ended at 63   POLYPECTOMY  02/23/2019   Procedure: POLYPECTOMY;  Surgeon: Danie Binder, MD;  [facility];  Service: Endoscopy;;  cold snare cecal, Hepatic flexure, Splenic flexure, and sigmoid polyps   SUPRACERVICAL ABDOMINAL HYSTERECTOMY N/A 08/04/2017   Procedure: HYSTERECTOMY SUPRACERVICAL ABDOMINAL;  Surgeon: Jonnie Kind, MD;  Location: [facility];  Service: Gynecology;  Laterality: N/A;   TUBAL LIGATION     UMBILICAL HERNIA REPAIR  07/01/2011   Procedure: HERNIA REPAIR UMBILICAL ADULT;  Surgeon: Jonnie Kind, MD;  Location: [facility];  Service: Gynecology;  Laterality: N/A;  during Laparoscopic Bilateral Tubal Sterilization   VENTRAL HERNIA REPAIR N/A 08/04/2017   Procedure: HERNIA REPAIR VENTRAL ADULT;  Surgeon: Jonnie Kind, MD;  Location: [facility];  Service: Gynecology;  Laterality: N/A;    Social History   Socioeconomic History   Marital status: Unknown    Spouse name: Not on file   Number of children: Not on file   Years of education: Not on file   Highest education level: Not on file  Occupational History   Not on file  Tobacco Use   Smoking status: Never   Smokeless tobacco: Never  Vaping Use   Vaping Use: Never used  Substance and Sexual Activity   Alcohol use:  No    Alcohol/week: 0.0 standard drinks   Drug use: No   Sexual activity: Yes    Birth control/protection: Surgical    Comment: supracervical hyst  Other Topics Concern   Not on file  Social History Narrative   Not on file   Social Determinants of Health   Financial Resource Strain: Low Risk    Difficulty of Paying Living Expenses: Not very hard  Food Insecurity: No Food Insecurity   Worried About Running Out of Food in the Last Year: Never true   Ran Out of Food in the Last Year: Never true  Transportation Needs: No Transportation Needs   Lack of Transportation (Medical): No   Lack of Transportation (  Non-Medical): No  Physical Activity: Sufficiently Active   Days of Exercise per Week: 4 days   Minutes of Exercise per Session: 70 min  Stress: No Stress Concern Present   Feeling of Stress : Not at all  Social Connections: Unknown   Frequency of Communication with Friends and Family: More than three times a week   Frequency of Social Gatherings with Friends and Family: Once a week   Attends Religious Services: More than 4 times per year   Active Member of [employer] or Organizations: Yes   Attends Music therapist: More than 4 times per year   Marital Status: Patient refused  Intimate Partner Violence: Not At Risk   Fear of Current or Ex-Partner: No   Emotionally Abused: No   Physically Abused: No   Sexually Abused: No    Barbarann Ehlers RN was present throughout the entirety of the encounter.  There were no vitals filed for this visit.   Wt Readings from Last 3 Encounters:  04/08/20 250 lb (113.4 kg)  04/03/20 285 lb (129.3 kg)  06/14/19 293 lb 6.4 oz (133.1 kg)     PHYSICAL EXAM  Affect appropriate Healthy:  appears stated age 40: normal Neck supple with no adenopathy JVP normal no bruits no thyromegaly Lungs clear with no wheezing and good diaphragmatic motion Heart:  S1/S2 no murmur, no rub, gallop or click PMI normal Abdomen: benighn, BS  positve, no tenderness, no AAA no bruit.  No HSM or HJR Distal pulses intact with no bruits No edema Neuro non-focal Skin warm and dry No muscular weakness    Labs: Lab Results  Component Value Date/Time   K 3.2 (L) 08/05/2017 06:00 AM   BUN 8 08/05/2017 06:00 AM   CREATININE 0.97 08/05/2017 06:00 AM   ALT 23 07/30/2017 11:05 AM   HGB 10.6 (L) 08/05/2017 06:00 AM     Lipids: Lab Results  Component Value Date/Time   LDLCALC 105 (H) 08/04/2012 11:47 AM   LDLDIRECT 125.9 11/01/2012 10:59 AM   CHOL 202 (H) 11/01/2012 10:59 AM   TRIG 165.0 (H) 11/01/2012 10:59 AM   HDL 47.00 11/01/2012 10:59 AM       ASSESSMENT AND PLAN:  1.  Coronary artery disease: History of one-vessel CABG in 2012.  Nuclear stress test was normal in November 2018.  Symptomatically stable.  Continue aspirin, atorvastatin, and Lopressor.   2.  Hypertension: Well controlled.  Continue current medications and low sodium Dash type diet.     3.  Hyperlipidemia: on statin labs followed by primary     Disposition: Follow up 1 year   Jenkins Rouge MD [facility]

## 2021-02-11 ENCOUNTER — Ambulatory Visit: Payer: BC Managed Care – PPO | Admitting: Cardiovascular Disease

## 2021-02-20 NOTE — Progress Notes (Incomplete)
HPI:55 y.o. new to me Previously seen by Dr Bronson Ing.  History of single vessel CABG in 2012 with SVG to LAD. Has normal EF and last myovue 01/09/17 normal with EF 69% She has HTN which is controlled Gained some weight during pandemic   ***  Soc Hx: Married. 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 Lives in [place]. Has 2 children.  Her husband works for [employer].  She has a 18 year old daughter who is completed her masters in Environmental health practitioner and is now working for the [employer]  She has a 69 year-old son who is a Equities trader in high school in [place] and taking advanced classes.  Review of Systems: As per "subjective", otherwise negative.  Allergies  Allergen Reactions   Morphine And Related Rash   Penicillins Rash    Has patient had a PCN reaction causing immediate rash, facial/tongue/throat swelling, SOB or lightheadedness with hypotension: yes Has patient had a PCN reaction causing severe rash involving mucus membranes or skin necrosis: no Has patient had a PCN reaction that required hospitalization: no Has patient had a PCN reaction occurring within the last 10 years: no If all of the above answers are "NO", then may proceed with Cephalosporin use.     Current Outpatient Medications  Medication Sig Dispense Refill   amLODipine (NORVASC) 10 MG tablet Take 10 mg by mouth daily.     aspirin EC 81 MG tablet Take 1 tablet (81 mg total) by mouth daily.     atorvastatin (LIPITOR) 20 MG tablet TAKE 1 TABLET DAILY 90 tablet 3   hydrocortisone (ANUSOL-HC) 2.5 % rectal cream Place 1 application rectally 2 (two) times daily as needed for hemorrhoids (up to twice a day for up to 10 days at a time). 30 g 2   latanoprost (XALATAN) 0.005 % ophthalmic solution Place 1 drop into both eyes at bedtime.  3   lisinopril-hydrochlorothiazide (PRINZIDE,ZESTORETIC) 20-12.5 MG tablet Take 1 tablet by mouth 2 (two) times daily.   3   metoprolol tartrate  (LOPRESSOR) 25 MG tablet TAKE 1 & 1/2 TABLETS BY MOUTH TWICE A DAY (Patient taking differently: Take 37.5 mg by mouth 2 (two) times daily.) 270 tablet 3   Multiple Vitamin (MULITIVITAMIN WITH MINERALS) TABS Take 1 tablet by mouth daily.      omeprazole (PRILOSEC) 40 MG capsule Take 40 mg by mouth daily as needed (reflux).   3   polyethylene glycol powder (MIRALAX) powder Take 17 g by mouth daily. To prevent constipation (Patient taking differently: Take 17 g by mouth daily as needed for mild constipation. To prevent constipation) 255 g prn   predniSONE (STERAPRED UNI-PAK 21 TAB) 10 MG (21) TBPK tablet Take by mouth daily. Take 6 tabs by mouth daily  for 2 days, then 5 tabs for 2 days, then 4 tabs for 2 days, then 3 tabs for 2 days, 2 tabs for 2 days, then 1 tab by mouth daily for 2 days 42 tablet 0   No current facility-administered medications for this visit.    Past Medical History:  Diagnosis Date   Angina    Asthma    "as a child"   Coronary artery dissection    NSTEMI 8/12: cath with mid to dist LAD spont. dissection, unable to do PCI and referred for CABG:  SVG to LAD (Dr. PVT);  Carotid U/S 6/14:  Normal carotid arteries; FMD could not be ruled out   Coronary artery dissection 10/24/2010  Dyslipidemia    GERD (gastroesophageal reflux disease)    Heart disease    Hypertension    Iron deficiency anemia    Obesity    Papanicolaou smear of cervix with positive high risk human papilloma virus (HPV) test 04/11/2020   Pap negative but +HRHPV other, was negative for 16/18, repeat pap in 1 year per [facility] gudielines , 5 year CIN3+ risk is 2.25%    Past Surgical History:  Procedure Laterality Date   BILATERAL SALPINGECTOMY Bilateral 08/04/2017   Procedure: BILATERAL SALPINGECTOMY;  Surgeon: Jonnie Kind, MD;  Location: [facility];  Service: Gynecology;  Laterality: Bilateral;   CARDIAC SURGERY     open heart surgery   CERVICAL POLYPECTOMY  07/01/2011   Procedure: CERVICAL POLYPECTOMY;   Surgeon: Jonnie Kind, MD;  Location: [facility];  Service: Gynecology;  Laterality: N/A;  Endometrial Polypectomy   COLONOSCOPY N/A 02/23/2019   Procedure: COLONOSCOPY;  Surgeon: Danie Binder, MD;  [facility];  Service: Endoscopy;  Laterality: N/A;  8:30   CORONARY ARTERY BYPASS GRAFT  10/24/2010   CABG X1:  coronary artery dissection -->CABG:  SVG to LAD (Dr. PVT)   DILATION AND CURETTAGE OF UTERUS     HYSTEROSCOPY W/ ENDOMETRIAL ABLATION     LAPAROSCOPIC TUBAL LIGATION  07/01/2011   Procedure: LAPAROSCOPIC TUBAL LIGATION;  Surgeon: Jonnie Kind, MD;  Location: [facility];  Service: Gynecology;  Laterality: Bilateral;  Laparoscopic Bilateral Tubal Sterilization with Fallope Rings; ended at 4   POLYPECTOMY  02/23/2019   Procedure: POLYPECTOMY;  Surgeon: Danie Binder, MD;  [facility];  Service: Endoscopy;;  cold snare cecal, Hepatic flexure, Splenic flexure, and sigmoid polyps   SUPRACERVICAL ABDOMINAL HYSTERECTOMY N/A 08/04/2017   Procedure: HYSTERECTOMY SUPRACERVICAL ABDOMINAL;  Surgeon: Jonnie Kind, MD;  Location: [facility];  Service: Gynecology;  Laterality: N/A;   TUBAL LIGATION     UMBILICAL HERNIA REPAIR  07/01/2011   Procedure: HERNIA REPAIR UMBILICAL ADULT;  Surgeon: Jonnie Kind, MD;  Location: [facility];  Service: Gynecology;  Laterality: N/A;  during Laparoscopic Bilateral Tubal Sterilization   VENTRAL HERNIA REPAIR N/A 08/04/2017   Procedure: HERNIA REPAIR VENTRAL ADULT;  Surgeon: Jonnie Kind, MD;  Location: [facility];  Service: Gynecology;  Laterality: N/A;    Social History   Socioeconomic History   Marital status: Unknown    Spouse name: Not on file   Number of children: Not on file   Years of education: Not on file   Highest education level: Not on file  Occupational History   Not on file  Tobacco Use   Smoking status: Never   Smokeless tobacco: Never  Vaping Use   Vaping Use: Never used  Substance and Sexual Activity   Alcohol use:  No    Alcohol/week: 0.0 standard drinks   Drug use: No   Sexual activity: Yes    Birth control/protection: Surgical    Comment: supracervical hyst  Other Topics Concern   Not on file  Social History Narrative   Not on file   Social Determinants of Health   Financial Resource Strain: Low Risk    Difficulty of Paying Living Expenses: Not very hard  Food Insecurity: No Food Insecurity   Worried About Running Out of Food in the Last Year: Never true   Ran Out of Food in the Last Year: Never true  Transportation Needs: No Transportation Needs   Lack of Transportation (Medical): No   Lack of Transportation (  Non-Medical): No  Physical Activity: Sufficiently Active   Days of Exercise per Week: 4 days   Minutes of Exercise per Session: 70 min  Stress: No Stress Concern Present   Feeling of Stress : Not at all  Social Connections: Unknown   Frequency of Communication with Friends and Family: More than three times a week   Frequency of Social Gatherings with Friends and Family: Once a week   Attends Religious Services: More than 4 times per year   Active Member of [employer] or Organizations: Yes   Attends Music therapist: More than 4 times per year   Marital Status: Patient refused  Intimate Partner Violence: Not At Risk   Fear of Current or Ex-Partner: No   Emotionally Abused: No   Physically Abused: No   Sexually Abused: No    Barbarann Ehlers RN was present throughout the entirety of the encounter.  There were no vitals filed for this visit.   Wt Readings from Last 3 Encounters:  04/08/20 250 lb (113.4 kg)  04/03/20 285 lb (129.3 kg)  06/14/19 293 lb 6.4 oz (133.1 kg)     PHYSICAL EXAM  Affect appropriate Healthy:  appears stated age 58: normal Neck supple with no adenopathy JVP normal no bruits no thyromegaly Lungs clear with no wheezing and good diaphragmatic motion Heart:  S1/S2 no murmur, no rub, gallop or click PMI normal Abdomen: benighn, BS  positve, no tenderness, no AAA no bruit.  No HSM or HJR Distal pulses intact with no bruits No edema Neuro non-focal Skin warm and dry No muscular weakness    Labs: Lab Results  Component Value Date/Time   K 3.2 (L) 08/05/2017 06:00 AM   BUN 8 08/05/2017 06:00 AM   CREATININE 0.97 08/05/2017 06:00 AM   ALT 23 07/30/2017 11:05 AM   HGB 10.6 (L) 08/05/2017 06:00 AM     Lipids: Lab Results  Component Value Date/Time   LDLCALC 105 (H) 08/04/2012 11:47 AM   LDLDIRECT 125.9 11/01/2012 10:59 AM   CHOL 202 (H) 11/01/2012 10:59 AM   TRIG 165.0 (H) 11/01/2012 10:59 AM   HDL 47.00 11/01/2012 10:59 AM       ASSESSMENT AND PLAN:  1.  Coronary artery disease: History of one-vessel CABG in 2012.  Nuclear stress test was normal in November 2018.  Symptomatically stable.  Continue aspirin, atorvastatin, and Lopressor.   2.  Hypertension: Well controlled.  Continue current medications and low sodium Dash type diet.     3.  Hyperlipidemia: on statin labs followed by primary     Disposition: Follow up 1 year   Jenkins Rouge MD [facility]

## 2021-02-21 ENCOUNTER — Ambulatory Visit: Payer: BC Managed Care – PPO | Admitting: Cardiovascular Disease

## 2021-02-27 ENCOUNTER — Other Ambulatory Visit (INDEPENDENT_AMBULATORY_CARE_PROVIDER_SITE_OTHER): Payer: BC Managed Care – PPO

## 2021-02-27 ENCOUNTER — Other Ambulatory Visit: Payer: Self-pay

## 2021-02-27 ENCOUNTER — Other Ambulatory Visit (HOSPITAL_COMMUNITY)
Admission: RE | Admit: 2021-02-27 | Discharge: 2021-02-27 | Disposition: A | Discharge status: Home / Self Care | Payer: BC Managed Care – PPO | Source: Ambulatory Visit | Attending: Obstetrics & Gynecology | Admitting: Obstetrics & Gynecology

## 2021-02-27 DIAGNOSIS — Z113 Encounter for screening for infections with a predominantly sexual mode of transmission: Secondary | ICD-10-CM | POA: Insufficient documentation

## 2021-02-27 NOTE — Progress Notes (Signed)
° °  NURSE VISIT- VAGINITIS/STD/POC  SUBJECTIVE:  Ashlee Stein is a 56 y.o. [id number] GYN patientfemale here for a vaginal swab for vaginitis screening, STD screen.  She reports the following symptoms: none. Denies abnormal vaginal bleeding, significant pelvic pain, fever, or UTI symptoms. Pt wants blood work done at her provider visit for other STD's.  OBJECTIVE:  LMP 07/23/2017 (Approximate) Comment: [place]  Appears well, in no apparent distress  ASSESSMENT: Vaginal swab for  vaginitis & STD screening  PLAN: Self-collected vaginal probe for Gonorrhea, Chlamydia, Trichomonas, Bacterial Vaginosis, Yeast sent to lab Treatment: to be determined once results are received Follow-up as needed if symptoms persist/worsen, or new symptoms develop  Alfonso Shackett A Tanish Sinkler  02/27/2021 4:17 PM

## 2021-02-27 NOTE — Progress Notes (Signed)
ft 

## 2021-03-01 ENCOUNTER — Other Ambulatory Visit: Payer: Self-pay | Admitting: Adult Health

## 2021-03-01 LAB — CERVICOVAGINAL ANCILLARY ONLY
Bacterial Vaginitis (gardnerella): POSITIVE — AB
Candida Glabrata: NEGATIVE
Candida Vaginitis: NEGATIVE
Chlamydia: NEGATIVE
Comment: NEGATIVE
Comment: NEGATIVE
Comment: NEGATIVE
Comment: NEGATIVE
Comment: NEGATIVE
Comment: NORMAL
Neisseria Gonorrhea: NEGATIVE
Trichomonas: NEGATIVE

## 2021-03-01 MED ORDER — METRONIDAZOLE 500 MG PO TABS: 500.0000 mg | ORAL_TABLET | Freq: Two times a day (BID) | ORAL | 0 refills | Status: DC

## 2021-03-01 NOTE — Progress Notes (Signed)
+  BV on vaginal swab will rx flagyl 

## 2021-04-04 ENCOUNTER — Ambulatory Visit (INDEPENDENT_AMBULATORY_CARE_PROVIDER_SITE_OTHER): Payer: BC Managed Care – PPO | Admitting: Adult Health

## 2021-04-04 ENCOUNTER — Other Ambulatory Visit: Payer: Self-pay

## 2021-04-04 ENCOUNTER — Encounter: Payer: Self-pay | Admitting: Adult Health

## 2021-04-04 ENCOUNTER — Other Ambulatory Visit (HOSPITAL_COMMUNITY)
Admission: RE | Admit: 2021-04-04 | Discharge: 2021-04-04 | Disposition: A | Discharge status: Home / Self Care | Payer: BC Managed Care – PPO | Source: Ambulatory Visit | Attending: Adult Health | Admitting: Adult Health

## 2021-04-04 VITALS — BP 124/87 | HR 64 | Ht 67.0 in | Wt 296.0 lb

## 2021-04-04 DIAGNOSIS — L80 Vitiligo: Secondary | ICD-10-CM

## 2021-04-04 DIAGNOSIS — Z1211 Encounter for screening for malignant neoplasm of colon: Secondary | ICD-10-CM | POA: Diagnosis not present

## 2021-04-04 DIAGNOSIS — Z90711 Acquired absence of uterus with remaining cervical stump: Secondary | ICD-10-CM

## 2021-04-04 DIAGNOSIS — Z01419 Encounter for gynecological examination (general) (routine) without abnormal findings: Secondary | ICD-10-CM

## 2021-04-04 DIAGNOSIS — K429 Umbilical hernia without obstruction or gangrene: Secondary | ICD-10-CM

## 2021-04-04 DIAGNOSIS — R87810 Cervical high risk human papillomavirus (HPV) DNA test positive: Secondary | ICD-10-CM

## 2021-04-04 LAB — HEMOCCULT GUIAC POC 1CARD (OFFICE): Fecal Occult Blood, POC: NEGATIVE

## 2021-04-04 NOTE — Progress Notes (Signed)
Patient ID: Ashlee Stein, female   DOB: 26-Nov-1965, 56 y.o.   MRN: [id number] History of Present Illness: Ashlee Stein is a 56 year old black female, divorced, sp [facility], in for well woman gyn exam and pap.  Lab Results  Component Value Date   DIAGPAP  04/03/2020    - Negative for intraepithelial lesion or malignancy (NILM)   HPV NOT DETECTED 11/20/2016   HPVHIGH Positive (A) 04/03/2020   PCP is Dr Legrand Rams  Current Medications, Allergies, Past Medical History, Past Surgical History, Family History and Social History were reviewed in [facility] record.     Review of Systems: Patient denies any headaches, hearing loss, fatigue, blurred vision, shortness of breath, chest pain, abdominal pain, problems with bowel movements, urination, or intercourse. No joint pain or mood swings.     Physical Exam:BP 124/87 (BP Location: Right Arm, Patient Position: Sitting, Cuff Size: Normal)    Pulse 64    Ht 5\' 7"  (1.702 m)    Wt 296 lb (134.3 kg)    LMP 07/23/2017 (Approximate) Comment: [place]   BMI 46.36 kg/m   General:  Well developed, well nourished, no acute distress Skin:  Warm and dry,has vitiligo  Neck:  Midline trachea, normal thyroid, good ROM, no lymphadenopathy Lungs; Clear to auscultation bilaterally Breast:  No dominant palpable mass, retraction, or nipple discharge Cardiovascular: Regular rate and rhythm,has well healed scar from heart surgery  Abdomen:  Soft, non tender, no hepatosplenomegaly Pelvic:  External genitalia is normal in appearance, no lesions.  The vagina is normal in appearance. Urethra has no lesions or masses. The cervix is smooth, pap with GC/CHL and HR HPV genotyping performed.  Uterus is absent.  No adnexal masses or tenderness noted.Bladder is non tender, no masses felt. Rectal: Good sphincter tone, no polyps, or hemorrhoids felt.  Hemoccult negative. Extremities/musculoskeletal:  No swelling or varicosities noted, no clubbing or cyanosis Psych:  No mood  changes, alert and cooperative,seems happy AA is 0 Fall risk is low Depression screen [facility] 2/9 04/04/2021 04/03/2020 11/20/2016  Decreased Interest 0 0 0  Down, Depressed, Hopeless 0 0 0  PHQ - 2 Score 0 0 0  Altered sleeping 0 0 -  Tired, decreased energy 0 0 -  Change in appetite 0 0 -  Feeling bad or failure about yourself  0 0 -  Trouble concentrating 0 0 -  Moving slowly or fidgety/restless 0 0 -  Suicidal thoughts 0 0 -  PHQ-9 Score 0 0 -  Some recent data might be hidden    GAD 7 : Generalized Anxiety Score 04/04/2021 04/03/2020  Nervous, Anxious, on Edge 0 0  Control/stop worrying 0 0  Worry too much - different things 0 0  Trouble relaxing 0 0  Restless 0 0  Easily annoyed or irritable 0 0  Afraid - awful might happen 0 0  Total GAD 7 Score 0 0    Upstream - 04/04/21 0945       Pregnancy Intention Screening   Does the patient want to become pregnant in the next year? N/A    Would the patient like to discuss contraceptive options today? N/A      Contraception Wrap Up   Current Method --   sp [facility]   End Method --   [facility]   Contraception Counseling Provided No               Examination chaperoned by Celene Squibb LPN  Impression and Plan: 1. Encounter for  gynecological examination with Papanicolaou smear of cervix Pap sent Pap in 3 years if normal Physical in 1 year Labs with PCP  Mammogram yearly Colonoscopy per GI   2. Encounter for screening fecal occult blood testing  3. Vitiligo  4. S/P abdominal supracervical subtotal hysterectomy  5. Papanicolaou smear of cervix with positive high risk human papilloma virus (HPV) test Pap sent with HR HPV and GC/CHL   6. Umbilical hernia without obstruction and without gangrene No pain will watch

## 2021-04-09 ENCOUNTER — Telehealth: Payer: Self-pay

## 2021-04-09 ENCOUNTER — Other Ambulatory Visit: Payer: Self-pay | Admitting: Adult Health

## 2021-04-09 LAB — CYTOLOGY - PAP
Adequacy: ABSENT
Chlamydia: NEGATIVE
Comment: NEGATIVE
Comment: NEGATIVE
Comment: NORMAL
Diagnosis: NEGATIVE
Diagnosis: REACTIVE
High risk HPV: NEGATIVE
Neisseria Gonorrhea: NEGATIVE

## 2021-04-09 MED ORDER — METRONIDAZOLE 500 MG PO TABS: 500.0000 mg | ORAL_TABLET | Freq: Two times a day (BID) | ORAL | 0 refills | Status: DC

## 2021-04-09 NOTE — Progress Notes (Signed)
+  BV on pap will rx flagyl  

## 2021-04-09 NOTE — Telephone Encounter (Signed)
Returned pt's call, two identifiers used. Test results and prescription sent reviewed with pt. Pt confirmed understanding.

## 2021-04-09 NOTE — Telephone Encounter (Signed)
PT CALLED AND WANTED TO SPEAK WITH A NURSE ABOUT HER TEST RESULTS

## 2021-04-25 NOTE — Progress Notes (Unsigned)
SUBJECTIVE: 56 y.o. new to me Previously followed by Dr Bronson Ing  She has a history of coronary artery disease and underwent 1-vessel CABG (SVG to LAD) in August 2012. This was for a complication of acute coronary dissection after attempted intervention  She also has a history of hypertension.   Most recent echocardiogram I find is dated 11/21/10 and showed normal left ventricular systolic function and regional wall motion, LVEF 60-65%, mild mitral regurgitation, mild biatrial dilatation, mild right ventricular dilatation, and moderate tricuspid regurgitation, PA pressure 34 mmHg.   Nuclear stress test was normal on 01/09/2017, EF 69%.  The patient denies any symptoms of chest pain, palpitations, shortness of breath, lightheadedness, dizziness, leg swelling, orthopnea, PND, and syncope.  Significant weight gain during pandemic No angina   ***   Soc Hx: Married. 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 Has 2 children.  Her husband works for [employer].  She has a 58 year old daughter (2020) who is completed her masters in Environmental health practitioner and is now working for the [employer]  She has a 49 year old son (2020) who is a Equities trader in high school in [place] and taking advanced classes.  Review of Systems: As per "subjective", otherwise negative.  Allergies  Allergen Reactions   Morphine And Related Rash   Penicillins Rash    Has patient had a PCN reaction causing immediate rash, facial/tongue/throat swelling, SOB or lightheadedness with hypotension: yes Has patient had a PCN reaction causing severe rash involving mucus membranes or skin necrosis: no Has patient had a PCN reaction that required hospitalization: no Has patient had a PCN reaction occurring within the last 10 years: no If all of the above answers are "NO", then may proceed with Cephalosporin use.     Current Outpatient Medications  Medication Sig Dispense  Refill   amLODipine (NORVASC) 10 MG tablet Take 10 mg by mouth daily.     aspirin EC 81 MG tablet Take 1 tablet (81 mg total) by mouth daily.     atorvastatin (LIPITOR) 20 MG tablet TAKE 1 TABLET DAILY 90 tablet 3   hydrocortisone (ANUSOL-HC) 2.5 % rectal cream Place 1 application rectally 2 (two) times daily as needed for hemorrhoids (up to twice a day for up to 10 days at a time). 30 g 2   latanoprost (XALATAN) 0.005 % ophthalmic solution Place 1 drop into both eyes at bedtime.  3   lisinopril-hydrochlorothiazide (PRINZIDE,ZESTORETIC) 20-12.5 MG tablet Take 1 tablet by mouth 2 (two) times daily.   3   metoprolol tartrate (LOPRESSOR) 25 MG tablet TAKE 1 & 1/2 TABLETS BY MOUTH TWICE A DAY (Patient taking differently: Take 37.5 mg by mouth 2 (two) times daily.) 270 tablet 3   metroNIDAZOLE (FLAGYL) 500 MG tablet Take 1 tablet (500 mg total) by mouth 2 (two) times daily. (Patient not taking: Reported on 04/04/2021) 14 tablet 0   metroNIDAZOLE (FLAGYL) 500 MG tablet Take 1 tablet (500 mg total) by mouth 2 (two) times daily. 14 tablet 0   Multiple Vitamin (MULITIVITAMIN WITH MINERALS) TABS Take 1 tablet by mouth daily.  (Patient not taking: Reported on 04/04/2021)     omeprazole (PRILOSEC) 40 MG capsule Take 40 mg by mouth daily as needed (reflux).  (Patient not taking: Reported on 04/04/2021)  3   polyethylene glycol powder (MIRALAX) powder Take 17 g by mouth daily. To prevent constipation (Patient not taking: Reported on 04/04/2021) 255 g prn   No current facility-administered medications  for this visit.    Past Medical History:  Diagnosis Date   Angina    Asthma    "as a child"   Coronary artery dissection    NSTEMI 8/12: cath with mid to dist LAD spont. dissection, unable to do PCI and referred for CABG:  SVG to LAD (Dr. PVT);  Carotid U/S 6/14:  Normal carotid arteries; FMD could not be ruled out   Coronary artery dissection 10/24/2010   Dyslipidemia    GERD (gastroesophageal reflux disease)     Heart disease    Hypertension    Iron deficiency anemia    Obesity    Papanicolaou smear of cervix with positive high risk human papilloma virus (HPV) test 04/11/2020   Pap negative but +HRHPV other, was negative for 16/18, repeat pap in 1 year per [facility] gudielines , 5 year CIN3+ risk is 2.25%    Past Surgical History:  Procedure Laterality Date   BILATERAL SALPINGECTOMY Bilateral 08/04/2017   Procedure: BILATERAL SALPINGECTOMY;  Surgeon: Jonnie Kind, MD;  Location: [facility];  Service: Gynecology;  Laterality: Bilateral;   CARDIAC SURGERY     open heart surgery   CERVICAL POLYPECTOMY  07/01/2011   Procedure: CERVICAL POLYPECTOMY;  Surgeon: Jonnie Kind, MD;  Location: [facility];  Service: Gynecology;  Laterality: N/A;  Endometrial Polypectomy   COLONOSCOPY N/A 02/23/2019   Procedure: COLONOSCOPY;  Surgeon: Danie Binder, MD;  [facility];  Service: Endoscopy;  Laterality: N/A;  8:30   CORONARY ARTERY BYPASS GRAFT  10/24/2010   CABG X1:  coronary artery dissection -->CABG:  SVG to LAD (Dr. PVT)   DILATION AND CURETTAGE OF UTERUS     HYSTEROSCOPY W/ ENDOMETRIAL ABLATION     LAPAROSCOPIC TUBAL LIGATION  07/01/2011   Procedure: LAPAROSCOPIC TUBAL LIGATION;  Surgeon: Jonnie Kind, MD;  Location: [facility];  Service: Gynecology;  Laterality: Bilateral;  Laparoscopic Bilateral Tubal Sterilization with Fallope Rings; ended at 38   POLYPECTOMY  02/23/2019   Procedure: POLYPECTOMY;  Surgeon: Danie Binder, MD;  [facility];  Service: Endoscopy;;  cold snare cecal, Hepatic flexure, Splenic flexure, and sigmoid polyps   SUPRACERVICAL ABDOMINAL HYSTERECTOMY N/A 08/04/2017   Procedure: HYSTERECTOMY SUPRACERVICAL ABDOMINAL;  Surgeon: Jonnie Kind, MD;  Location: [facility];  Service: Gynecology;  Laterality: N/A;   TUBAL LIGATION     UMBILICAL HERNIA REPAIR  07/01/2011   Procedure: HERNIA REPAIR UMBILICAL ADULT;  Surgeon: Jonnie Kind, MD;  Location: [facility];  Service:  Gynecology;  Laterality: N/A;  during Laparoscopic Bilateral Tubal Sterilization   VENTRAL HERNIA REPAIR N/A 08/04/2017   Procedure: HERNIA REPAIR VENTRAL ADULT;  Surgeon: Jonnie Kind, MD;  Location: [facility];  Service: Gynecology;  Laterality: N/A;    Social History   Socioeconomic History   Marital status: Divorced    Spouse name: Not on file   Number of children: Not on file   Years of education: Not on file   Highest education level: Not on file  Occupational History   Not on file  Tobacco Use   Smoking status: Never   Smokeless tobacco: Never  Vaping Use   Vaping Use: Never used  Substance and Sexual Activity   Alcohol use: No    Alcohol/week: 0.0 standard drinks   Drug use: No   Sexual activity: Yes    Birth control/protection: Surgical    Comment: supracervical hyst  Other Topics Concern   Not on file  Social History Narrative  Not on file   Social Determinants of Health   Financial Resource Strain: Low Risk    Difficulty of Paying Living Expenses: Not very hard  Food Insecurity: No Food Insecurity   Worried About Running Out of Food in the Last Year: Never true   Ran Out of Food in the Last Year: Never true  Transportation Needs: No Transportation Needs   Lack of Transportation (Medical): No   Lack of Transportation (Non-Medical): No  Physical Activity: Sufficiently Active   Days of Exercise per Week: 3 days   Minutes of Exercise per Session: 60 min  Stress: No Stress Concern Present   Feeling of Stress : Not at all  Social Connections: Unknown   Frequency of Communication with Friends and Family: More than three times a week   Frequency of Social Gatherings with Friends and Family: Twice a week   Attends Religious Services: More than 4 times per year   Active Member of [employer] or Organizations: Yes   Attends Music therapist: More than 4 times per year   Marital Status: Patient refused  Intimate Partner Violence: Not At Risk   Fear of  Current or Ex-Partner: No   Emotionally Abused: No   Physically Abused: No   Sexually Abused: No    Barbarann Ehlers RN was present throughout the entirety of the encounter.  There were no vitals filed for this visit.   Wt Readings from Last 3 Encounters:  04/04/21 296 lb (134.3 kg)  04/08/20 250 lb (113.4 kg)  04/03/20 285 lb (129.3 kg)     PHYSICAL EXAM  Affect appropriate Healthy:  appears stated age 85: normal Neck supple with no adenopathy JVP normal no bruits no thyromegaly Lungs clear with no wheezing and good diaphragmatic motion Heart:  S1/S2 no murmur, no rub, gallop or click PMI normal Abdomen: benighn, BS positve, no tenderness, no AAA no bruit.  No HSM or HJR Distal pulses intact with no bruits No edema Neuro non-focal Skin warm and dry No muscular weakness  Labs: Lab Results  Component Value Date/Time   K 3.2 (L) 08/05/2017 06:00 AM   BUN 8 08/05/2017 06:00 AM   CREATININE 0.97 08/05/2017 06:00 AM   ALT 23 07/30/2017 11:05 AM   HGB 10.6 (L) 08/05/2017 06:00 AM     Lipids: Lab Results  Component Value Date/Time   LDLCALC 105 (H) 08/04/2012 11:47 AM   LDLDIRECT 125.9 11/01/2012 10:59 AM   CHOL 202 (H) 11/01/2012 10:59 AM   TRIG 165.0 (H) 11/01/2012 10:59 AM   HDL 47.00 11/01/2012 10:59 AM       ASSESSMENT AND PLAN:  1.  Coronary artery disease: History of one-vessel CABG in 2012.  Nuclear stress test was normal in November 2018.  Symptomatically stable.  Continue aspirin, atorvastatin, and Lopressor. Note initial pathology was coronary artery dissection not plaque    2.  Hypertension: Well controlled.  Continue current medications and low sodium Dash type diet.     3.  Hyperlipidemia: I will obtain a copy of lipids from PCP.  Continue atorvastatin.    Disposition: Follow up 1 year   Jenkins Rouge MD [facility]

## 2021-05-02 ENCOUNTER — Ambulatory Visit: Payer: BC Managed Care – PPO | Admitting: Cardiovascular Disease

## 2021-06-01 NOTE — Progress Notes (Incomplete)
? ? ? ? ?SUBJECTIVE: 56 y.o. new to me Previously followed by Dr Bronson Ing  She has a history of coronary artery disease and underwent 1-vessel CABG (SVG to LAD) in August 2012. This was for a complication of acute coronary dissection after attempted intervention ? ?She also has a history of hypertension. ?  ?Most recent echocardiogram I find is dated 11/21/10 and showed normal left ventricular systolic function and regional wall motion, LVEF 60-65%, mild mitral regurgitation, mild biatrial dilatation, mild right ventricular dilatation, and moderate tricuspid regurgitation, PA pressure 34 mmHg. ?  ?Nuclear stress test was normal on 01/09/2017, EF 69%. ? ?The patient denies any symptoms of chest pain, palpitations, shortness of breath, lightheadedness, dizziness, leg swelling, orthopnea, PND, and syncope. ? ?Significant weight gain during pandemic No angina  ? ?*** ? ? ?Soc Hx: Married. 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 Lives in [place]. Has 2 children.  Her husband works for [employer].  She has a 65 year old daughter (2020) who is completed her masters in Environmental health practitioner and is now working for the [employer]  She has a 31 year old son (2020) who is a Equities trader in high school in [place] and taking advanced classes. ? ?Review of Systems: As per "subjective", otherwise negative. ? ?Allergies  ?Allergen Reactions  ? Morphine And Related Rash  ? Penicillins Rash  ?  Has patient had a PCN reaction causing immediate rash, facial/tongue/throat swelling, SOB or lightheadedness with hypotension: yes ?Has patient had a PCN reaction causing severe rash involving mucus membranes or skin necrosis: no ?Has patient had a PCN reaction that required hospitalization: no ?Has patient had a PCN reaction occurring within the last 10 years: no ?If all of the above answers are "NO", then may proceed with Cephalosporin use. ?  ? ? ?Current Outpatient Medications  ?Medication Sig Dispense  Refill  ? amLODipine (NORVASC) 10 MG tablet Take 10 mg by mouth daily.    ? aspirin EC 81 MG tablet Take 1 tablet (81 mg total) by mouth daily.    ? atorvastatin (LIPITOR) 20 MG tablet TAKE 1 TABLET DAILY 90 tablet 3  ? hydrocortisone (ANUSOL-HC) 2.5 % rectal cream Place 1 application rectally 2 (two) times daily as needed for hemorrhoids (up to twice a day for up to 10 days at a time). 30 g 2  ? latanoprost (XALATAN) 0.005 % ophthalmic solution Place 1 drop into both eyes at bedtime.  3  ? lisinopril-hydrochlorothiazide (PRINZIDE,ZESTORETIC) 20-12.5 MG tablet Take 1 tablet by mouth 2 (two) times daily.   3  ? metoprolol tartrate (LOPRESSOR) 25 MG tablet TAKE 1 & 1/2 TABLETS BY MOUTH TWICE A DAY (Patient taking differently: Take 37.5 mg by mouth 2 (two) times daily.) 270 tablet 3  ? metroNIDAZOLE (FLAGYL) 500 MG tablet Take 1 tablet (500 mg total) by mouth 2 (two) times daily. (Patient not taking: Reported on 04/04/2021) 14 tablet 0  ? metroNIDAZOLE (FLAGYL) 500 MG tablet Take 1 tablet (500 mg total) by mouth 2 (two) times daily. 14 tablet 0  ? Multiple Vitamin (MULITIVITAMIN WITH MINERALS) TABS Take 1 tablet by mouth daily.  (Patient not taking: Reported on 04/04/2021)    ? omeprazole (PRILOSEC) 40 MG capsule Take 40 mg by mouth daily as needed (reflux).  (Patient not taking: Reported on 04/04/2021)  3  ? polyethylene glycol powder (MIRALAX) powder Take 17 g by mouth daily. To prevent constipation (Patient not taking: Reported on 04/04/2021) 255 g prn  ? ?No current facility-administered medications  for this visit.  ? ? ?Past Medical History:  ?Diagnosis Date  ? Angina   ? Asthma   ? "as a child"  ? Coronary artery dissection   ? NSTEMI 8/12: cath with mid to dist LAD spont. dissection, unable to do PCI and referred for CABG:  SVG to LAD (Dr. PVT);  Carotid U/S 6/14:  Normal carotid arteries; FMD could not be ruled out  ? Coronary artery dissection 10/24/2010  ? Dyslipidemia   ? GERD (gastroesophageal reflux disease)   ?  Heart disease   ? Hypertension   ? Iron deficiency anemia   ? Obesity   ? Papanicolaou smear of cervix with positive high risk human papilloma virus (HPV) test 04/11/2020  ? Pap negative but +HRHPV other, was negative for 16/18, repeat pap in 1 year per [facility] gudielines , 5 year CIN3+ risk is 2.25%  ? ? ?Past Surgical History:  ?Procedure Laterality Date  ? BILATERAL SALPINGECTOMY Bilateral 08/04/2017  ? Procedure: BILATERAL SALPINGECTOMY;  Surgeon: Jonnie Kind, MD;  Location: [facility];  Service: Gynecology;  Laterality: Bilateral;  ? CARDIAC SURGERY    ? open heart surgery  ? CERVICAL POLYPECTOMY  07/01/2011  ? Procedure: CERVICAL POLYPECTOMY;  Surgeon: Jonnie Kind, MD;  Location: [facility];  Service: Gynecology;  Laterality: N/A;  Endometrial Polypectomy  ? COLONOSCOPY N/A 02/23/2019  ? Procedure: COLONOSCOPY;  Surgeon: Danie Binder, MD;  Location: [facility];  Service: Endoscopy;  Laterality: N/A;  8:30  ? CORONARY ARTERY BYPASS GRAFT  10/24/2010  ? CABG X1:  coronary artery dissection -->CABG:  SVG to LAD (Dr. PVT)  ? DILATION AND CURETTAGE OF UTERUS    ? HYSTEROSCOPY W/ ENDOMETRIAL ABLATION    ? LAPAROSCOPIC TUBAL LIGATION  07/01/2011  ? Procedure: LAPAROSCOPIC TUBAL LIGATION;  Surgeon: Jonnie Kind, MD;  Location: [facility];  Service: Gynecology;  Laterality: Bilateral;  Laparoscopic Bilateral Tubal Sterilization with Fallope Rings; ended at 1143  ? POLYPECTOMY  02/23/2019  ? Procedure: POLYPECTOMY;  Surgeon: Danie Binder, MD;  Location: [facility];  Service: Endoscopy;;  cold snare cecal, Hepatic flexure, Splenic flexure, and sigmoid polyps  ? SUPRACERVICAL ABDOMINAL HYSTERECTOMY N/A 08/04/2017  ? Procedure: HYSTERECTOMY SUPRACERVICAL ABDOMINAL;  Surgeon: Jonnie Kind, MD;  Location: [facility];  Service: Gynecology;  Laterality: N/A;  ? TUBAL LIGATION    ? UMBILICAL HERNIA REPAIR  07/01/2011  ? Procedure: HERNIA REPAIR UMBILICAL ADULT;  Surgeon: Jonnie Kind, MD;  Location: [facility];  Service:  Gynecology;  Laterality: N/A;  during Laparoscopic Bilateral Tubal Sterilization  ? VENTRAL HERNIA REPAIR N/A 08/04/2017  ? Procedure: HERNIA REPAIR VENTRAL ADULT;  Surgeon: Jonnie Kind, MD;  Location: [facility];  Service: Gynecology;  Laterality: N/A;  ? ? ?Social History  ? ?Socioeconomic History  ? Marital status: Divorced  ?  Spouse name: Not on file  ? Number of children: Not on file  ? Years of education: Not on file  ? Highest education level: Not on file  ?Occupational History  ? Not on file  ?Tobacco Use  ? Smoking status: Never  ? Smokeless tobacco: Never  ?Vaping Use  ? Vaping Use: Never used  ?Substance and Sexual Activity  ? Alcohol use: No  ?  Alcohol/week: 0.0 standard drinks  ? Drug use: No  ? Sexual activity: Yes  ?  Birth control/protection: Surgical  ?  Comment: supracervical hyst  ?Other Topics Concern  ? Not on file  ?Social History Narrative  ?  Not on file  ? ?Social Determinants of Health  ? ?Financial Resource Strain: Low Risk   ? Difficulty of Paying Living Expenses: Not very hard  ?Food Insecurity: No Food Insecurity  ? Worried About Charity fundraiser in the Last Year: Never true  ? Ran Out of Food in the Last Year: Never true  ?Transportation Needs: No Transportation Needs  ? Lack of Transportation (Medical): No  ? Lack of Transportation (Non-Medical): No  ?Physical Activity: Sufficiently Active  ? Days of Exercise per Week: 3 days  ? Minutes of Exercise per Session: 60 min  ?Stress: No Stress Concern Present  ? Feeling of Stress : Not at all  ?Social Connections: Unknown  ? Frequency of Communication with Friends and Family: More than three times a week  ? Frequency of Social Gatherings with Friends and Family: Twice a week  ? Attends Religious Services: More than 4 times per year  ? Active Member of Clubs or Organizations: Yes  ? Attends Archivist Meetings: More than 4 times per year  ? Marital Status: Patient refused  ?Intimate Partner Violence: Not At Risk  ? Fear of  Current or Ex-Partner: No  ? Emotionally Abused: No  ? Physically Abused: No  ? Sexually Abused: No  ? ? ?Barbarann Ehlers RN was present throughout the entirety of the encounter. ? ?There were no vitals filed for [employer]

## 2021-06-10 ENCOUNTER — Ambulatory Visit: Payer: BC Managed Care – PPO | Admitting: Cardiovascular Disease

## 2021-06-11 ENCOUNTER — Ambulatory Visit (INDEPENDENT_AMBULATORY_CARE_PROVIDER_SITE_OTHER): Payer: BC Managed Care – PPO | Admitting: Podiatry

## 2021-06-11 DIAGNOSIS — Z79899 Other long term (current) drug therapy: Secondary | ICD-10-CM

## 2021-06-11 DIAGNOSIS — B351 Tinea unguium: Secondary | ICD-10-CM

## 2021-06-11 NOTE — Progress Notes (Signed)
?Subjective:  ?Patient ID: Ashlee Stein, female    DOB: 06-Nov-1965,  MRN: [id number] ? ?Chief Complaint  ?Patient presents with  ? Nail Problem  ?  Possible nail fungus   ? ? ?56 y.o. female presents with the above complaint.  Patient presents with thickened elongated dystrophic toenails to bilateral hallux.  Mild pain on palpation.  She would like to discuss options for this.  She has failed all over-the-counter treatment options.  She denies seeing anyone else prior to seeing me.  She has never taken an oral medication before. ? ? ?Review of Systems: Negative except as noted in the HPI. Denies N/V/F/Ch. ? ?Past Medical History:  ?Diagnosis Date  ? Angina   ? Asthma   ? "as a child"  ? Coronary artery dissection   ? NSTEMI 8/12: cath with mid to dist LAD spont. dissection, unable to do PCI and referred for CABG:  SVG to LAD (Dr. PVT);  Carotid U/S 6/14:  Normal carotid arteries; FMD could not be ruled out  ? Coronary artery dissection 10/24/2010  ? Dyslipidemia   ? GERD (gastroesophageal reflux disease)   ? Heart disease   ? Hypertension   ? Iron deficiency anemia   ? Obesity   ? Papanicolaou smear of cervix with positive high risk human papilloma virus (HPV) test 04/11/2020  ? Pap negative but +HRHPV other, was negative for 16/18, repeat pap in 1 year per [facility] gudielines , 5 year CIN3+ risk is 2.25%  ? ? ?Current Outpatient Medications:  ?  amLODipine (NORVASC) 10 MG tablet, Take 10 mg by mouth daily., Disp: , Rfl:  ?  aspirin EC 81 MG tablet, Take 1 tablet (81 mg total) by mouth daily., Disp: , Rfl:  ?  atorvastatin (LIPITOR) 20 MG tablet, TAKE 1 TABLET DAILY, Disp: 90 tablet, Rfl: 3 ?  hydrocortisone (ANUSOL-HC) 2.5 % rectal cream, Place 1 application rectally 2 (two) times daily as needed for hemorrhoids (up to twice a day for up to 10 days at a time)., Disp: 30 g, Rfl: 2 ?  latanoprost (XALATAN) 0.005 % ophthalmic solution, Place 1 drop into both eyes at bedtime., Disp: , Rfl: 3 ?  lisinopril-hydrochlorothiazide  (PRINZIDE,ZESTORETIC) 20-12.5 MG tablet, Take 1 tablet by mouth 2 (two) times daily. , Disp: , Rfl: 3 ?  metoprolol tartrate (LOPRESSOR) 25 MG tablet, TAKE 1 & 1/2 TABLETS BY MOUTH TWICE A DAY (Patient taking differently: Take 37.5 mg by mouth 2 (two) times daily.), Disp: 270 tablet, Rfl: 3 ?  metroNIDAZOLE (FLAGYL) 500 MG tablet, Take 1 tablet (500 mg total) by mouth 2 (two) times daily. (Patient not taking: Reported on 04/04/2021), Disp: 14 tablet, Rfl: 0 ?  metroNIDAZOLE (FLAGYL) 500 MG tablet, Take 1 tablet (500 mg total) by mouth 2 (two) times daily., Disp: 14 tablet, Rfl: 0 ?  Multiple Vitamin (MULITIVITAMIN WITH MINERALS) TABS, Take 1 tablet by mouth daily.  (Patient not taking: Reported on 04/04/2021), Disp: , Rfl:  ?  omeprazole (PRILOSEC) 40 MG capsule, Take 40 mg by mouth daily as needed (reflux).  (Patient not taking: Reported on 04/04/2021), Disp: , Rfl: 3 ?  polyethylene glycol powder (MIRALAX) powder, Take 17 g by mouth daily. To prevent constipation (Patient not taking: Reported on 04/04/2021), Disp: 255 g, Rfl: prn ? ?Social History  ? ?Tobacco Use  ?Smoking Status Never  ?Smokeless Tobacco Never  ? ? ?Allergies  ?Allergen Reactions  ? Morphine And Related Rash  ? Penicillins Rash  ?  Has patient had  a PCN reaction causing immediate rash, facial/tongue/throat swelling, SOB or lightheadedness with hypotension: yes ?Has patient had a PCN reaction causing severe rash involving mucus membranes or skin necrosis: no ?Has patient had a PCN reaction that required hospitalization: no ?Has patient had a PCN reaction occurring within the last 10 years: no ?If all of the above answers are "NO", then may proceed with Cephalosporin use. ?  ? ?Objective:  ?There were no vitals filed for this visit. ?There is no height or weight on file to calculate BMI. ?Constitutional Well developed. ?Well nourished.  ?Vascular Dorsalis pedis pulses palpable bilaterally. ?Posterior tibial pulses palpable bilaterally. ?Capillary refill  normal to all digits.  ?No cyanosis or clubbing noted. ?Pedal hair growth normal.  ?Neurologic Normal speech. ?Oriented to person, place, and time. ?Epicritic sensation to light touch grossly present bilaterally.  ?Dermatologic Nails thickened elongated dystrophic mycotic discolored bilateral hallux.  Mild pain on palpation ?Skin within normal limits  ?Orthopedic: Normal joint ROM without pain or crepitus bilaterally. ?No visible deformities. ?No bony tenderness.  ? ?Radiographs: None ?Assessment:  ? ?1. Long-term use of high-risk medication   ?2. Nail fungus   ?3. Onychomycosis due to dermatophyte   ? ?Plan:  ?Patient was evaluated and treated and all questions answered. ? ?Bilateral hallux onychomycosis/nail fungus ?-Educated the patient on the etiology of onychomycosis and various treatment options associated with improving the fungal load.  I explained to the patient that there is 3 treatment options available to treat the onychomycosis including topical, p.o., laser treatment.  Patient elected to undergo p.o. options with Lamisil/terbinafine therapy.  In order for me to start the medication therapy, I explained to the patient the importance of evaluating the liver and obtaining the liver function test.  Once the liver function test comes back normal I will start him on 39-monthcourse of Lamisil therapy.  Patient understood all risk and would like to proceed with Lamisil therapy.  I have asked the patient to immediately stop the Lamisil therapy if she has any reactions to it and call the office or go to the emergency room right away.  Patient states understanding ? ? ?No follow-ups on file. ?

## 2021-06-12 LAB — HEPATIC FUNCTION PANEL
ALT: 23 IU/L (ref 0–32)
AST: 24 IU/L (ref 0–40)
Albumin: 4.1 g/dL (ref 3.8–4.9)
Alkaline Phosphatase: 82 IU/L (ref 44–121)
Bilirubin Total: 0.3 mg/dL (ref 0.0–1.2)
Bilirubin, Direct: <0.1 mg/dL (ref 0.00–0.40)
Total Protein: 6.8 g/dL (ref 6.0–8.5)

## 2021-06-12 MED ORDER — TERBINAFINE HCL 250 MG PO TABS: 250.0000 mg | ORAL_TABLET | Freq: Every day | ORAL | 0 refills | Status: DC

## 2021-06-12 NOTE — Addendum Note (Signed)
Addended by: Boneta Lucks on: 06/12/2021 02:25 PM ? ? Modules accepted: Orders ? ?

## 2021-06-13 ENCOUNTER — Telehealth: Payer: Self-pay | Admitting: *Deleted

## 2021-06-13 NOTE — Telephone Encounter (Signed)
I am calling you in regards to your visit.  I was informed that you had some concerns.  "Yes, I want to start from the beginning.  I called to schedule my appointment, I had a pleasant conversation.  I was told that you all have one chart because she knew all my information and I had never gone to a physician's office or the hospital in [place].  I have only gone to [facility] in [place].  So, when I got to your office.  The first thing said to me was we need to reschedule your appointment.  She didn't say good morning or have a smile on her face or anything.  I asked why.  She said I was late.  I told her that I got held up a little bit due to traffic.  My appointment was at 8:30.  The time was 8:45.  I have a 15 minute grace period so, I don't understand what the issue was.  She stepped away from the desk and spoke to a gentleman and someone else.  I don't know who he was, it wasn't my doctor.  She came back to the desk and said, "Don't make this a habit, we'll see you this time.  I didn't respond the way I wanted to because there were other people standing around.  She said, "Now I need your insurance cards."  I asked her why because I knew the information was already in there because I had been told you all are on one chart.  I gave her my cards.  About seven people had signed in before me.  There wasn't any sanitizing at the desk.  I asked for a tissue.  She went around the corner and got a tissue and came and handed it to me.  I didn't know if her hands were clean.  She didn't know what I needed it for.  I wanted the tissue because she wanted me to sign something on the pad and I didn't want to hold that pen because she had not sanitized it.  She sent me to the waiting area on the other side, no one was over there.  I didn't know if that's the waiting area for the doctor I was seeing or not.  She didn't tell me.  I was feeling fantastic until I got here and met, I think her name is Elmyra Ricks.  The nurse came and  got me, I think her name was Butch Penny.  She was fantastic.  Dr. Serita Grit bedside manner was great.  When I went to check out, you were very pleasant.  The only thing I didn't like about checkout is that a patient came up and stood too close.  I think people should stand at a distance not to invade others privacy.  Things are so open.  The visit overall was good but the the check-in experience was not pleasant.  You need someone up front that is more personable."  I apologized to her.  I informed her that all her concerns would be addressed.  I told her that I hope this incident would not deter her from coming back to see [place] in the future.  She stated, "I will be back to see Dr. Posey Pronto.  He was fantastic."   ?

## 2021-06-25 NOTE — Progress Notes (Signed)
? ? ? ? ?SUBJECTIVE: 56 y.o. new to me Previously followed by Dr Bronson Ing  She has a history of coronary artery disease and underwent 1-vessel CABG (SVG to LAD) in August 2012. This was for a complication of acute coronary dissection after attempted intervention ? ?She also has a history of hypertension. ?  ?Most recent echocardiogram I find is dated 11/21/10 and showed normal left ventricular systolic function and regional wall motion, LVEF 60-65%, mild mitral regurgitation, mild biatrial dilatation, mild right ventricular dilatation, and moderate tricuspid regurgitation, PA pressure 34 mmHg. ?  ?Nuclear stress test was normal on 01/09/2017, EF 69%. ? ?The patient denies any symptoms of chest pain, palpitations, shortness of breath, lightheadedness, dizziness, leg swelling, orthopnea, PND, and syncope. ? ?Significant weight gain during pandemic No angina  ? 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 ? ?Soc Hx: Married. 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 Lives in [place]. Has 2 children.  Her husband works for [employer].  She has a 2 year old daughter (2020) who is completed her masters in Environmental health practitioner and is now working for the [employer]  She has a 72 year old son (2020) who is a Equities trader in high school in [place] and taking advanced classes. ? ?Review of Systems: As per "subjective", otherwise negative. ? ?Allergies  ?Allergen Reactions  ? Morphine And Related Rash  ? Penicillins Rash  ?  Has patient had a PCN reaction causing immediate rash, facial/tongue/throat swelling, SOB or lightheadedness with hypotension: yes ?Has patient had a PCN reaction causing severe rash involving mucus membranes or skin necrosis: no ?Has patient had a PCN reaction that required hospitalization: no ?Has patient had a PCN reaction occurring within the last 10 years: no ?If all of the  above answers are "NO", then may proceed with Cephalosporin use. ?  ? ? ?Current Outpatient Medications  ?Medication Sig Dispense Refill  ? amLODipine (NORVASC) 10 MG tablet Take 10 mg by mouth daily.    ? aspirin EC 81 MG tablet Take 1 tablet (81 mg total) by mouth daily.    ? atorvastatin (LIPITOR) 20 MG tablet TAKE 1 TABLET DAILY 90 tablet 3  ? hydrocortisone (ANUSOL-HC) 2.5 % rectal cream Place 1 application rectally 2 (two) times daily as needed for hemorrhoids (up to twice a day for up to 10 days at a time). 30 g 2  ? latanoprost (XALATAN) 0.005 % ophthalmic solution Place 1 drop into both eyes at bedtime.  3  ? lisinopril-hydrochlorothiazide (PRINZIDE,ZESTORETIC) 20-12.5 MG tablet Take 1 tablet by mouth 2 (two) times daily.   3  ? metoprolol tartrate (LOPRESSOR) 25 MG tablet TAKE 1 & 1/2 TABLETS BY MOUTH TWICE A DAY (Patient taking differently: Take 37.5 mg by mouth 2 (two) times daily.) 270 tablet 3  ? metroNIDAZOLE (FLAGYL) 500 MG tablet Take 1 tablet (500 mg total) by mouth 2 (two) times daily. (Patient not taking: Reported on 04/04/2021) 14 tablet 0  ? metroNIDAZOLE (FLAGYL) 500 MG tablet Take 1 tablet (500 mg total) by mouth 2 (two) times daily. 14 tablet 0  ? Multiple Vitamin (MULITIVITAMIN WITH MINERALS) TABS Take 1 tablet by mouth daily.  (Patient not taking: Reported on 04/04/2021)    ? omeprazole (PRILOSEC) 40 MG capsule Take 40 mg by mouth daily as needed (reflux).  (Patient not taking: Reported on 04/04/2021)  3  ? polyethylene glycol powder (MIRALAX) powder Take  17 g by mouth daily. To prevent constipation (Patient not taking: Reported on 04/04/2021) 255 g prn  ? terbinafine (LAMISIL) 250 MG tablet Take 1 tablet (250 mg total) by mouth daily. 90 tablet 0  ? ?No current facility-administered medications for this visit.  ? ? ?Past Medical History:  ?Diagnosis Date  ? Angina   ? Asthma   ? "as a child"  ? Coronary artery dissection   ? NSTEMI 8/12: cath with mid to dist LAD spont. dissection, unable to do  PCI and referred for CABG:  SVG to LAD (Dr. PVT);  Carotid U/S 6/14:  Normal carotid arteries; FMD could not be ruled out  ? Coronary artery dissection 10/24/2010  ? Dyslipidemia   ? GERD (gastroesophageal reflux disease)   ? Heart disease   ? Hypertension   ? Iron deficiency anemia   ? Obesity   ? Papanicolaou smear of cervix with positive high risk human papilloma virus (HPV) test 04/11/2020  ? Pap negative but +HRHPV other, was negative for 16/18, repeat pap in 1 year per [facility] gudielines , 5 year CIN3+ risk is 2.25%  ? ? ?Past Surgical History:  ?Procedure Laterality Date  ? BILATERAL SALPINGECTOMY Bilateral 08/04/2017  ? Procedure: BILATERAL SALPINGECTOMY;  Surgeon: Jonnie Kind, MD;  Location: [facility];  Service: Gynecology;  Laterality: Bilateral;  ? CARDIAC SURGERY    ? open heart surgery  ? CERVICAL POLYPECTOMY  07/01/2011  ? Procedure: CERVICAL POLYPECTOMY;  Surgeon: Jonnie Kind, MD;  Location: [facility];  Service: Gynecology;  Laterality: N/A;  Endometrial Polypectomy  ? COLONOSCOPY N/A 02/23/2019  ? Procedure: COLONOSCOPY;  Surgeon: Danie Binder, MD;  Location: [facility];  Service: Endoscopy;  Laterality: N/A;  8:30  ? CORONARY ARTERY BYPASS GRAFT  10/24/2010  ? CABG X1:  coronary artery dissection -->CABG:  SVG to LAD (Dr. PVT)  ? DILATION AND CURETTAGE OF UTERUS    ? HYSTEROSCOPY W/ ENDOMETRIAL ABLATION    ? LAPAROSCOPIC TUBAL LIGATION  07/01/2011  ? Procedure: LAPAROSCOPIC TUBAL LIGATION;  Surgeon: Jonnie Kind, MD;  Location: [facility];  Service: Gynecology;  Laterality: Bilateral;  Laparoscopic Bilateral Tubal Sterilization with Fallope Rings; ended at 1143  ? POLYPECTOMY  02/23/2019  ? Procedure: POLYPECTOMY;  Surgeon: Danie Binder, MD;  Location: [facility];  Service: Endoscopy;;  cold snare cecal, Hepatic flexure, Splenic flexure, and sigmoid polyps  ? SUPRACERVICAL ABDOMINAL HYSTERECTOMY N/A 08/04/2017  ? Procedure: HYSTERECTOMY SUPRACERVICAL ABDOMINAL;  Surgeon: Jonnie Kind, MD;   Location: [facility];  Service: Gynecology;  Laterality: N/A;  ? TUBAL LIGATION    ? UMBILICAL HERNIA REPAIR  07/01/2011  ? Procedure: HERNIA REPAIR UMBILICAL ADULT;  Surgeon: Jonnie Kind, MD;  Location: [facility];  Service: Gynecology;  Laterality: N/A;  during Laparoscopic Bilateral Tubal Sterilization  ? VENTRAL HERNIA REPAIR N/A 08/04/2017  ? Procedure: HERNIA REPAIR VENTRAL ADULT;  Surgeon: Jonnie Kind, MD;  Location: [facility];  Service: Gynecology;  Laterality: N/A;  ? ? ?Social History  ? ?Socioeconomic History  ? Marital status: Divorced  ?  Spouse name: Not on file  ? Number of children: Not on file  ? Years of education: Not on file  ? Highest education level: Not on file  ?Occupational History  ? Not on file  ?Tobacco Use  ? Smoking status: Never  ? Smokeless tobacco: Never  ?Vaping Use  ? Vaping Use: Never used  ?Substance and Sexual Activity  ? Alcohol use: No  ?  Alcohol/week: 0.0 standard drinks  ? Drug use: No  ? Sexual activity: Yes  ?  Birth control/protection: Surgical  ?  Comment: supracervical hyst  ?Other Topics Concern  ? Not on file  ?Social History Narrative  ? Not on file  ? ?Social Determinants of Health  ? ?Financial Resource Strain: Low Risk   ? Difficulty of Paying Living Expenses: Not very hard  ?Food Insecurity: No Food Insecurity  ? Worried About Charity fundraiser in the Last Year: Never true  ? Ran Out of Food in the Last Year: Never true  ?Transportation Needs: No Transportation Needs  ? Lack of Transportation (Medical): No  ? Lack of Transportation (Non-Medical): No  ?Physical Activity: Sufficiently Active  ? Days of Exercise per Week: 3 days  ? Minutes of Exercise per Session: 60 min  ?Stress: No Stress Concern Present  ? Feeling of Stress : Not at all  ?Social Connections: Unknown  ? Frequency of Communication with Friends and Family: More than three times a week  ? Frequency of Social Gatherings with Friends and Family: Twice a week  ? Attends Religious Services: More than 4  times per year  ? Active Member of Clubs or Organizations: Yes  ? Attends Archivist Meetings: More than 4 times per year  ? Marital Status: Patient refused  ?Intimate Partner Violence: Not At Risk

## 2021-07-08 ENCOUNTER — Encounter: Payer: Self-pay | Admitting: Cardiovascular Disease

## 2021-07-08 ENCOUNTER — Ambulatory Visit (INDEPENDENT_AMBULATORY_CARE_PROVIDER_SITE_OTHER): Payer: BC Managed Care – PPO | Admitting: Cardiovascular Disease

## 2021-07-08 VITALS — BP 120/82 | HR 65 | Ht 67.0 in | Wt 289.4 lb

## 2021-07-08 DIAGNOSIS — I2510 Atherosclerotic heart disease of native coronary artery without angina pectoris: Secondary | ICD-10-CM | POA: Diagnosis not present

## 2021-07-08 DIAGNOSIS — I252 Old myocardial infarction: Secondary | ICD-10-CM

## 2021-07-08 MED ORDER — AMLODIPINE BESYLATE 10 MG PO TABS: 10.0000 mg | ORAL_TABLET | Freq: Every day | ORAL | 3 refills | Status: AC

## 2021-07-08 NOTE — Patient Instructions (Signed)
Medication Instructions:  Your physician recommends that you continue on your current medications as directed. Please refer to the Current Medication list given to you today.  *If you need a refill on your cardiac medications before your next appointment, please call your pharmacy*  Lab Work: If you have labs (blood work) drawn today and your tests are completely normal, you will receive your results only by: MyChart Message (if you have MyChart) OR A paper copy in the mail If you have any lab test that is abnormal or we need to change your treatment, we will call you to review the results.  Testing/Procedures: None ordered today.  Follow-Up: At [facility], you and your health needs are our priority.  As part of our continuing mission to provide you with exceptional heart care, we have created designated Provider Care Teams.  These Care Teams include your primary Cardiologist (physician) and Advanced Practice Providers (APPs -  Physician Assistants and Nurse Practitioners) who all work together to provide you with the care you need, when you need it.  We recommend signing up for the patient portal called "MyChart".  Sign up information is provided on this After Visit Summary.  MyChart is used to connect with patients for Virtual Visits (Telemedicine).  Patients are able to view lab/test results, encounter notes, upcoming appointments, etc.  Non-urgent messages can be sent to your provider as well.   To learn more about what you can do with MyChart, go to [email or web address].    Your next appointment:   12 month(s)  The format for your next appointment:   In Person  Provider:   Peter Nishan, MD {   Important Information About Sugar       

## 2021-07-10 NOTE — Addendum Note (Signed)
Addended by: Jacinta Shoe on: 07/10/2021 10:04 AM ? ? Modules accepted: Orders ? ?

## 2021-07-30 ENCOUNTER — Ambulatory Visit (INDEPENDENT_AMBULATORY_CARE_PROVIDER_SITE_OTHER): Payer: BC Managed Care – PPO | Admitting: Podiatry

## 2021-07-30 ENCOUNTER — Ambulatory Visit (INDEPENDENT_AMBULATORY_CARE_PROVIDER_SITE_OTHER): Payer: BC Managed Care – PPO

## 2021-07-30 DIAGNOSIS — M722 Plantar fascial fibromatosis: Secondary | ICD-10-CM

## 2021-07-30 DIAGNOSIS — M7732 Calcaneal spur, left foot: Secondary | ICD-10-CM | POA: Diagnosis not present

## 2021-07-30 NOTE — Progress Notes (Signed)
Subjective:  Patient ID: Ashlee Stein, female    DOB: [birth year],  MRN: [id number]  Chief Complaint  Patient presents with   Foot Pain    56 y.o. female presents with the above complaint.  Patient presents with complaint of left heel pain.  Patient states started last month is progressive gotten worse is in the center of the heel.  She feels like she stepped on something.  Hurts with taking for step in the morning.  She has not seen anyone else prior to seeing me.  She denies any other acute complaints.   Review of Systems: Negative except as noted in the HPI. Denies N/V/F/Ch.  Past Medical History:  Diagnosis Date   Angina    Asthma    "as a child"   Coronary artery dissection    NSTEMI 8/12: cath with mid to dist LAD spont. dissection, unable to do PCI and referred for CABG:  SVG to LAD (Dr. PVT);  Carotid U/S 6/14:  Normal carotid arteries; FMD could not be ruled out   Coronary artery dissection 10/24/2010   Dyslipidemia    GERD (gastroesophageal reflux disease)    Heart disease    Hypertension    Iron deficiency anemia    Obesity    Papanicolaou smear of cervix with positive high risk human papilloma virus (HPV) test 04/11/2020   Pap negative but +HRHPV other, was negative for 16/18, repeat pap in 1 year per [facility] gudielines , 5 year CIN3+ risk is 2.25%    Current Outpatient Medications:    amLODipine (NORVASC) 10 MG tablet, Take 1 tablet (10 mg total) by mouth daily., Disp: 90 tablet, Rfl: 3   aspirin EC 81 MG tablet, Take 1 tablet (81 mg total) by mouth daily., Disp: , Rfl:    atorvastatin (LIPITOR) 20 MG tablet, TAKE 1 TABLET DAILY, Disp: 90 tablet, Rfl: 3   hydrocortisone (ANUSOL-HC) 2.5 % rectal cream, Place 1 application rectally 2 (two) times daily as needed for hemorrhoids (up to twice a day for up to 10 days at a time)., Disp: 30 g, Rfl: 2   latanoprost (XALATAN) 0.005 % ophthalmic solution, Place 1 drop into both eyes at bedtime., Disp: , Rfl: 3    lisinopril-hydrochlorothiazide (PRINZIDE,ZESTORETIC) 20-12.5 MG tablet, Take 1 tablet by mouth 2 (two) times daily. , Disp: , Rfl: 3   metoprolol tartrate (LOPRESSOR) 25 MG tablet, TAKE 1 & 1/2 TABLETS BY MOUTH TWICE A DAY (Patient taking differently: Take 37.5 mg by mouth 2 (two) times daily.), Disp: 270 tablet, Rfl: 3   metroNIDAZOLE (FLAGYL) 500 MG tablet, Take 1 tablet (500 mg total) by mouth 2 (two) times daily. (Patient not taking: Reported on 04/04/2021), Disp: 14 tablet, Rfl: 0   metroNIDAZOLE (FLAGYL) 500 MG tablet, Take 1 tablet (500 mg total) by mouth 2 (two) times daily., Disp: 14 tablet, Rfl: 0   Multiple Vitamin (MULITIVITAMIN WITH MINERALS) TABS, Take 1 tablet by mouth daily.  (Patient not taking: Reported on 04/04/2021), Disp: , Rfl:    omeprazole (PRILOSEC) 40 MG capsule, Take 40 mg by mouth daily as needed (reflux).  (Patient not taking: Reported on 04/04/2021), Disp: , Rfl: 3   polyethylene glycol powder (MIRALAX) powder, Take 17 g by mouth daily. To prevent constipation (Patient not taking: Reported on 04/04/2021), Disp: 255 g, Rfl: prn   terbinafine (LAMISIL) 250 MG tablet, Take 1 tablet (250 mg total) by mouth daily., Disp: 90 tablet, Rfl: 0  Social History   Tobacco Use  Smoking Status Never  Smokeless Tobacco Never    Allergies  Allergen Reactions   Morphine And Related Rash   Penicillins Rash    Has patient had a PCN reaction causing immediate rash, facial/tongue/throat swelling, SOB or lightheadedness with hypotension: yes Has patient had a PCN reaction causing severe rash involving mucus membranes or skin necrosis: no Has patient had a PCN reaction that required hospitalization: no Has patient had a PCN reaction occurring within the last 10 years: no If all of the above answers are "NO", then may proceed with Cephalosporin use.    Objective:  There were no vitals filed for this visit. There is no height or weight on file to calculate BMI. Constitutional Well  developed. Well nourished.  Vascular Dorsalis pedis pulses palpable bilaterally. Posterior tibial pulses palpable bilaterally. Capillary refill normal to all digits.  No cyanosis or clubbing noted. Pedal hair growth normal.  Neurologic Normal speech. Oriented to person, place, and time. Epicritic sensation to light touch grossly present bilaterally.  Dermatologic Nails well groomed and normal in appearance. No open wounds. No skin lesions.  Orthopedic: Normal joint ROM without pain or crepitus bilaterally. No visible deformities. Tender to palpation at the calcaneal tuber left. No pain with calcaneal squeeze left. Ankle ROM diminished range of motion left. Silfverskiold Test: Positive left   Radiographs: Taken and reviewed. No acute fractures or dislocations. No evidence of stress fracture.  Plantar heel spur absent. Posterior heel spur present.  Pes planovalgus foot structure  Assessment:   1. Plantar fasciitis, left   2. Heel spur, left    Plan:  Patient was evaluated and treated and all questions answered.  Plantar Fasciitis, left with underlying heel spur - XR reviewed as above.  - Educated on icing and stretching. Instructions given.  - Injection delivered to the plantar fascia as below. - DME: Plantar fascial brace dispensed to support the medial longitudinal arch of the foot and offload pressure from the heel and prevent arch collapse during weightbearing - Pharmacologic management: None  Procedure: Injection Tendon/Ligament Location: Left plantar fascia at the glabrous junction; medial approach. Skin Prep: alcohol Injectate: 0.5 cc 0.5% marcaine plain, 0.5 cc of 1% Lidocaine, 0.5 cc kenalog 10. Disposition: Patient tolerated procedure well. Injection site dressed with a band-aid.  No follow-ups on file.

## 2021-08-29 ENCOUNTER — Ambulatory Visit: Payer: BC Managed Care – PPO | Admitting: Podiatry

## 2021-09-16 ENCOUNTER — Other Ambulatory Visit: Payer: Self-pay | Admitting: Podiatry

## 2021-10-01 ENCOUNTER — Ambulatory Visit
Admission: EM | Admit: 2021-10-01 | Discharge: 2021-10-01 | Discharge status: Against Medical Advice | Payer: BC Managed Care – PPO

## 2021-10-01 ENCOUNTER — Other Ambulatory Visit: Payer: Self-pay | Admitting: Gerontology

## 2021-10-01 ENCOUNTER — Other Ambulatory Visit (HOSPITAL_COMMUNITY): Payer: Self-pay | Admitting: Gerontology

## 2021-10-01 DIAGNOSIS — R600 Localized edema: Secondary | ICD-10-CM

## 2021-10-02 ENCOUNTER — Other Ambulatory Visit: Payer: Self-pay | Admitting: Podiatry

## 2021-10-03 ENCOUNTER — Ambulatory Visit (HOSPITAL_COMMUNITY)
Admission: RE | Admit: 2021-10-03 | Discharge: 2021-10-03 | Disposition: A | Discharge status: Home / Self Care | Payer: BC Managed Care – PPO | Source: Ambulatory Visit | Attending: Gerontology | Admitting: Gerontology

## 2021-10-03 DIAGNOSIS — R600 Localized edema: Secondary | ICD-10-CM | POA: Insufficient documentation

## 2021-10-10 ENCOUNTER — Encounter: Payer: BC Managed Care – PPO | Admitting: Podiatry

## 2021-12-03 ENCOUNTER — Ambulatory Visit
Admission: EM | Admit: 2021-12-03 | Discharge: 2021-12-03 | Disposition: A | Discharge status: Home / Self Care | Payer: BC Managed Care – PPO | Attending: Physician Assistant | Admitting: Physician Assistant

## 2021-12-03 DIAGNOSIS — L03031 Cellulitis of right toe: Secondary | ICD-10-CM | POA: Diagnosis not present

## 2021-12-03 DIAGNOSIS — L03032 Cellulitis of left toe: Secondary | ICD-10-CM

## 2021-12-03 MED ORDER — CEPHALEXIN 500 MG PO CAPS: 500.0000 mg | ORAL_CAPSULE | Freq: Two times a day (BID) | ORAL | 0 refills | Status: AC

## 2021-12-03 MED ORDER — MUPIROCIN 2 % EX OINT: 1.0000 | TOPICAL_OINTMENT | Freq: Two times a day (BID) | CUTANEOUS | 0 refills | Status: DC

## 2021-12-03 NOTE — ED Triage Notes (Addendum)
Pt c/o Issue on both big toes x3weeks  Pt states that she has had a pedicure done but feels that they went too deep along the edge of her toes.   Pt has swelling along both big toes and states that they are tender to touch.   The medial side of both toes are Taut and red. Pt states that she does have issues walking and believes it is due to plantar fascitis and not her toe pain.

## 2021-12-03 NOTE — ED Provider Notes (Signed)
[facility]    CSN: [id number] Arrival date & time: 12/03/21  1841      History   Chief Complaint Chief Complaint  Patient presents with   Toe Pain    HPI Ashlee Stein is a 56 y.o. female presenting for swelling and redness of the cuticles of bilateral great toes for the past 2 to 3 weeks.  Patient says it started out tingling after she had a pedicure.  She says they are sore.  She has not noticed any drainage.  She denies any fever.  She reports that her feet already hurt because she has plantar fasciitis.  Has not really been treating condition in any way except try to keep her feet clean.  No other complaints.  HPI  Past Medical History:  Diagnosis Date   Angina    Asthma    "as a child"   Coronary artery dissection    NSTEMI 8/12: cath with mid to dist LAD spont. dissection, unable to do PCI and referred for CABG:  SVG to LAD (Dr. PVT);  Carotid U/S 6/14:  Normal carotid arteries; FMD could not be ruled out   Coronary artery dissection 10/24/2010   Dyslipidemia    GERD (gastroesophageal reflux disease)    Heart disease    Hypertension    Iron deficiency anemia    Obesity    Papanicolaou smear of cervix with positive high risk human papilloma virus (HPV) test 04/11/2020   Pap negative but +HRHPV other, was negative for 16/18, repeat pap in 1 year per [facility] gudielines , 5 year CIN3+ risk is 2.25%    Patient Active Problem List   Diagnosis Date Noted   Papanicolaou smear of cervix with positive high risk human papilloma virus (HPV) test 04/11/2020   Encounter for gynecological examination with Papanicolaou smear of cervix 04/03/2020   Encounter for screening fecal occult blood testing 04/03/2020   Vitiligo 64/40/3474   Umbilical hernia without obstruction and without gangrene 04/03/2020   Ventral hernia without obstruction or gangrene 04/03/2020   Hemorrhoids 06/14/2019   Rectal bleeding 06/14/2019   Special screening for malignant neoplasms, colon  10/25/2018   S/P abdominal supracervical subtotal hysterectomy 08/04/2017   Intramural and submucous leiomyoma of uterus 06/03/2017   Diastasis recti 03/13/2017   Epigastric hernia 03/13/2017   Morbid obesity ([place]) 08/24/2015   Dyspareunia 25/95/6387   Umbilical hernia 56/43/3295   Menorrhagia 06/29/2011   Anemia 01/07/2011   Cough 12/06/2010   Chest pain 11/14/2010   Coronary artery dissection    Dyslipidemia    Hypertension     Past Surgical History:  Procedure Laterality Date   BILATERAL SALPINGECTOMY Bilateral 08/04/2017   Procedure: BILATERAL SALPINGECTOMY;  Surgeon: Jonnie Kind, MD;  Location: [facility];  Service: Gynecology;  Laterality: Bilateral;   CARDIAC SURGERY     open heart surgery   CERVICAL POLYPECTOMY  07/01/2011   Procedure: CERVICAL POLYPECTOMY;  Surgeon: Jonnie Kind, MD;  Location: [facility];  Service: Gynecology;  Laterality: N/A;  Endometrial Polypectomy   COLONOSCOPY N/A 02/23/2019   Procedure: COLONOSCOPY;  Surgeon: Danie Binder, MD;  [facility];  Service: Endoscopy;  Laterality: N/A;  8:30   CORONARY ARTERY BYPASS GRAFT  10/24/2010   CABG X1:  coronary artery dissection -->CABG:  SVG to LAD (Dr. PVT)   DILATION AND CURETTAGE OF UTERUS     HYSTEROSCOPY W/ ENDOMETRIAL ABLATION     LAPAROSCOPIC TUBAL LIGATION  07/01/2011   Procedure: LAPAROSCOPIC TUBAL LIGATION;  Surgeon: Jonnie Kind, MD;  Location: [facility];  Service: Gynecology;  Laterality: Bilateral;  Laparoscopic Bilateral Tubal Sterilization with Fallope Rings; ended at 70   POLYPECTOMY  02/23/2019   Procedure: POLYPECTOMY;  Surgeon: Danie Binder, MD;  [facility]: [facility];  Service: Endoscopy;;  cold snare cecal, Hepatic flexure, Splenic flexure, and sigmoid polyps   SUPRACERVICAL ABDOMINAL HYSTERECTOMY N/A 08/04/2017   Procedure: HYSTERECTOMY SUPRACERVICAL ABDOMINAL;  Surgeon: Jonnie Kind, MD;  Location: [facility];  Service: Gynecology;  Laterality: N/A;   TUBAL LIGATION      UMBILICAL HERNIA REPAIR  07/01/2011   Procedure: HERNIA REPAIR UMBILICAL ADULT;  Surgeon: Jonnie Kind, MD;  Location: [facility];  Service: Gynecology;  Laterality: N/A;  during Laparoscopic Bilateral Tubal Sterilization   VENTRAL HERNIA REPAIR N/A 08/04/2017   Procedure: HERNIA REPAIR VENTRAL ADULT;  Surgeon: Jonnie Kind, MD;  Location: [facility];  Service: Gynecology;  Laterality: N/A;    OB History     Gravida  2   Para  2   Term  2   Preterm      AB      Living  2      SAB      IAB      Ectopic      Multiple      Live Births  2            Home Medications    Prior to Admission medications   Medication Sig Start Date End Date Taking? Authorizing Provider  amLODipine (NORVASC) 10 MG tablet Take 1 tablet (10 mg total) by mouth daily. 07/08/21  Yes Josue Hector, MD  aspirin EC 81 MG tablet Take 1 tablet (81 mg total) by mouth daily. 12/06/10  Yes Hillary Bow, MD  atorvastatin (LIPITOR) 20 MG tablet TAKE 1 TABLET DAILY 05/02/19  Yes Herminio Commons, MD  cephALEXin (KEFLEX) 500 MG capsule Take 1 capsule (500 mg total) by mouth 2 (two) times daily for 7 days. 12/03/21 12/10/21 Yes Laurene Footman B, PA-C  latanoprost (XALATAN) 0.005 % ophthalmic solution Place 1 drop into both eyes at bedtime. 05/24/17  Yes [provider]  lisinopril-hydrochlorothiazide (PRINZIDE,ZESTORETIC) 20-12.5 MG tablet Take 1 tablet by mouth 2 (two) times daily.  01/14/15  Yes [provider]  metoprolol tartrate (LOPRESSOR) 25 MG tablet TAKE 1 & 1/2 TABLETS BY MOUTH TWICE A DAY Patient taking differently: Take 37.5 mg by mouth 2 (two) times daily. 09/20/15  Yes Herminio Commons, MD  mupirocin ointment (BACTROBAN) 2 % Apply 1 Application topically 2 (two) times daily. 12/03/21  Yes Danton Clap, PA-C  Multiple Vitamin (MULITIVITAMIN WITH MINERALS) TABS Take 1 tablet by mouth daily.  Patient not taking: Reported on 04/04/2021    [provider]  omeprazole  (PRILOSEC) 40 MG capsule Take 40 mg by mouth daily as needed (reflux).  Patient not taking: Reported on 04/04/2021 09/03/16   [provider]  polyethylene glycol powder (MIRALAX) powder Take 17 g by mouth daily. To prevent constipation Patient not taking: Reported on 04/04/2021 08/06/17   Jonnie Kind, MD  terbinafine (LAMISIL) 250 MG tablet Take 1 tablet (250 mg total) by mouth daily. 06/12/21   Felipa Furnace, DPM    Family History Family History  Problem Relation Age of Onset   Heart attack Sister    Diabetes Mother    Hypertension Mother    Heart disease Mother    Cancer Brother 63  prostate   Diabetes Sister    Hypertension Sister    Hypertension Sister    Colon cancer Neg Hx     Social History Social History   Tobacco Use   Smoking status: Never   Smokeless tobacco: Never  Vaping Use   Vaping Use: Never used  Substance Use Topics   Alcohol use: No    Alcohol/week: 0.0 standard drinks of alcohol   Drug use: No     Allergies   Morphine and related and Penicillins   Review of Systems Review of Systems  Constitutional:  Negative for fatigue and fever.  Musculoskeletal:  Positive for arthralgias, gait problem and joint swelling.  Skin:  Positive for color change. Negative for wound.  Neurological:  Negative for weakness and numbness.     Physical Exam Triage Vital Signs ED Triage Vitals  Enc Vitals Group     BP 12/03/21 1921 121/73     Pulse Rate 12/03/21 1921 73     Resp 12/03/21 1921 18     Temp 12/03/21 1921 98.4 F (36.9 C)     Temp Source 12/03/21 1921 Oral     SpO2 12/03/21 1921 96 %     Weight 12/03/21 1917 280 lb (127 kg)     Height 12/03/21 1917 '5\' 7"'$  (1.702 m)     Head Circumference --      Peak Flow --      Pain Score 12/03/21 1916 6     Pain Loc --      Pain Edu? --      Excl. in [place]? --    No data found.  Updated Vital Signs BP 121/73 (BP Location: Left Arm)   Pulse 73   Temp 98.4 F (36.9 C) (Oral)   Resp 18   Ht  '5\' 7"'$  (1.702 m)   Wt 280 lb (127 kg)   LMP 07/23/2017 (Approximate) Comment: [place]  SpO2 96%   BMI 43.85 kg/m       Physical Exam Vitals and nursing note reviewed.  Constitutional:      General: She is not in acute distress.    Appearance: Normal appearance. She is not ill-appearing or toxic-appearing.  HENT:     Head: Normocephalic and atraumatic.  Eyes:     General: No scleral icterus.       Right eye: No discharge.        Left eye: No discharge.     Conjunctiva/sclera: Conjunctivae normal.  Cardiovascular:     Rate and Rhythm: Normal rate.     Pulses: Normal pulses.  Pulmonary:     Effort: Pulmonary effort is normal. No respiratory distress.  Musculoskeletal:     Cervical back: Neck supple.  Skin:    General: Skin is dry.     Comments: There is erythema and mild swelling of the medial cuticle/nail folds of bilateral great toes.  No ingrown toenails noted.  Area is diffusely tender of the cuticles especially on the left great toe.  Neurological:     General: No focal deficit present.     Mental Status: She is alert. Mental status is at baseline.     Motor: No weakness.     Gait: Gait normal.  Psychiatric:        Mood and Affect: Mood normal.        Behavior: Behavior normal.        Thought Content: Thought content normal.      UC Treatments / Results  Labs (all  labs ordered are listed, but only abnormal results are displayed) Labs Reviewed - No data to display  EKG   Radiology No results found.  Procedures Procedures (including critical care time)  Medications Ordered in UC Medications - No data to display  Initial Impression / Assessment and Plan / UC Course  I have reviewed the triage vital signs and the nursing notes.  Pertinent labs & imaging results that were available during my care of the patient were reviewed by me and considered in my medical decision making (see chart for details).   56 year old female presenting for erythema and swelling of  bilateral great toe cuticles for the past few weeks after getting a pedicure.  On examination she has mild paronychia of bilateral great toes.  There is no evidence of an ingrown toenail.  We will treat at this time with Keflex and mupirocin ointment.  Discussed soaking feet.  Advised following up if no improvement over the next week or symptoms worsen.   Final Clinical Impressions(s) / UC Diagnoses   Final diagnoses:  Paronychia of toe of both feet     Discharge Instructions      -You do not have an ingrown toenail. - You have a minor cuticle infection.  I sent antibiotics to the pharmacy as well as an ointment. - You may soak your feet in warm water with or without Epsom salt and then apply the ointment afterwards.  This should be looking better over the next week. - If symptoms or not improving or if they are worsening over the next week, please return or follow-up with PCP for reevaluation.     ED Prescriptions     Medication Sig Dispense Auth. Provider   cephALEXin (KEFLEX) 500 MG capsule Take 1 capsule (500 mg total) by mouth 2 (two) times daily for 7 days. 14 capsule Laurene Footman B, PA-C   mupirocin ointment (BACTROBAN) 2 % Apply 1 Application topically 2 (two) times daily. 22 g Danton Clap, PA-C      PDMP not reviewed this encounter.   Danton Clap, PA-C 12/03/21 1951

## 2021-12-03 NOTE — Discharge Instructions (Signed)
-  You do not have an ingrown toenail. - You have a minor cuticle infection.  I sent antibiotics to the pharmacy as well as an ointment. - You may soak your feet in warm water with or without Epsom salt and then apply the ointment afterwards.  This should be looking better over the next week. - If symptoms or not improving or if they are worsening over the next week, please return or follow-up with PCP for reevaluation.

## 2021-12-13 ENCOUNTER — Encounter: Payer: Self-pay | Admitting: Cardiovascular Disease

## 2021-12-13 ENCOUNTER — Ambulatory Visit: Payer: BC Managed Care – PPO | Admitting: Cardiovascular Disease

## 2021-12-13 ENCOUNTER — Ambulatory Visit: Payer: BC Managed Care – PPO | Attending: Cardiovascular Disease | Admitting: Cardiovascular Disease

## 2021-12-13 VITALS — BP 118/74 | HR 74 | Ht 67.5 in | Wt 298.0 lb

## 2021-12-13 DIAGNOSIS — Z951 Presence of aortocoronary bypass graft: Secondary | ICD-10-CM | POA: Diagnosis not present

## 2021-12-13 DIAGNOSIS — I2510 Atherosclerotic heart disease of native coronary artery without angina pectoris: Secondary | ICD-10-CM

## 2021-12-13 DIAGNOSIS — I10 Essential (primary) hypertension: Secondary | ICD-10-CM

## 2021-12-13 DIAGNOSIS — E782 Mixed hyperlipidemia: Secondary | ICD-10-CM

## 2021-12-13 DIAGNOSIS — I252 Old myocardial infarction: Secondary | ICD-10-CM

## 2021-12-13 NOTE — Progress Notes (Signed)
SUBJECTIVE: 56 y.o. new to me Previously followed by Dr Bronson Ing  She has a history of coronary artery disease and underwent 1-vessel CABG (SVG to LAD) in August 2012. This was for a complication of acute coronary dissection after attempted intervention  She also has a history of hypertension.   Most recent echocardiogram I find is dated 11/21/10 and showed normal left ventricular systolic function and regional wall motion, LVEF 60-65%, mild mitral regurgitation, mild biatrial dilatation, mild right ventricular dilatation, and moderate tricuspid regurgitation, PA pressure 34 mmHg.   Nuclear stress test was normal on 01/09/2017, EF 69%.  The patient denies any symptoms of chest pain, palpitations, shortness of breath, lightheadedness, dizziness, leg swelling, orthopnea, PND, and syncope.  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 Lives in [place]. Has 2 children.  Her husband works for [employer].  She has a 45 year old daughter (2020) who is completed her masters in Environmental health practitioner and is now working for the [employer]  She has a 73 year old son (2020) who is a Equities trader in high school in [place] and taking advanced classes.  Review of Systems: As per "subjective", otherwise negative.  Allergies  Allergen Reactions   Morphine And Related Rash   Penicillins Rash    Has patient had a PCN reaction causing immediate rash, facial/tongue/throat swelling, SOB or lightheadedness with hypotension: yes Has patient had a PCN reaction causing severe rash involving mucus membranes or skin necrosis:  no Has patient had a PCN reaction that required hospitalization: no Has patient had a PCN reaction occurring within the last 10 years: no If all of the above answers are "NO", then may proceed with Cephalosporin use.     Current Outpatient Medications  Medication Sig Dispense Refill   amLODipine (NORVASC) 10 MG tablet Take 1 tablet (10 mg total) by mouth daily. 90 tablet 3   aspirin EC 81 MG tablet Take 1 tablet (81 mg total) by mouth daily.     atorvastatin (LIPITOR) 20 MG tablet TAKE 1 TABLET DAILY 90 tablet 3   latanoprost (XALATAN) 0.005 % ophthalmic solution Place 1 drop into both eyes at bedtime.  3   lisinopril-hydrochlorothiazide (PRINZIDE,ZESTORETIC) 20-12.5 MG tablet Take 1 tablet by mouth 2 (two) times daily.   3   metoprolol tartrate (LOPRESSOR) 25 MG tablet TAKE 1 & 1/2 TABLETS BY MOUTH TWICE A DAY (Patient taking differently: Take 37.5 mg by mouth 2 (two) times daily.) 270 tablet 3   Multiple Vitamin (MULITIVITAMIN WITH MINERALS) TABS Take 1 tablet by mouth daily.  (Patient not taking: Reported on 04/04/2021)     mupirocin ointment (BACTROBAN) 2 % Apply 1 Application topically 2 (two) times daily. 22 g 0   omeprazole (PRILOSEC) 40 MG capsule Take 40 mg by mouth daily as needed (reflux).  (Patient not taking: Reported on 04/04/2021)  3   polyethylene glycol powder (MIRALAX) powder Take 17 g by mouth daily. To prevent constipation (Patient not taking: Reported on 04/04/2021) 255 g prn   terbinafine (LAMISIL) 250 MG tablet Take 1 tablet (250 mg total) by  mouth daily. 90 tablet 0   No current facility-administered medications for this visit.    Past Medical History:  Diagnosis Date   Angina    Asthma    "as a child"   Coronary artery dissection    NSTEMI 8/12: cath with mid to dist LAD spont. dissection, unable to do PCI and referred for CABG:  SVG to LAD (Dr. PVT);  Carotid U/S 6/14:  Normal carotid arteries; FMD could not be ruled out   Coronary artery dissection 10/24/2010    Dyslipidemia    GERD (gastroesophageal reflux disease)    Heart disease    Hypertension    Iron deficiency anemia    Obesity    Papanicolaou smear of cervix with positive high risk human papilloma virus (HPV) test 04/11/2020   Pap negative but +HRHPV other, was negative for 16/18, repeat pap in 1 year per [facility] gudielines , 5 year CIN3+ risk is 2.25%    Past Surgical History:  Procedure Laterality Date   BILATERAL SALPINGECTOMY Bilateral 08/04/2017   Procedure: BILATERAL SALPINGECTOMY;  Surgeon: Jonnie Kind, MD;  Location: [facility];  Service: Gynecology;  Laterality: Bilateral;   CARDIAC SURGERY     open heart surgery   CERVICAL POLYPECTOMY  07/01/2011   Procedure: CERVICAL POLYPECTOMY;  Surgeon: Jonnie Kind, MD;  Location: [facility];  Service: Gynecology;  Laterality: N/A;  Endometrial Polypectomy   COLONOSCOPY N/A 02/23/2019   Procedure: COLONOSCOPY;  Surgeon: Danie Binder, MD;  [facility];  Service: Endoscopy;  Laterality: N/A;  8:30   CORONARY ARTERY BYPASS GRAFT  10/24/2010   CABG X1:  coronary artery dissection -->CABG:  SVG to LAD (Dr. PVT)   DILATION AND CURETTAGE OF UTERUS     HYSTEROSCOPY W/ ENDOMETRIAL ABLATION     LAPAROSCOPIC TUBAL LIGATION  07/01/2011   Procedure: LAPAROSCOPIC TUBAL LIGATION;  Surgeon: Jonnie Kind, MD;  Location: [facility];  Service: Gynecology;  Laterality: Bilateral;  Laparoscopic Bilateral Tubal Sterilization with Fallope Rings; ended at 65   POLYPECTOMY  02/23/2019   Procedure: POLYPECTOMY;  Surgeon: Danie Binder, MD;  [facility];  Service: Endoscopy;;  cold snare cecal, Hepatic flexure, Splenic flexure, and sigmoid polyps   SUPRACERVICAL ABDOMINAL HYSTERECTOMY N/A 08/04/2017   Procedure: HYSTERECTOMY SUPRACERVICAL ABDOMINAL;  Surgeon: Jonnie Kind, MD;  Location: [facility];  Service: Gynecology;  Laterality: N/A;   TUBAL LIGATION     UMBILICAL HERNIA REPAIR  07/01/2011   Procedure: HERNIA REPAIR UMBILICAL ADULT;   Surgeon: Jonnie Kind, MD;  Location: [facility];  Service: Gynecology;  Laterality: N/A;  during Laparoscopic Bilateral Tubal Sterilization   VENTRAL HERNIA REPAIR N/A 08/04/2017   Procedure: HERNIA REPAIR VENTRAL ADULT;  Surgeon: Jonnie Kind, MD;  Location: [facility];  Service: Gynecology;  Laterality: N/A;    Social History   Socioeconomic History   Marital status: Divorced    Spouse name: Not on file   Number of children: Not on file   Years of education: Not on file   Highest education level: Not on file  Occupational History   Not on file  Tobacco Use   Smoking status: Never   Smokeless tobacco: Never  Vaping Use   Vaping Use: Never used  Substance and Sexual Activity   Alcohol use: No    Alcohol/week: 0.0 standard drinks of alcohol   Drug use: No   Sexual activity: Yes    Birth control/protection: Surgical    Comment: supracervical hyst  Other Topics Concern   Not on file  Social History Narrative   Not on file   Social Determinants of Health   Financial Resource Strain: Low Risk  (04/04/2021)   Overall Financial Resource Strain (CARDIA)    Difficulty of Paying Living Expenses: Not very hard  Food Insecurity: No Food Insecurity (04/04/2021)   Hunger Vital Sign    Worried About Running Out of Food in the Last Year: Never true    Ran Out of Food in the Last Year: Never true  Transportation Needs: No Transportation Needs (04/04/2021)   PRAPARE - Hydrologist (Medical): No    Lack of Transportation (Non-Medical): No  Physical Activity: Sufficiently Active (04/04/2021)   Exercise Vital Sign    Days of Exercise per Week: 3 days    Minutes of Exercise per Session: 60 min  Stress: No Stress Concern Present (04/04/2021)   [place]    Feeling of Stress : Not at all  Social Connections: Unknown (04/04/2021)   Social Connection and Isolation Panel [NHANES]    Frequency of Communication  with Friends and Family: More than three times a week    Frequency of Social Gatherings with Friends and Family: Twice a week    Attends Religious Services: More than 4 times per year    Active Member of [employer] or Organizations: Yes    Attends Archivist Meetings: More than 4 times per year    Marital Status: Patient refused  Intimate Partner Violence: Not At Risk (04/04/2021)   Humiliation, Afraid, Rape, and Kick questionnaire    Fear of Current or Ex-Partner: No    Emotionally Abused: No    Physically Abused: No    Sexually Abused: No    Barbarann Ehlers RN was present throughout the entirety of the encounter.  Vitals:   12/13/21 1024  BP: 118/74  Pulse: 74  SpO2: 98%  Weight: 298 lb (135.2 kg)  Height: 5' 7.5" (1.715 m)     Wt Readings from Last 3 Encounters:  12/13/21 298 lb (135.2 kg)  12/03/21 280 lb (127 kg)  07/08/21 289 lb 6.4 oz (131.3 kg)     PHYSICAL EXAM  Affect appropriate Healthy:  appears stated age 89: normal Neck supple with no adenopathy JVP normal no bruits no thyromegaly Lungs clear with no wheezing and good diaphragmatic motion Heart:  S1/S2 no murmur, no rub, gallop or click PMI normal Abdomen: benighn, BS positve, no tenderness, no AAA no bruit.  No HSM or HJR Distal pulses intact with no bruits No edema Neuro non-focal Great toes ingrown nail no active edema or erythema No muscular weakness  ECG:  SR rate 65 poor R wave progression   Labs: Lab Results  Component Value Date/Time   K 3.2 (L) 08/05/2017 06:00 AM   BUN 8 08/05/2017 06:00 AM   CREATININE 0.97 08/05/2017 06:00 AM   ALT 23 06/11/2021 09:35 AM   HGB 10.6 (L) 08/05/2017 06:00 AM     Lipids: Lab Results  Component Value Date/Time   LDLCALC 105 (H) 08/04/2012 11:47 AM   LDLDIRECT 125.9 11/01/2012 10:59 AM   CHOL 202 (H) 11/01/2012 10:59 AM   TRIG 165.0 (H) 11/01/2012 10:59 AM   HDL 47.00 11/01/2012 10:59 AM       ASSESSMENT AND PLAN:  1.  Coronary  artery disease: History of one-vessel CABG in 2012.  Nuclear stress test was normal in November 2018.  Symptomatically stable.  Continue aspirin, atorvastatin, and Lopressor. Note initial pathology was coronary artery dissection not plaque    2.  Hypertension: Well controlled.  Continue current medications and low sodium Dash type diet.     3.  Hyperlipidemia: I will obtain a copy of lipids from PCP.  Continue atorvastatin.  4. Paronychia:  Keflex needs to be filled does not look bad should see podiatry and have nail root ablated with alcohol so does not return      Disposition: Follow up 1 year   Jenkins Rouge MD [facility]

## 2021-12-13 NOTE — Patient Instructions (Signed)
Medication Instructions:  Your physician recommends that you continue on your current medications as directed. Please refer to the Current Medication list given to you today.  *If you need a refill on your cardiac medications before your next appointment, please call your pharmacy*   Lab Work: NONE   If you have labs (blood work) drawn today and your tests are completely normal, you will receive your results only by: [place] (if you have MyChart) OR A paper copy in the mail If you have any lab test that is abnormal or we need to change your treatment, we will call you to review the results.   Testing/Procedures: NONE    Follow-Up: At [facility], you and your health needs are our priority.  As part of our continuing mission to provide you with exceptional heart care, we have created designated Provider Care Teams.  These Care Teams include your primary Cardiologist (physician) and Advanced Practice Providers (APPs -  Physician Assistants and Nurse Practitioners) who all work together to provide you with the care you need, when you need it.  We recommend signing up for the patient portal called "MyChart".  Sign up information is provided on this After Visit Summary.  MyChart is used to connect with patients for Virtual Visits (Telemedicine).  Patients are able to view lab/test results, encounter notes, upcoming appointments, etc.  Non-urgent messages can be sent to your provider as well.   To learn more about what you can do with MyChart, go to [email or web address].    Your next appointment:   1 year(s)  The format for your next appointment:   In Person  Provider:   Jenkins Rouge, MD    Other Instructions Thank you for choosing [facility] [place]!    Important Information About Sugar

## 2022-01-07 ENCOUNTER — Other Ambulatory Visit (HOSPITAL_COMMUNITY): Payer: Self-pay | Admitting: Internal Medicine

## 2022-01-07 DIAGNOSIS — Z1231 Encounter for screening mammogram for malignant neoplasm of breast: Secondary | ICD-10-CM

## 2022-01-09 ENCOUNTER — Ambulatory Visit (INDEPENDENT_AMBULATORY_CARE_PROVIDER_SITE_OTHER): Payer: BC Managed Care – PPO | Admitting: Podiatry

## 2022-01-09 DIAGNOSIS — Z79899 Other long term (current) drug therapy: Secondary | ICD-10-CM

## 2022-01-09 DIAGNOSIS — B351 Tinea unguium: Secondary | ICD-10-CM | POA: Diagnosis not present

## 2022-01-09 NOTE — Progress Notes (Signed)
Subjective:  Patient ID: Ashlee Stein, female    DOB: 01/20/1966,  MRN: [id number]  Chief Complaint  Patient presents with   Nail Problem    56 y.o. female presents with the above complaint.  Patient presents for follow-up of bilateral hallux onychomycosis.  Patient states the medication has helped but she still has about 55 days of medication left.  She states she just got a refill.  She wanted to also get her liver function test..   Review of Systems: Negative except as noted in the HPI. Denies N/V/F/Ch.  Past Medical History:  Diagnosis Date   Angina    Asthma    "as a child"   Coronary artery dissection    NSTEMI 8/12: cath with mid to dist LAD spont. dissection, unable to do PCI and referred for CABG:  SVG to LAD (Dr. PVT);  Carotid U/S 6/14:  Normal carotid arteries; FMD could not be ruled out   Coronary artery dissection 10/24/2010   Dyslipidemia    GERD (gastroesophageal reflux disease)    Heart disease    Hypertension    Iron deficiency anemia    Obesity    Papanicolaou smear of cervix with positive high risk human papilloma virus (HPV) test 04/11/2020   Pap negative but +HRHPV other, was negative for 16/18, repeat pap in 1 year per [facility] gudielines , 5 year CIN3+ risk is 2.25%    Current Outpatient Medications:    amLODipine (NORVASC) 10 MG tablet, Take 1 tablet (10 mg total) by mouth daily., Disp: 90 tablet, Rfl: 3   aspirin EC 81 MG tablet, Take 1 tablet (81 mg total) by mouth daily., Disp: , Rfl:    atorvastatin (LIPITOR) 20 MG tablet, TAKE 1 TABLET DAILY, Disp: 90 tablet, Rfl: 3   latanoprost (XALATAN) 0.005 % ophthalmic solution, Place 1 drop into both eyes at bedtime., Disp: , Rfl: 3   lisinopril-hydrochlorothiazide (PRINZIDE,ZESTORETIC) 20-12.5 MG tablet, Take 1 tablet by mouth 2 (two) times daily. , Disp: , Rfl: 3   metoprolol tartrate (LOPRESSOR) 25 MG tablet, TAKE 1 & 1/2 TABLETS BY MOUTH TWICE A DAY (Patient taking differently: Take 37.5 mg by mouth 2 (two)  times daily.), Disp: 270 tablet, Rfl: 3   Multiple Vitamin (MULITIVITAMIN WITH MINERALS) TABS, Take 1 tablet by mouth daily.  (Patient not taking: Reported on 04/04/2021), Disp: , Rfl:    mupirocin ointment (BACTROBAN) 2 %, Apply 1 Application topically 2 (two) times daily., Disp: 22 g, Rfl: 0   omeprazole (PRILOSEC) 40 MG capsule, Take 40 mg by mouth daily as needed (reflux).  (Patient not taking: Reported on 04/04/2021), Disp: , Rfl: 3   polyethylene glycol powder (MIRALAX) powder, Take 17 g by mouth daily. To prevent constipation (Patient not taking: Reported on 04/04/2021), Disp: 255 g, Rfl: prn   terbinafine (LAMISIL) 250 MG tablet, Take 1 tablet (250 mg total) by mouth daily., Disp: 90 tablet, Rfl: 0  Social History   Tobacco Use  Smoking Status Never  Smokeless Tobacco Never    Allergies  Allergen Reactions   Morphine And Related Rash   Penicillins Rash    Has patient had a PCN reaction causing immediate rash, facial/tongue/throat swelling, SOB or lightheadedness with hypotension: yes Has patient had a PCN reaction causing severe rash involving mucus membranes or skin necrosis: no Has patient had a PCN reaction that required hospitalization: no Has patient had a PCN reaction occurring within the last 10 years: no If all of the above answers are "NO", then  may proceed with Cephalosporin use.    Objective:  There were no vitals filed for this visit. There is no height or weight on file to calculate BMI. Constitutional Well developed. Well nourished.  Vascular Dorsalis pedis pulses palpable bilaterally. Posterior tibial pulses palpable bilaterally. Capillary refill normal to all digits.  No cyanosis or clubbing noted. Pedal hair growth normal.  Neurologic Normal speech. Oriented to person, place, and time. Epicritic sensation to light touch grossly present bilaterally.  Dermatologic Nails thickened elongated dystrophic mycotic discolored bilateral hallux.  Mild pain on  palpation Skin within normal limits  Orthopedic: Normal joint ROM without pain or crepitus bilaterally. No visible deformities. No bony tenderness.   Radiographs: None Assessment:   1. Long-term use of high-risk medication    Plan:  Patient was evaluated and treated and all questions answered.  Bilateral hallux onychomycosis/nail fungus~second round -Educated the patient on the etiology of onychomycosis and various treatment options associated with improving the fungal load.  I explained to the patient that there is 3 treatment options available to treat the onychomycosis including topical, p.o., laser treatment.  Patient elected to undergo p.o. options with Lamisil/terbinafine therapy.  In order for me to start the medication therapy, I explained to the patient the importance of evaluating the liver and obtaining the liver function test.  Once the liver function test comes back normal I will start him on 43-monthcourse of Lamisil therapy.  Patient understood all risk and would like to proceed with Lamisil therapy.  I have asked the patient to immediately stop the Lamisil therapy if she has any reactions to it and call the office or go to the emergency room right away.  Patient states understanding  -I will order another liver function test just to evaluate however we will not be sending in another refill  No follow-ups on file.

## 2022-01-10 LAB — HEPATIC FUNCTION PANEL
ALT: 24 IU/L (ref 0–32)
AST: 20 IU/L (ref 0–40)
Albumin: 4.3 g/dL (ref 3.8–4.9)
Alkaline Phosphatase: 76 IU/L (ref 44–121)
Bilirubin Total: 0.2 mg/dL (ref 0.0–1.2)
Bilirubin, Direct: <0.1 mg/dL (ref 0.00–0.40)
Total Protein: 6.7 g/dL (ref 6.0–8.5)

## 2022-01-15 ENCOUNTER — Ambulatory Visit (HOSPITAL_COMMUNITY): Payer: BC Managed Care – PPO

## 2022-01-15 IMAGING — MG DIGITAL SCREENING BILAT W/ TOMO W/ CAD
6 of 9 series · 6 of 25 positions shown · non-contrast
Comparison: Previous exam(s).

CLINICAL DATA: Screening.

EXAM:
DIGITAL SCREENING BILATERAL MAMMOGRAM WITH TOMO AND CAD

[R MLO]
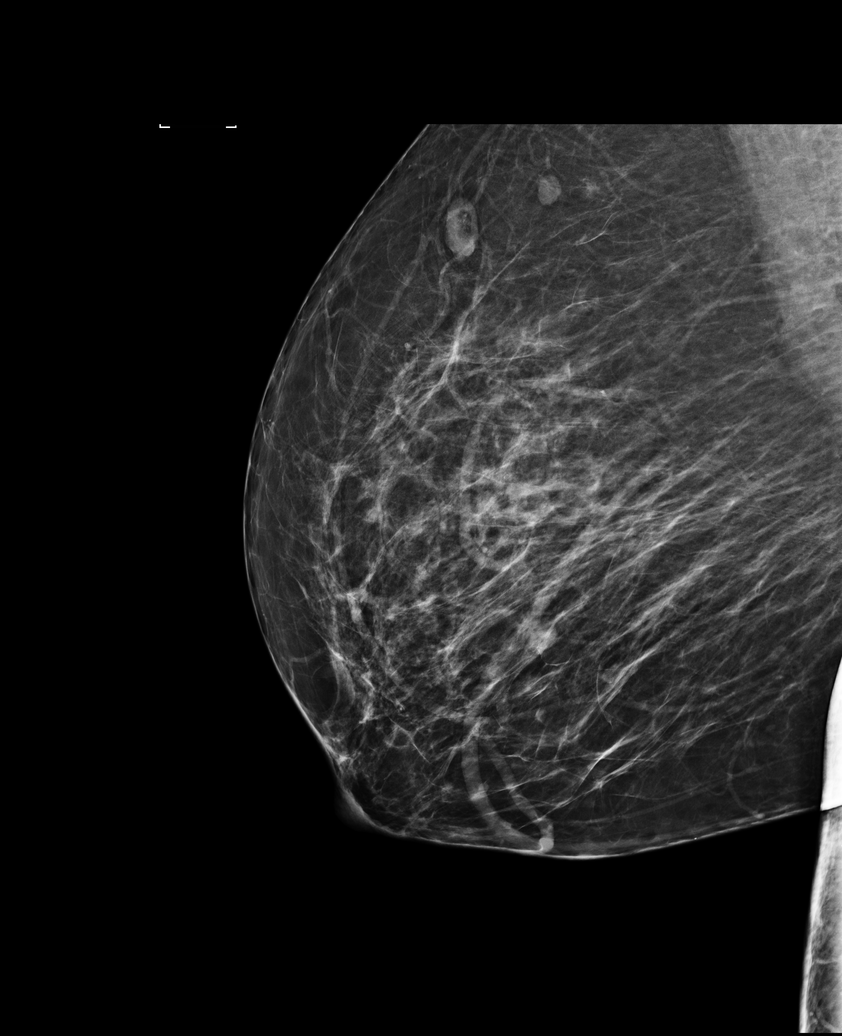

[L CC synth-2D]
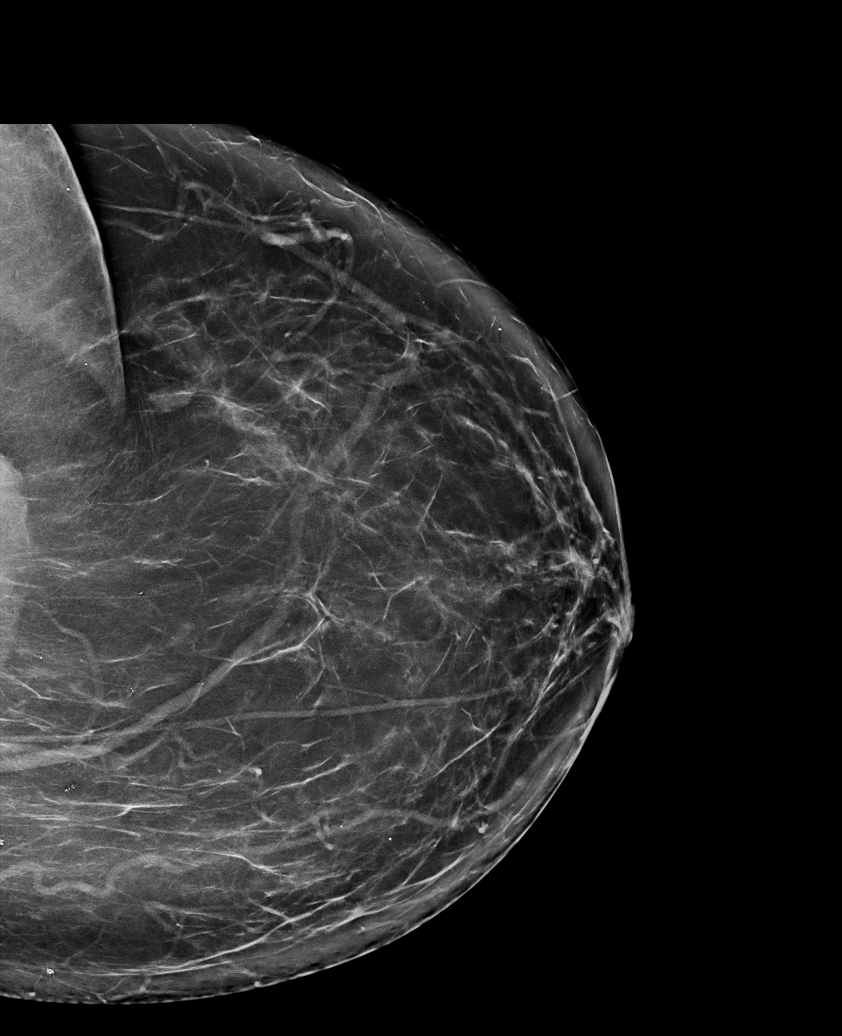

[L MLO synth-2D]
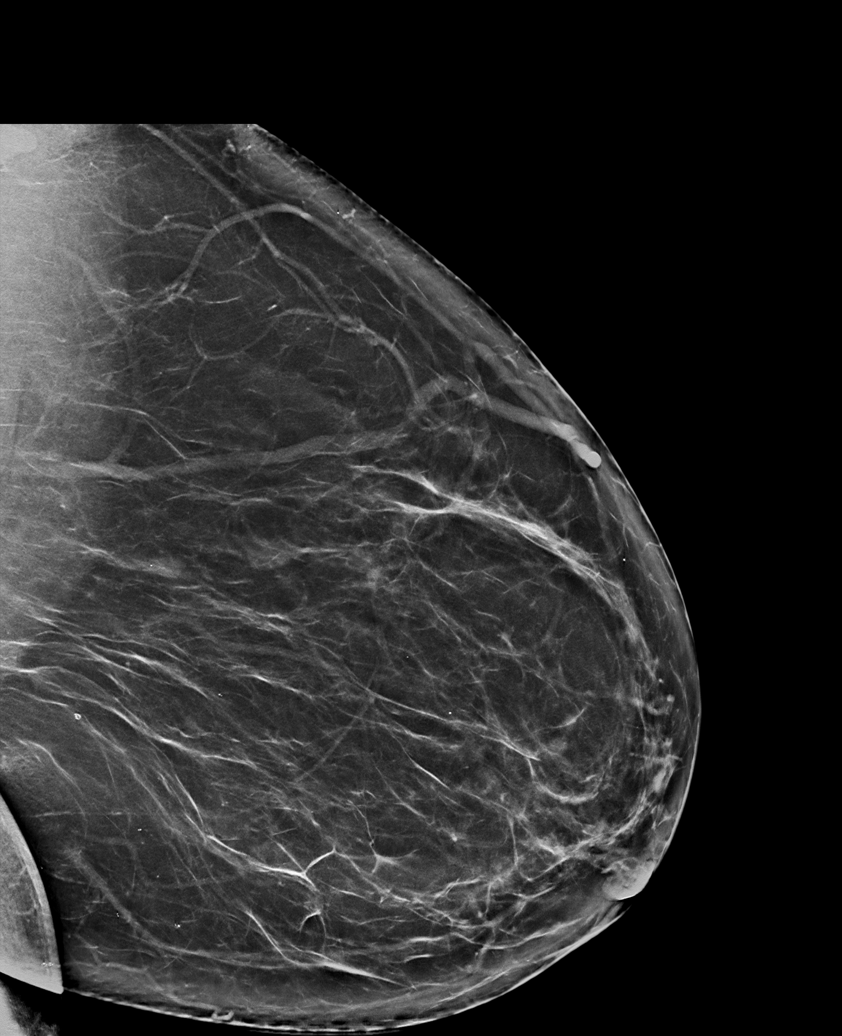

[R CC synth-2D]
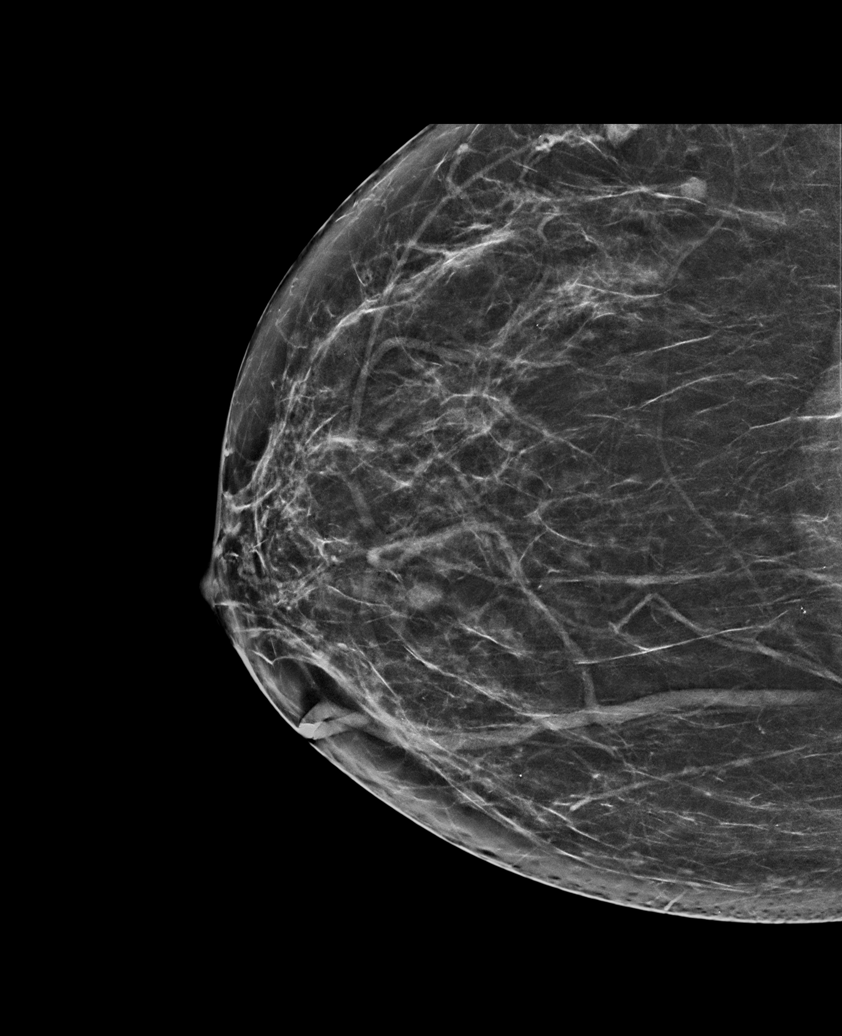

[R MLO synth-2D]
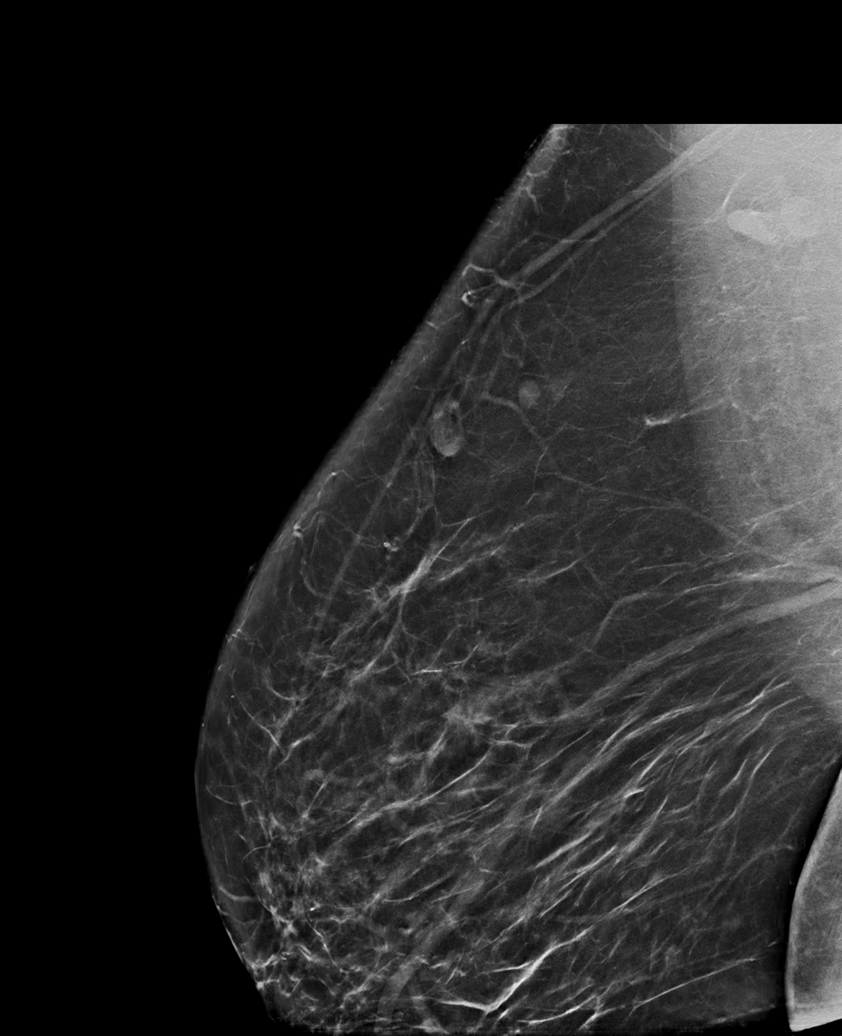

[R MLO tomo · tomo slice 47/94.0]
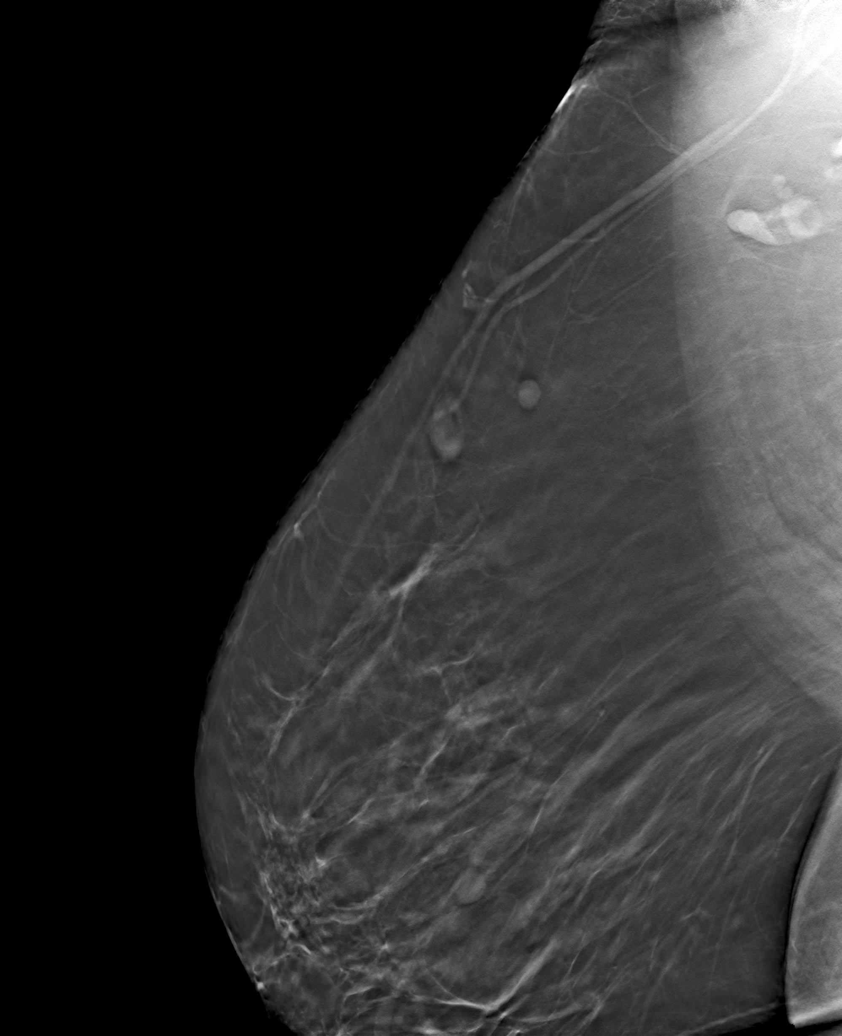

[6 of 25 positions shown; findings below may reference images not displayed]

ACR Breast Density Category b: There are scattered areas of
fibroglandular density.
FINDINGS: There are no findings suspicious for malignancy. Images were
processed with CAD.
IMPRESSION: No mammographic evidence of malignancy. A result letter of this
screening mammogram will be mailed directly to the patient.

RECOMMENDATION:
Screening mammogram in one year. (Code:[id number])

BI-RADS CATEGORY  1: Negative.

## 2022-02-10 ENCOUNTER — Ambulatory Visit (HOSPITAL_COMMUNITY)
Admission: RE | Admit: 2022-02-10 | Discharge: 2022-02-10 | Disposition: A | Discharge status: Home / Self Care | Payer: BC Managed Care – PPO | Source: Ambulatory Visit | Attending: Internal Medicine | Admitting: Internal Medicine

## 2022-02-10 ENCOUNTER — Ambulatory Visit (HOSPITAL_COMMUNITY): Payer: BC Managed Care – PPO

## 2022-02-10 DIAGNOSIS — Z1231 Encounter for screening mammogram for malignant neoplasm of breast: Secondary | ICD-10-CM | POA: Diagnosis present

## 2022-04-11 ENCOUNTER — Ambulatory Visit: Payer: BC Managed Care – PPO | Admitting: Podiatry

## 2022-04-21 ENCOUNTER — Other Ambulatory Visit: Payer: Self-pay | Admitting: Podiatry

## 2022-12-08 ENCOUNTER — Other Ambulatory Visit (HOSPITAL_COMMUNITY): Payer: Self-pay | Admitting: Internal Medicine

## 2022-12-08 DIAGNOSIS — Z1231 Encounter for screening mammogram for malignant neoplasm of breast: Secondary | ICD-10-CM

## 2023-01-28 ENCOUNTER — Encounter: Payer: Self-pay | Admitting: Nurse Practitioner

## 2023-01-28 ENCOUNTER — Ambulatory Visit: Payer: BC Managed Care – PPO | Attending: Nurse Practitioner | Admitting: Nurse Practitioner

## 2023-01-28 VITALS — BP 106/64 | HR 80 | Ht 67.0 in | Wt 290.0 lb

## 2023-01-28 DIAGNOSIS — E785 Hyperlipidemia, unspecified: Secondary | ICD-10-CM | POA: Diagnosis not present

## 2023-01-28 DIAGNOSIS — I10 Essential (primary) hypertension: Secondary | ICD-10-CM

## 2023-01-28 DIAGNOSIS — I2542 Coronary artery dissection: Secondary | ICD-10-CM | POA: Diagnosis not present

## 2023-01-28 DIAGNOSIS — I25119 Atherosclerotic heart disease of native coronary artery with unspecified angina pectoris: Secondary | ICD-10-CM | POA: Diagnosis not present

## 2023-01-28 DIAGNOSIS — R0789 Other chest pain: Secondary | ICD-10-CM | POA: Diagnosis not present

## 2023-01-28 MED ORDER — NITROGLYCERIN 0.4 MG SL SUBL: 0.4000 mg | SUBLINGUAL_TABLET | SUBLINGUAL | 1 refills | Status: AC | PRN

## 2023-01-28 NOTE — Progress Notes (Signed)
Cardiology Office Note:  .   Date:  01/28/2023 ID:  Nemesis Kovalsky, DOB 03/17/1965, MRN [id number] PCP: Benetta Spar, MD  [facility] Providers Cardiologist:  Charlton Haws, MD    History of Present Illness: .   Ashlee Stein is a 57 y.o. female with a PMH of CAD, s/p one-vessel CABG (SVG-LAD) in August 2012, hypertension, hyperlipidemia, morbid obesity, leg edema, paronychia, who presents today for scheduled follow-up.  Last seen by Dr. Eden Emms on December 13, 2021.  She was overall doing well at that time.  Today she presents for follow-up.  She states she has had 2 recent episodes of chest pain within the past month.  Says she describes chest pain as a chest tightness/cramping sensation states first time she thought she pulled a muscle and episode lasted a couple hours, then subsided.  She says a week later she had another episode and denies any triggers, took aspirin and pain subsided in 10 to 15 minutes. Denies any shortness of breath, palpitations, syncope, presyncope, dizziness, orthopnea, PND, swelling or significant weight changes, acute bleeding, or claudication.   ROS: Negative. See HPI.   Studies Reviewed: Marland Kitchen    EKG: EKG Interpretation Date/Time:  Wednesday January 28 2023 13:25:40 EST Ventricular Rate:  74 PR Interval:  138 QRS Duration:  146 QT Interval:  444 QTC Calculation: 492 R Axis:   -48  Text Interpretation: Normal sinus rhythm Right bundle branch block Left anterior fascicular block Bifascicular block Minimal voltage criteria for LVH, may be normal variant ( R in aVL ) When compared with ECG of 30-Jul-2017 10:19, (RBBB and left anterior fascicular block) is now Present Minimal criteria for Anterior infarct are no longer Present Confirmed by Sharlene Dory [phone]) on 01/28/2023 1:30:48 PM   Lexiscan 12/2016: Blood pressure demonstrated a normal response to exercise. There was no ST segment deviation noted during stress. The study is normal. This is a  low risk study. Nuclear stress EF: 69%.     Physical Exam:   VS:  BP 106/64   Pulse 80   Ht 5\' 7"  (1.702 m)   Wt 290 lb (131.5 kg)   LMP 07/23/2017 (Approximate) Comment: SCH  SpO2 96%   BMI 45.42 kg/m    Wt Readings from Last 3 Encounters:  01/28/23 290 lb (131.5 kg)  12/13/21 298 lb (135.2 kg)  12/03/21 280 lb (127 kg)    GEN: Morbidly obese, 57 y.o. female in no acute distress NECK: No JVD; No carotid bruits CARDIAC: S1/S2, RRR, no murmurs, rubs, gallops RESPIRATORY:  Clear to auscultation without rales, wheezing or rhonchi  ABDOMEN: Soft, non-tender, non-distended EXTREMITIES:  No edema; No deformity   ASSESSMENT AND PLAN: .   CAD, s/p CABG x 1, chest tightness 2 episodes of recent chest pain within the past month.  Etiology uncertain.  ETT in 2018 was normal.  Discussed ischemic evaluation including NST versus ETT, however patient declines NST and is requesting new ETT.  Discussed risk versus benefits of ETT and she verbalizes understanding is agreeable to proceed.  Will arrange ETT and will also arrange echocardiogram.  Will send in Rx for nitroglycerin as needed.  Discussed about medication.  She verbalized understanding.  Continue aspirin, atorvastatin, Lopressor, lisinopril-hydrochlorothiazide. Heart healthy diet and regular cardiovascular exercise encouraged. Care and ED precautions discussed.    Informed Consent   Shared Decision Making/Informed Consent The risks [chest pain, shortness of breath, cardiac arrhythmias, dizziness, blood pressure fluctuations, myocardial infarction, stroke/transient ischemic attack, and life-threatening complications (estimated  to be 1 in 10,000)], benefits (risk stratification, diagnosing coronary artery disease, treatment guidance) and alternatives of an exercise tolerance test were discussed in detail with Ms. Manson Passey and she agrees to proceed.     2. HTN Blood pressure stable. Discussed to monitor BP at home at least 2 hours after  medications and sitting for 5-10 minutes.  No medication changes at this time. Heart healthy diet and regular cardiovascular exercise encouraged.   3. HLD No recent lipid panel on file.  Will request labs from PCPs office.  No medication changes at this time. Heart healthy diet and regular cardiovascular exercise encouraged.   4. Morbid obesity Weight loss via diet and exercise encouraged. Discussed the impact being overweight would have on cardiovascular risk.   Dispo: Follow-up in 8 weeks with me/APP or sooner if anything changes.  Signed, Sharlene Dory, NP

## 2023-01-28 NOTE — Patient Instructions (Addendum)
Medication Instructions:  Your physician has recommended you make the following change in your medication:  The proper use and anticipated side effects of nitroglycerine has been carefully explained.  If a single episode of chest pain is not relieved by one tablet, the patient will try another within 5 minutes; and if this doesn't relieve the pain, the patient is instructed to call 911 for transportation to an emergency department.  Labwork: None   Testing/Procedures: Your physician has requested that you have an echocardiogram. Echocardiography is a painless test that uses sound waves to create images of your heart. It provides your doctor with information about the size and shape of your heart and how well your heart's chambers and valves are working. This procedure takes approximately one hour. There are no restrictions for this procedure. Please do NOT wear cologne, perfume, aftershave, or lotions (deodorant is allowed). Please arrive 15 minutes prior to your appointment time.  Please note: We ask at that you not bring children with you during ultrasound (echo/ vascular) testing. Due to room size and safety concerns, children are not allowed in the ultrasound rooms during exams. Our front office staff cannot provide observation of children in our lobby area while testing is being conducted. An adult accompanying a patient to their appointment will only be allowed in the ultrasound room at the discretion of the ultrasound technician under special circumstances. We apologize for any inconvenience. Your physician discussed the importance of regular exercise and recommended that you start or continue a regular exercise program for good health.  Follow-Up: Your physician recommends that you schedule a follow-up appointment in: 8 weeks peck   Any Other Special Instructions Will Be Listed Below (If Applicable).  If you need a refill on your cardiac medications before your next appointment, please  call your pharmacy.

## 2023-02-05 ENCOUNTER — Ambulatory Visit (HOSPITAL_COMMUNITY)
Admission: RE | Admit: 2023-02-05 | Discharge: 2023-02-05 | Disposition: A | Discharge status: Home / Self Care | Payer: BC Managed Care – PPO | Source: Ambulatory Visit | Attending: Internal Medicine | Admitting: Internal Medicine

## 2023-02-05 ENCOUNTER — Encounter (HOSPITAL_COMMUNITY): Payer: Self-pay

## 2023-02-05 DIAGNOSIS — Z1231 Encounter for screening mammogram for malignant neoplasm of breast: Secondary | ICD-10-CM | POA: Diagnosis present

## 2023-02-09 ENCOUNTER — Ambulatory Visit (HOSPITAL_COMMUNITY)
Admission: RE | Admit: 2023-02-09 | Discharge: 2023-02-09 | Disposition: A | Discharge status: Home / Self Care | Payer: BC Managed Care – PPO | Source: Ambulatory Visit | Attending: Nurse Practitioner | Admitting: Nurse Practitioner

## 2023-02-09 ENCOUNTER — Ambulatory Visit (HOSPITAL_BASED_OUTPATIENT_CLINIC_OR_DEPARTMENT_OTHER)
Admission: RE | Admit: 2023-02-09 | Discharge: 2023-02-09 | Disposition: A | Discharge status: Home / Self Care | Payer: BC Managed Care – PPO | Source: Ambulatory Visit | Attending: Internal Medicine | Admitting: Internal Medicine

## 2023-02-09 DIAGNOSIS — R0789 Other chest pain: Secondary | ICD-10-CM | POA: Diagnosis present

## 2023-02-09 DIAGNOSIS — I25119 Atherosclerotic heart disease of native coronary artery with unspecified angina pectoris: Secondary | ICD-10-CM | POA: Diagnosis present

## 2023-02-09 LAB — EXERCISE TOLERANCE TEST
Angina Index: 0
Duke Treadmill Score: 5
Estimated workload: 7
Exercise duration (min): 4 min
Exercise duration (sec): 39 s
MPHR: 163 {beats}/min
Peak HR: 136 {beats}/min
Percent HR: 83 %
RPE: 13
Rest HR: 68 {beats}/min
ST Depression (mm): 0 mm

## 2023-02-09 LAB — ECHOCARDIOGRAM COMPLETE
AR max vel: 2.44 cm2
AV Area VTI: 2.42 cm2
AV Area mean vel: 2.38 cm2
AV Mean grad: 3 mm[Hg]
AV Peak grad: 6.7 mm[Hg]
Ao pk vel: 1.29 m/s
Area-P 1/2: 2.39 cm2
S' Lateral: 2.7 cm

## 2023-02-09 NOTE — Progress Notes (Signed)
*  PRELIMINARY RESULTS* Echocardiogram 2D Echocardiogram has been performed.  Ashlee Stein 02/09/2023, 9:10 AM

## 2023-02-27 ENCOUNTER — Encounter: Payer: Self-pay | Admitting: Adult Health

## 2023-02-27 ENCOUNTER — Other Ambulatory Visit (HOSPITAL_COMMUNITY)
Admission: RE | Admit: 2023-02-27 | Discharge: 2023-02-27 | Disposition: A | Discharge status: Home / Self Care | Payer: BC Managed Care – PPO | Source: Ambulatory Visit | Attending: Adult Health | Admitting: Adult Health

## 2023-02-27 ENCOUNTER — Ambulatory Visit: Payer: BC Managed Care – PPO | Admitting: Adult Health

## 2023-02-27 VITALS — BP 107/69 | HR 63 | Ht 67.0 in | Wt 296.6 lb

## 2023-02-27 DIAGNOSIS — L80 Vitiligo: Secondary | ICD-10-CM | POA: Diagnosis not present

## 2023-02-27 DIAGNOSIS — Z90711 Acquired absence of uterus with remaining cervical stump: Secondary | ICD-10-CM

## 2023-02-27 DIAGNOSIS — Z1211 Encounter for screening for malignant neoplasm of colon: Secondary | ICD-10-CM

## 2023-02-27 DIAGNOSIS — K429 Umbilical hernia without obstruction or gangrene: Secondary | ICD-10-CM

## 2023-02-27 DIAGNOSIS — Z01419 Encounter for gynecological examination (general) (routine) without abnormal findings: Secondary | ICD-10-CM | POA: Diagnosis present

## 2023-02-27 DIAGNOSIS — N3946 Mixed incontinence: Secondary | ICD-10-CM

## 2023-02-27 DIAGNOSIS — Z1331 Encounter for screening for depression: Secondary | ICD-10-CM | POA: Diagnosis not present

## 2023-02-27 LAB — HEMOCCULT GUIAC POC 1CARD (OFFICE): Fecal Occult Blood, POC: NEGATIVE

## 2023-02-27 NOTE — Progress Notes (Signed)
 Patient ID: Ashlee Stein, female   DOB: [birth year], 58 y.o.   MRN: [id number] History of Present Illness: Ashlee Stein is a 58 year old black female,separated, sp [facility] in for a well woman gyn exam and she wants a pap smear. She is having some urinary leakage  with coughing and if has to go, but not a lot.  PCP is Dr Carlette   Current Medications, Allergies, Past Medical History, Past Surgical History, Family History and Social History were reviewed in [employer] electronic medical record.     Review of Systems: Patient denies any headaches, hearing loss, fatigue, blurred vision, shortness of breath, chest pain, abdominal pain, problems with bowel movements, or intercourse. No joint pain or mood swings.  See HPI for positives.    Physical Exam:BP 107/69 (BP Location: Left Arm, Patient Position: Sitting, Cuff Size: Large)   Pulse 63   Ht 5' 7 (1.702 m)   Wt 296 lb 9.6 oz (134.5 kg)   LMP 07/23/2017 (Approximate) Comment: SCH  BMI 46.45 kg/m   General:  Well developed, well nourished, no acute distress Skin:  Warm and dry, has vitiligo over hands Neck:  Midline trachea, normal thyroid, good ROM, no lymphadenopathy Lungs; Clear to auscultation bilaterally Breast:  No dominant palpable mass, retraction, or nipple discharge Cardiovascular: Regular rate and rhythm Abdomen:  Soft, non tender, no hepatosplenomegaly,has small umbilical hernia Pelvic:  External genitalia is normal in appearance, +vitiligo.  The vagina is pale. Urethra has no lesions or masses. The cervix is smooth, pap with HR HPV genotyping performed.  Uterus is felt to be normal size, shape, and contour.  No adnexal masses or tenderness noted.Bladder is non tender, no masses felt. Rectal: Good sphincter tone, no polyps, or hemorrhoids felt.  Hemoccult negative. Extremities/musculoskeletal:  No swelling or varicosities noted, no clubbing or cyanosis Psych:  No mood changes, alert and cooperative,seems happy AA is 0 Fall risk is  low    02/27/2023    9:15 AM 04/04/2021    8:51 AM 04/03/2020    4:07 PM  Depression screen PHQ 2/9  Decreased Interest 0 0 0  Down, Depressed, Hopeless 0 0 0  PHQ - 2 Score 0 0 0  Altered sleeping 0 0 0  Tired, decreased energy 1 0 0  Change in appetite 0 0 0  Feeling bad or failure about yourself  0 0 0  Trouble concentrating 0 0 0  Moving slowly or fidgety/restless 0 0 0  Suicidal thoughts 0 0 0  PHQ-9 Score 1 0 0       02/27/2023    9:15 AM 04/04/2021    8:51 AM 04/03/2020    4:11 PM  GAD 7 : Generalized Anxiety Score  Nervous, Anxious, on Edge 0 0 0  Control/stop worrying 0 0 0  Worry too much - different things 0 0 0  Trouble relaxing 0 0 0  Restless 0 0 0  Easily annoyed or irritable 0 0 0  Afraid - awful might happen 0 0 0  Total GAD 7 Score 0 0 0      Upstream - 02/27/23 0908       Pregnancy Intention Screening   Does the patient want to become pregnant in the next year? N/A    Does the patient's partner want to become pregnant in the next year? N/A    Would the patient like to discuss contraceptive options today? N/A      Contraception Wrap Up   Current Method Female Sterilization  [facility]   End Method Female Sterilization   [facility]   Contraception Counseling Provided No             Examination chaperoned by Clarita Salt LPN  Impression and plan: 1. Encounter for gynecological examination with Papanicolaou smear of cervix (Primary) Pap sent Pap in 3-5 years if normal Physical in 1 year Labs with PCP Mammogram was negative 02/05/23 Colonoscopy per GI  - Cytology - PAP( [place])  2. Encounter for screening fecal occult blood testing Hemoccult negative  - POCT occult blood stool  3. S/P abdominal supracervical subtotal hysterectomy  4. Vitiligo  5. Umbilical hernia without obstruction and without gangrene  6. Mixed stress and urge urinary incontinence Try doing kegel exercises Review handout Try to lose about 30 lbs

## 2023-03-04 LAB — CYTOLOGY - PAP
Adequacy: ABSENT
Comment: NEGATIVE
Diagnosis: NEGATIVE
High risk HPV: NEGATIVE

## 2023-03-05 ENCOUNTER — Other Ambulatory Visit: Payer: Self-pay | Admitting: Adult Health

## 2023-03-05 MED ORDER — METRONIDAZOLE 500 MG PO TABS: 500.0000 mg | ORAL_TABLET | Freq: Two times a day (BID) | ORAL | 0 refills | Status: DC

## 2023-03-10 ENCOUNTER — Ambulatory Visit: Payer: BC Managed Care – PPO | Admitting: Cardiovascular Disease

## 2023-03-12 ENCOUNTER — Ambulatory Visit: Payer: BC Managed Care – PPO | Admitting: Adult Health

## 2023-03-26 ENCOUNTER — Encounter: Payer: Self-pay | Admitting: Nurse Practitioner

## 2023-03-26 ENCOUNTER — Ambulatory Visit: Payer: BC Managed Care – PPO | Attending: Nurse Practitioner | Admitting: Nurse Practitioner

## 2023-03-26 VITALS — BP 108/68 | HR 78 | Ht 67.0 in | Wt 292.0 lb

## 2023-03-26 DIAGNOSIS — I10 Essential (primary) hypertension: Secondary | ICD-10-CM

## 2023-03-26 DIAGNOSIS — I2510 Atherosclerotic heart disease of native coronary artery without angina pectoris: Secondary | ICD-10-CM

## 2023-03-26 DIAGNOSIS — E785 Hyperlipidemia, unspecified: Secondary | ICD-10-CM | POA: Diagnosis not present

## 2023-03-26 NOTE — Progress Notes (Signed)
Cardiology Office Note:  .   Date:  03/26/2023 ID:  Ashlee Stein, DOB Jun 24, 1965, MRN [id number] PCP: Ashlee Spar, MD  [facility] Providers Cardiologist:  Charlton Haws, MD    History of Present Illness: .   Ashlee Stein is a 58 y.o. female with a PMH of CAD, s/p one-vessel CABG (SVG-LAD) in August 2012, hypertension, hyperlipidemia, morbid obesity, leg edema, paronychia, who presents today for scheduled follow-up.  Last seen by Dr. Eden Emms on December 13, 2021.  She was overall doing well at that time.  01/28/2023 - Today she presents for follow-up.  She states she has had 2 recent episodes of chest pain within the past month.  Says she describes chest pain as a chest tightness/cramping sensation states first time she thought she pulled a muscle and episode lasted a couple hours, then subsided.  She says a week later she had another episode and denies any triggers, took aspirin and pain subsided in 10 to 15 minutes. Denies any shortness of breath, palpitations, syncope, presyncope, dizziness, orthopnea, PND, swelling or significant weight changes, acute bleeding, or claudication.  03/26/2023 - Presents for follow-up. Says she could not complete stress test d/t becoming hot during test. See full report below. Denies any chest pain, shortness of breath, palpitations, syncope, presyncope, dizziness, orthopnea, PND, swelling or significant weight changes, acute bleeding, or claudication.  ROS: Negative. See HPI.   Studies Reviewed: Marland Kitchen    EKG: EKG is not ordered today.  ETT 01/2023:      No ST deviation was noted.   Patient just missed reaching heart rate target  (achieved 82% of THR). This lowers the sensitivity of exercise stress testing to detect ischemia   No evidence of ischemia for level of exertion achieved. If high suspicion of ischemic disease consider stress pharmacological  imaging either SPECT or PET.  Lexiscan 12/2016: Blood pressure demonstrated a normal response to  exercise. There was no ST segment deviation noted during stress. The study is normal. This is a low risk study. Nuclear stress EF: 69%.     Physical Exam:   VS:  BP 108/68   Pulse 78   Ht 5\' 7"  (1.702 m)   Wt 292 lb (132.5 kg)   LMP 07/23/2017 (Approximate) Comment: SCH  SpO2 98%   BMI 45.73 kg/m    Wt Readings from Last 3 Encounters:  03/26/23 292 lb (132.5 kg)  02/27/23 296 lb 9.6 oz (134.5 kg)  01/28/23 290 lb (131.5 kg)    GEN: Morbidly obese, 58 y.o. female in no acute distress NECK: No JVD; No carotid bruits CARDIAC: S1/S2, RRR, no murmurs, rubs, gallops RESPIRATORY:  Clear to auscultation without rales, wheezing or rhonchi  ABDOMEN: Soft, non-tender, non-distended EXTREMITIES:  No edema; No deformity   ASSESSMENT AND PLAN: .   CAD, s/p CABG x 1 Denies any recurrent CP. ETT in 2018 was normal.  Discussed results of previous test noted above. No indication for repeat ischemic evaluation at this time.  Continue aspirin, atorvastatin, Lopressor, lisinopril-hydrochlorothiazide, and NTG PRN. Heart healthy diet and regular cardiovascular exercise encouraged. Care and ED precautions discussed.   2. HTN Blood pressure stable. Discussed to monitor BP at home at least 2 hours after medications and sitting for 5-10 minutes.  No medication changes at this time. Heart healthy diet and regular cardiovascular exercise encouraged.   3. HLD No recent lipid panel on file.  Will request labs from PCP's office.  No medication changes at this time. Heart healthy diet  and regular cardiovascular exercise encouraged.   4. Morbid obesity Weight loss via diet and exercise encouraged. Discussed the impact being overweight would have on cardiovascular risk.  Discussed GLP-1 inhibitors with patient as she had questions regarding this.  Will see what lab work shows from PCPs office to determine next steps regarding possible medical therapy.   Dispo: Follow-up in 6 months with Dr. Eden Emms or APP or  sooner if anything changes.   Signed, Sharlene Dory, NP

## 2023-03-26 NOTE — Patient Instructions (Addendum)

## 2023-04-28 ENCOUNTER — Encounter: Payer: Self-pay | Admitting: Nurse Practitioner

## 2023-04-28 ENCOUNTER — Telehealth: Payer: Self-pay | Admitting: Nurse Practitioner

## 2023-04-28 NOTE — Telephone Encounter (Signed)
 Patient would like to start on new medication for weight loss. She states this was previously discussed with Sharlene Dory, NP. Please advise.

## 2023-04-28 NOTE — Telephone Encounter (Signed)
 Unable to leave message MyChart message sent to patient.  Labs requested from PCP

## 2023-09-14 ENCOUNTER — Other Ambulatory Visit (HOSPITAL_COMMUNITY): Payer: Self-pay | Admitting: Internal Medicine

## 2023-09-14 DIAGNOSIS — Z1231 Encounter for screening mammogram for malignant neoplasm of breast: Secondary | ICD-10-CM

## 2023-09-17 ENCOUNTER — Encounter (HOSPITAL_COMMUNITY): Payer: Self-pay

## 2023-09-17 ENCOUNTER — Ambulatory Visit (HOSPITAL_COMMUNITY)
Admission: RE | Admit: 2023-09-17 | Discharge: 2023-09-17 | Disposition: A | Discharge status: Home / Self Care | Source: Ambulatory Visit | Attending: Internal Medicine | Admitting: Internal Medicine

## 2023-09-17 DIAGNOSIS — Z1231 Encounter for screening mammogram for malignant neoplasm of breast: Secondary | ICD-10-CM | POA: Insufficient documentation

## 2023-09-24 ENCOUNTER — Ambulatory Visit: Payer: BC Managed Care – PPO | Admitting: Nurse Practitioner

## 2023-11-13 NOTE — Progress Notes (Deleted)
 SUBJECTIVE: 58 y.o. previously followed by Dr Charls  She has a history of coronary artery disease and underwent 1-vessel CABG (SVG to LAD) in August 2012. This was for a complication of acute coronary dissection after attempted intervention  She also has a history of hypertension.   TTE 02/09/23 reviewed. EF 60-65% no significant valve dx ETT 02/09/23 normal although only reached 82% PMHR    Nuclear stress test was normal on 01/09/2017, EF 69%.  The patient denies any symptoms of chest pain, palpitations, shortness of breath, lightheadedness, dizziness, leg swelling, orthopnea, PND, and syncope.  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 Lives in [place]. year-old daughter (2020) who is completed her masters in Barista and is now working for the [employer].  She has a 7 year old son (2020) who is a Holiday representative in high school in [place] and taking advanced classes.  Review of Systems: As per subjective, otherwise negative.  Allergies  Allergen Reactions   Morphine  And Codeine Rash   Penicillins Rash    Has patient had a PCN reaction causing immediate rash, facial/tongue/throat swelling, SOB or lightheadedness with hypotension: yes Has patient had a PCN reaction causing severe rash involving mucus membranes or skin necrosis: no Has patient had a PCN reaction that required hospitalization: no Has patient had a PCN reaction occurring within the last 10 years: no If all of the above answers are NO, then may proceed with Cephalosporin use.     Current Outpatient Medications  Medication Sig Dispense Refill   amLODipine  (NORVASC ) 10 MG tablet Take 1 tablet (10 mg total) by mouth daily. 90 tablet 3   aspirin  EC 81 MG  tablet Take 1 tablet (81 mg total) by mouth daily.     atorvastatin  (LIPITOR) 20 MG tablet TAKE 1 TABLET DAILY 90 tablet 3   latanoprost (XALATAN) 0.005 % ophthalmic solution Place 1 drop into both eyes at bedtime.  3   lisinopril -hydrochlorothiazide  (PRINZIDE ,ZESTORETIC ) 20-12.5 MG tablet Take 1 tablet by mouth 2 (two) times daily.   3   metoprolol  tartrate (LOPRESSOR ) 25 MG tablet TAKE 1 & 1/2 TABLETS BY MOUTH TWICE A DAY (Patient taking differently: Take 37.5 mg by mouth 2 (two) times daily.) 270 tablet 3   metroNIDAZOLE  (FLAGYL ) 500 MG tablet Take 1 tablet (500 mg total) by mouth 2 (two) times daily. 14 tablet 0   Multiple Vitamin (MULITIVITAMIN WITH MINERALS) TABS Take 1 tablet by mouth daily.     nitroGLYCERIN  (NITROSTAT ) 0.4 MG SL tablet Place 1 tablet (0.4 mg total) under the tongue every 5 (five) minutes as needed. 25 tablet 1   omeprazole  (PRILOSEC) 40 MG capsule Take 40 mg by mouth daily as needed (reflux).  3   polyethylene glycol powder (MIRALAX ) powder Take 17 g by mouth daily. To prevent constipation 255 g prn   No current facility-administered medications for this visit.    Past Medical History:  Diagnosis Date   Angina    Asthma    as a child   Coronary artery dissection    NSTEMI 8/12: cath with mid to dist LAD spont. dissection, unable to do PCI and referred for CABG:  SVG to LAD (Dr. PVT);  Carotid U/S 6/14:  Normal carotid arteries; FMD could not be ruled out   Coronary artery dissection 10/24/2010   Dyslipidemia    GERD (gastroesophageal reflux disease)    Heart disease    Hypertension    Iron deficiency anemia    Obesity    Papanicolaou smear of cervix with positive high risk human papilloma virus (HPV) test 04/11/2020   Pap negative but +HRHPV other, was negative for 16/18, repeat pap in 1 year per [facility] gudielines , 5 year CIN3+ risk is 2.25%    Past Surgical History:  Procedure Laterality Date   BILATERAL SALPINGECTOMY Bilateral 08/04/2017   Procedure:  BILATERAL SALPINGECTOMY;  Surgeon: Edsel Norleen GAILS, MD;  Location: [facility];  Service: Gynecology;  Laterality: Bilateral;   CARDIAC SURGERY     open heart surgery   CERVICAL POLYPECTOMY  07/01/2011   Procedure: CERVICAL POLYPECTOMY;  Surgeon: Norleen GAILS Edsel, MD;  Location: [facility];  Service: Gynecology;  Laterality: N/A;  Endometrial Polypectomy   COLONOSCOPY N/A 02/23/2019   Procedure: COLONOSCOPY;  Surgeon: Harvey Margo CROME, MD;  Location: [facility];  Service: Endoscopy;  Laterality: N/A;  8:30   CORONARY ARTERY BYPASS GRAFT  10/24/2010   CABG X1:  coronary artery dissection -->CABG:  SVG to LAD (Dr. PVT)   DILATION AND CURETTAGE OF UTERUS     HYSTEROSCOPY W/ ENDOMETRIAL ABLATION     LAPAROSCOPIC TUBAL LIGATION  07/01/2011   Procedure: LAPAROSCOPIC TUBAL LIGATION;  Surgeon: Norleen GAILS Edsel, MD;  Location: [facility];  Service: Gynecology;  Laterality: Bilateral;  Laparoscopic Bilateral Tubal Sterilization with Fallope Rings; ended at 1143   POLYPECTOMY  02/23/2019   Procedure: POLYPECTOMY;  Surgeon: Harvey Margo CROME, MD;  Location: [facility];  Service: Endoscopy;;  cold snare cecal, Hepatic flexure, Splenic flexure, and sigmoid polyps   SUPRACERVICAL ABDOMINAL HYSTERECTOMY N/A 08/04/2017   Procedure: HYSTERECTOMY SUPRACERVICAL ABDOMINAL;  Surgeon: Edsel Norleen GAILS, MD;  Location: [facility];  Service: Gynecology;  Laterality: N/A;   TUBAL LIGATION     UMBILICAL HERNIA REPAIR  07/01/2011   Procedure: HERNIA REPAIR UMBILICAL ADULT;  Surgeon: Norleen GAILS Edsel, MD;  Location: [facility];  Service: Gynecology;  Laterality: N/A;  during Laparoscopic Bilateral Tubal Sterilization   VENTRAL HERNIA REPAIR N/A 08/04/2017   Procedure: HERNIA REPAIR VENTRAL ADULT;  Surgeon: Edsel Norleen GAILS, MD;  Location: [facility];  Service: Gynecology;  Laterality: N/A;    Social History   Socioeconomic History   Marital status: Legally Separated    Spouse name: Not on file   Number of children: Not on file   Years of education:  Not on file   Highest education level: Not on file  Occupational History   Not on file  Tobacco Use   Smoking status: Never   Smokeless tobacco: Never  Vaping Use   Vaping status: Never Used  Substance and Sexual Activity   Alcohol use: No    Alcohol/week: 0.0 standard drinks of alcohol   Drug use: No   Sexual activity: Yes    Birth control/protection: Surgical    Comment: supracervical hyst  Other Topics Concern   Not on file  Social History Narrative   Not on file   Social Drivers of Health   Financial Resource Strain: Low Risk  (02/27/2023)   Overall Financial Resource Strain (CARDIA)    Difficulty of Paying Living Expenses: Not hard at all  Food Insecurity: No Food Insecurity (02/27/2023)   Hunger Vital Sign    Worried About Running Out of  Food in the Last Year: Never true    Ran Out of Food in the Last Year: Never true  Transportation Needs: No Transportation Needs (02/27/2023)   PRAPARE - Administrator, Civil Service (Medical): No    Lack of Transportation (Non-Medical): No  Physical Activity: Sufficiently Active (02/27/2023)   Exercise Vital Sign    Days of Exercise per Week: 5 days    Minutes of Exercise per Session: 60 min  Stress: No Stress Concern Present (02/27/2023)   [employer] of Occupational Health - Occupational Stress Questionnaire    Feeling of Stress : Not at all  Social Connections: Unknown (02/27/2023)   Social Connection and Isolation Panel    Frequency of Communication with Friends and Family: More than three times a week    Frequency of Social Gatherings with Friends and Family: Once a week    Attends Religious Services: More than 4 times per year    Active Member of [employer] or Organizations: Yes    Attends Banker Meetings: More than 4 times per year    Marital Status: Patient declined  Intimate Partner Violence: Not At Risk (02/27/2023)   Humiliation, Afraid, Rape, and Kick questionnaire    Fear of Current or Ex-Partner: No     Emotionally Abused: No    Physically Abused: No    Sexually Abused: No    Dorothyann Koyanagi RN was present throughout the entirety of the encounter.  There were no vitals filed for this visit.    Wt Readings from Last 3 Encounters:  03/26/23 292 lb (132.5 kg)  02/27/23 296 lb 9.6 oz (134.5 kg)  01/28/23 290 lb (131.5 kg)     PHYSICAL EXAM  Affect appropriate Healthy:  appears stated age HEENT: normal Neck supple with no adenopathy JVP normal no bruits no thyromegaly Lungs clear with no wheezing and good diaphragmatic motion Heart:  S1/S2 no murmur, no rub, gallop or click PMI normal Abdomen: benighn, BS positve, no tenderness, no AAA no bruit.  No HSM or HJR Distal pulses intact with no bruits No edema Neuro non-focal Great toes ingrown nail no active edema or erythema No muscular weakness  ECG:  SR rate 65 poor R wave progression   Labs: Lab Results  Component Value Date/Time   K 3.2 (L) 08/05/2017 06:00 AM   BUN 8 08/05/2017 06:00 AM   CREATININE 0.97 08/05/2017 06:00 AM   ALT 24 01/09/2022 09:34 AM   HGB 10.6 (L) 08/05/2017 06:00 AM     Lipids: Lab Results  Component Value Date/Time   LDLCALC 105 (H) 08/04/2012 11:47 AM   LDLDIRECT 125.9 11/01/2012 10:59 AM   CHOL 202 (H) 11/01/2012 10:59 AM   TRIG 165.0 (H) 11/01/2012 10:59 AM   HDL 47.00 11/01/2012 10:59 AM       ASSESSMENT AND PLAN:  1.  Coronary artery disease: History of one-vessel CABG in 2012.  Nuclear stress test was normal in November 2018.  Symptomatically stable.  Continue aspirin , atorvastatin , and Lopressor . Note initial pathology was coronary artery dissection not plaque ETT normal 02/09/23    2.  Hypertension: Well controlled.  Continue current medications and low sodium Dash type diet.     3.  Hyperlipidemia: I will obtain a copy of lipids from PCP.  Continue atorvastatin .  4. Paronychia:  resolved      Disposition: Follow up 1 year   Maude Emmer MD FACC

## 2023-11-26 NOTE — Progress Notes (Deleted)
 SUBJECTIVE: 58 y.o. previously followed by Dr Charls  She has a history of coronary artery disease and underwent 1-vessel CABG (SVG to LAD) in August 2012. This was for a complication of acute coronary dissection after attempted intervention  She also has a history of hypertension.   TTE 02/09/23 reviewed. EF 60-65% no significant valve dx ETT 02/09/23 normal although only reached 82% PMHR    Nuclear stress test was normal on 01/09/2017, EF 69%.  The patient denies any symptoms of chest pain, palpitations, shortness of breath, lightheadedness, dizziness, leg swelling, orthopnea, PND, and syncope.  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 Lives in [place]. year-old daughter (2020) who is completed her masters in Barista and is now working for the [employer].  She has a 7 year old son (2020) who is a Holiday representative in high school in [place] and taking advanced classes.  Review of Systems: As per subjective, otherwise negative.  Allergies  Allergen Reactions   Morphine  And Codeine Rash   Penicillins Rash    Has patient had a PCN reaction causing immediate rash, facial/tongue/throat swelling, SOB or lightheadedness with hypotension: yes Has patient had a PCN reaction causing severe rash involving mucus membranes or skin necrosis: no Has patient had a PCN reaction that required hospitalization: no Has patient had a PCN reaction occurring within the last 10 years: no If all of the above answers are NO, then may proceed with Cephalosporin use.     Current Outpatient Medications  Medication Sig Dispense Refill   amLODipine  (NORVASC ) 10 MG tablet Take 1 tablet (10 mg total) by mouth daily. 90 tablet 3   aspirin  EC 81 MG  tablet Take 1 tablet (81 mg total) by mouth daily.     atorvastatin  (LIPITOR) 20 MG tablet TAKE 1 TABLET DAILY 90 tablet 3   latanoprost (XALATAN) 0.005 % ophthalmic solution Place 1 drop into both eyes at bedtime.  3   lisinopril -hydrochlorothiazide  (PRINZIDE ,ZESTORETIC ) 20-12.5 MG tablet Take 1 tablet by mouth 2 (two) times daily.   3   metoprolol  tartrate (LOPRESSOR ) 25 MG tablet TAKE 1 & 1/2 TABLETS BY MOUTH TWICE A DAY (Patient taking differently: Take 37.5 mg by mouth 2 (two) times daily.) 270 tablet 3   metroNIDAZOLE  (FLAGYL ) 500 MG tablet Take 1 tablet (500 mg total) by mouth 2 (two) times daily. 14 tablet 0   Multiple Vitamin (MULITIVITAMIN WITH MINERALS) TABS Take 1 tablet by mouth daily.     nitroGLYCERIN  (NITROSTAT ) 0.4 MG SL tablet Place 1 tablet (0.4 mg total) under the tongue every 5 (five) minutes as needed. 25 tablet 1   omeprazole  (PRILOSEC) 40 MG capsule Take 40 mg by mouth daily as needed (reflux).  3   polyethylene glycol powder (MIRALAX ) powder Take 17 g by mouth daily. To prevent constipation 255 g prn   No current facility-administered medications for this visit.    Past Medical History:  Diagnosis Date   Angina    Asthma    as a child   Coronary artery dissection    NSTEMI 8/12: cath with mid to dist LAD spont. dissection, unable to do PCI and referred for CABG:  SVG to LAD (Dr. PVT);  Carotid U/S 6/14:  Normal carotid arteries; FMD could not be ruled out   Coronary artery dissection 10/24/2010   Dyslipidemia    GERD (gastroesophageal reflux disease)    Heart disease    Hypertension    Iron deficiency anemia    Obesity    Papanicolaou smear of cervix with positive high risk human papilloma virus (HPV) test 04/11/2020   Pap negative but +HRHPV other, was negative for 16/18, repeat pap in 1 year per [facility] gudielines , 5 year CIN3+ risk is 2.25%    Past Surgical History:  Procedure Laterality Date   BILATERAL SALPINGECTOMY Bilateral 08/04/2017   Procedure:  BILATERAL SALPINGECTOMY;  Surgeon: Edsel Norleen GAILS, MD;  Location: [facility];  Service: Gynecology;  Laterality: Bilateral;   CARDIAC SURGERY     open heart surgery   CERVICAL POLYPECTOMY  07/01/2011   Procedure: CERVICAL POLYPECTOMY;  Surgeon: Norleen GAILS Edsel, MD;  Location: [facility];  Service: Gynecology;  Laterality: N/A;  Endometrial Polypectomy   COLONOSCOPY N/A 02/23/2019   Procedure: COLONOSCOPY;  Surgeon: Harvey Margo CROME, MD;  Location: [facility];  Service: Endoscopy;  Laterality: N/A;  8:30   CORONARY ARTERY BYPASS GRAFT  10/24/2010   CABG X1:  coronary artery dissection -->CABG:  SVG to LAD (Dr. PVT)   DILATION AND CURETTAGE OF UTERUS     HYSTEROSCOPY W/ ENDOMETRIAL ABLATION     LAPAROSCOPIC TUBAL LIGATION  07/01/2011   Procedure: LAPAROSCOPIC TUBAL LIGATION;  Surgeon: Norleen GAILS Edsel, MD;  Location: [facility];  Service: Gynecology;  Laterality: Bilateral;  Laparoscopic Bilateral Tubal Sterilization with Fallope Rings; ended at 1143   POLYPECTOMY  02/23/2019   Procedure: POLYPECTOMY;  Surgeon: Harvey Margo CROME, MD;  Location: [facility];  Service: Endoscopy;;  cold snare cecal, Hepatic flexure, Splenic flexure, and sigmoid polyps   SUPRACERVICAL ABDOMINAL HYSTERECTOMY N/A 08/04/2017   Procedure: HYSTERECTOMY SUPRACERVICAL ABDOMINAL;  Surgeon: Edsel Norleen GAILS, MD;  Location: [facility];  Service: Gynecology;  Laterality: N/A;   TUBAL LIGATION     UMBILICAL HERNIA REPAIR  07/01/2011   Procedure: HERNIA REPAIR UMBILICAL ADULT;  Surgeon: Norleen GAILS Edsel, MD;  Location: [facility];  Service: Gynecology;  Laterality: N/A;  during Laparoscopic Bilateral Tubal Sterilization   VENTRAL HERNIA REPAIR N/A 08/04/2017   Procedure: HERNIA REPAIR VENTRAL ADULT;  Surgeon: Edsel Norleen GAILS, MD;  Location: [facility];  Service: Gynecology;  Laterality: N/A;    Social History   Socioeconomic History   Marital status: Legally Separated    Spouse name: Not on file   Number of children: Not on file   Years of education:  Not on file   Highest education level: Not on file  Occupational History   Not on file  Tobacco Use   Smoking status: Never   Smokeless tobacco: Never  Vaping Use   Vaping status: Never Used  Substance and Sexual Activity   Alcohol use: No    Alcohol/week: 0.0 standard drinks of alcohol   Drug use: No   Sexual activity: Yes    Birth control/protection: Surgical    Comment: supracervical hyst  Other Topics Concern   Not on file  Social History Narrative   Not on file   Social Drivers of Health   Financial Resource Strain: Low Risk  (02/27/2023)   Overall Financial Resource Strain (CARDIA)    Difficulty of Paying Living Expenses: Not hard at all  Food Insecurity: No Food Insecurity (02/27/2023)   Hunger Vital Sign    Worried About Running Out of  Food in the Last Year: Never true    Ran Out of Food in the Last Year: Never true  Transportation Needs: No Transportation Needs (02/27/2023)   PRAPARE - Administrator, Civil Service (Medical): No    Lack of Transportation (Non-Medical): No  Physical Activity: Sufficiently Active (02/27/2023)   Exercise Vital Sign    Days of Exercise per Week: 5 days    Minutes of Exercise per Session: 60 min  Stress: No Stress Concern Present (02/27/2023)   [employer] of Occupational Health - Occupational Stress Questionnaire    Feeling of Stress : Not at all  Social Connections: Unknown (02/27/2023)   Social Connection and Isolation Panel    Frequency of Communication with Friends and Family: More than three times a week    Frequency of Social Gatherings with Friends and Family: Once a week    Attends Religious Services: More than 4 times per year    Active Member of [employer] or Organizations: Yes    Attends Banker Meetings: More than 4 times per year    Marital Status: Patient declined  Intimate Partner Violence: Not At Risk (02/27/2023)   Humiliation, Afraid, Rape, and Kick questionnaire    Fear of Current or Ex-Partner: No     Emotionally Abused: No    Physically Abused: No    Sexually Abused: No    Dorothyann Koyanagi RN was present throughout the entirety of the encounter.  There were no vitals filed for this visit.    Wt Readings from Last 3 Encounters:  03/26/23 292 lb (132.5 kg)  02/27/23 296 lb 9.6 oz (134.5 kg)  01/28/23 290 lb (131.5 kg)     PHYSICAL EXAM  Affect appropriate Healthy:  appears stated age HEENT: normal Neck supple with no adenopathy JVP normal no bruits no thyromegaly Lungs clear with no wheezing and good diaphragmatic motion Heart:  S1/S2 no murmur, no rub, gallop or click PMI normal Abdomen: benighn, BS positve, no tenderness, no AAA no bruit.  No HSM or HJR Distal pulses intact with no bruits No edema Neuro non-focal Great toes ingrown nail no active edema or erythema No muscular weakness  ECG:  SR rate 65 poor R wave progression   Labs: Lab Results  Component Value Date/Time   K 3.2 (L) 08/05/2017 06:00 AM   BUN 8 08/05/2017 06:00 AM   CREATININE 0.97 08/05/2017 06:00 AM   ALT 24 01/09/2022 09:34 AM   HGB 10.6 (L) 08/05/2017 06:00 AM     Lipids: Lab Results  Component Value Date/Time   LDLCALC 105 (H) 08/04/2012 11:47 AM   LDLDIRECT 125.9 11/01/2012 10:59 AM   CHOL 202 (H) 11/01/2012 10:59 AM   TRIG 165.0 (H) 11/01/2012 10:59 AM   HDL 47.00 11/01/2012 10:59 AM       ASSESSMENT AND PLAN:  1.  Coronary artery disease: History of one-vessel CABG in 2012.  Nuclear stress test was normal in November 2018.  Symptomatically stable.  Continue aspirin , atorvastatin , and Lopressor . Note initial pathology was coronary artery dissection not plaque ETT normal 02/09/23    2.  Hypertension: Well controlled.  Continue current medications and low sodium Dash type diet.     3.  Hyperlipidemia: I will obtain a copy of lipids from PCP.  Continue atorvastatin .  4. Paronychia:  resolved      Disposition: Follow up 1 year   Maude Emmer MD FACC

## 2023-11-27 ENCOUNTER — Ambulatory Visit: Admitting: Cardiovascular Disease

## 2023-11-27 ENCOUNTER — Ambulatory Visit: Attending: Cardiovascular Disease | Admitting: Cardiovascular Disease

## 2024-01-01 ENCOUNTER — Other Ambulatory Visit: Payer: Self-pay | Admitting: Adult Health

## 2024-02-15 ENCOUNTER — Ambulatory Visit

## 2024-03-01 NOTE — Progress Notes (Deleted)
 " Cardiology Office Note:  .   Date:  03/01/2024  ID:  Ashlee Stein, DOB 07/05/1965, MRN [id number] PCP: Carlette Benita Area, MD  [facility] Providers Cardiologist:  Maude Emmer, MD {  History of Present Illness: .   Ashlee Stein is a 59 y.o. female  with PMHx of  CAD, s/p one-vessel CABG (SVG-LAD) in August 2012, hypertension, hyperlipidemia, morbid obesity, leg edema, paronychia, who reports to [facility] office for follow up.   Pertinent cardiac medical history:  Lexiscan  12/2016: Normal, low risk study ETT 01/2023: Achieved 82% of THR, no evidence of ischemia for level of exertion achieved.  Last seen in heartcare 02/2023 by Almarie Crate, NP for follow-up.  Noted that she could not complete ETT due becoming hot.  See full report below.  Otherwise doing well from cardiac standpoint without any complaints.  No med changes.  Discussed GLP-1 as requested by patient and noted awaiting labs from PCP.  Continued on ASA 81 mg, Lipitor 20 mg daily, NTG as needed, amlodipine  10 mg daily, lisinopril -HCTZ 20-12.5 mg twice daily, Lopressor  37.5 mg twice daily  Recent hospitalization   HIGH NO SHOW?  Today, reports ### and denies ###.  Denies chest pain, shortness of breath, palpitations, syncope, presyncope, dizziness, orthopnea, PND, swelling or significant weight changes, acute bleeding, or claudication.   Reports compliance with medications.  Dietary habitats:  Activity level:  Social: Denies tobacco use/alcohol/drug use  Denies any recent hospitalizations or visits to the emergency department.  Coronary artery disease involving native heart without angina pectoris, unspecified vessel or lesion type Hyperlipidemia LDL goal <55 Denies angina symptoms. No need for further ischemic evaluation at this time.  LDL? LFT? Continue ASA 81 mg, Lipitor 20 mg daily, NTG as needed, amlodipine  10 mg daily, lisinopril -HCTZ 20-12.5 mg twice daily, Lopressor  37.5 mg twice daily  Lipid Panel      Component Value Date/Time   CHOL 202 (H) 11/01/2012 1059   TRIG 165.0 (H) 11/01/2012 1059   HDL 47.00 11/01/2012 1059   CHOLHDL 4 11/01/2012 1059   VLDL 33.0 11/01/2012 1059   LDLCALC 105 (H) 08/04/2012 1147   LDLDIRECT 125.9 11/01/2012 1059      Latest Ref Rng & Units 08/05/2017    6:00 AM 07/30/2017   11:05 AM 09/18/2016    3:00 PM  BMP  Glucose 65 - 99 mg/dL 884  874  880   BUN 6 - 20 mg/dL 8  11  11    Creatinine 0.44 - 1.00 mg/dL 9.02  8.99  8.85   Sodium 135 - 145 mmol/L 139  137  137   Potassium 3.5 - 5.1 mmol/L 3.2  3.3  3.5   Chloride 101 - 111 mmol/L 103  104  101   CO2 22 - 32 mmol/L 28  26  24    Calcium  8.9 - 10.3 mg/dL 8.0  8.8  9.1    Essential hypertension, benign Reports well controlled Home BP:  BP this OV well controlled today:  Continue on amlodipine  10 mg daily, lisinopril -HCTZ 20-12.5 mg twice daily, Lopressor  37.5 mg twice daily Encourage physical activity for 150 minutes per week and heart healthy low sodium diet. Discussed limiting sodium intake to < 2 grams daily.    Morbid obesity (HCC) ***    ROS: 10 point review of system has been reviewed and considered negative except ones been listed in the HPI.   Studies Reviewed: .   Lexiscan  12/2016: Blood pressure demonstrated a normal response to exercise. There was  no ST segment deviation noted during stress. The study is normal. This is a low risk study. Nuclear stress EF: 69%.  ETT 01/2023:      No ST deviation was noted.   Patient just missed reaching heart rate target  (achieved 82% of THR). This lowers the sensitivity of exercise stress testing to detect ischemia   No evidence of ischemia for level of exertion achieved. If high suspicion of ischemic disease consider stress pharmacological  imaging either SPECT or PET.  CV Studies: Cardiac studies reviewed are outlined and summarized above. Otherwise please see EMR for full report.   Risk Assessment/Calculations:   {Does this patient have ATRIAL  FIBRILLATION?:[phone]} No BP recorded.  {Refresh Note OR Click here to enter BP  :1}***       Physical Exam:   VS:  LMP 07/23/2017 Comment: [facility]   Wt Readings from Last 3 Encounters:  03/26/23 292 lb (132.5 kg)  02/27/23 296 lb 9.6 oz (134.5 kg)  01/28/23 290 lb (131.5 kg)    GEN: Well nourished, well developed in no acute distress while sitting in chair.  NECK: No JVD; No carotid bruits CARDIAC: ***RRR, no murmurs, rubs, gallops RESPIRATORY:  Clear to auscultation without rales, wheezing or rhonchi  ABDOMEN: Soft, non-tender, non-distended EXTREMITIES:  No edema; No deformity   ASSESSMENT AND PLAN: .   ***    {Are you ordering a CV Procedure (e.g. stress test, cath, DCCV, TEE, etc)?   Press F2        :[id number]}  Dispo: ***  Signed, Lorette CINDERELLA Kapur, PA-C  "

## 2024-03-02 ENCOUNTER — Ambulatory Visit: Admitting: Physician Assistant

## 2024-03-02 DIAGNOSIS — I10 Essential (primary) hypertension: Secondary | ICD-10-CM

## 2024-03-02 DIAGNOSIS — E785 Hyperlipidemia, unspecified: Secondary | ICD-10-CM

## 2024-03-02 DIAGNOSIS — I2510 Atherosclerotic heart disease of native coronary artery without angina pectoris: Secondary | ICD-10-CM

## 2024-04-01 ENCOUNTER — Encounter (INDEPENDENT_AMBULATORY_CARE_PROVIDER_SITE_OTHER): Payer: Self-pay | Admitting: *Deleted

## 2024-04-14 ENCOUNTER — Ambulatory Visit: Admitting: Adult Health

## 2024-04-21 ENCOUNTER — Ambulatory Visit: Admitting: Adult Health

## 2024-04-29 ENCOUNTER — Ambulatory Visit: Admitting: Cardiovascular Disease
